# Patient Record
Sex: Male | Born: 1985 | Hispanic: No | Marital: Married | Smoking: Never smoker
Health system: Southern US, Community
[De-identification: ages and names within clinical notes are randomized; demographics above are authoritative.]

## PROBLEM LIST (undated history)

## (undated) DIAGNOSIS — I160 Hypertensive urgency: Secondary | ICD-10-CM

## (undated) DIAGNOSIS — E119 Type 2 diabetes mellitus without complications: Secondary | ICD-10-CM

## (undated) DIAGNOSIS — I10 Essential (primary) hypertension: Secondary | ICD-10-CM

## (undated) DIAGNOSIS — N471 Phimosis: Secondary | ICD-10-CM

## (undated) HISTORY — PX: TONSILLECTOMY: SUR1361

## (undated) HISTORY — PX: CIRCUMCISION: SUR203

---
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
## 2009-03-09 ENCOUNTER — Emergency Department (HOSPITAL_COMMUNITY): Admission: EM | Admit: 2009-03-09 | Discharge: 2009-03-09 | Payer: Self-pay | Admitting: Emergency Medicine

## 2011-03-07 LAB — DIFFERENTIAL
Basophils Relative: 1 % (ref 0–1)
Eosinophils Absolute: 0.1 10*3/uL (ref 0.0–0.7)
Lymphs Abs: 1.9 10*3/uL (ref 0.7–4.0)
Neutrophils Relative %: 77 % (ref 43–77)

## 2011-03-07 LAB — BASIC METABOLIC PANEL
BUN: 11 mg/dL (ref 6–23)
Chloride: 99 mEq/L (ref 96–112)
Creatinine, Ser: 1 mg/dL (ref 0.4–1.5)

## 2011-03-07 LAB — URINALYSIS, ROUTINE W REFLEX MICROSCOPIC
Glucose, UA: NEGATIVE mg/dL
Protein, ur: 30 mg/dL — AB
Urobilinogen, UA: 1 mg/dL (ref 0.0–1.0)

## 2011-03-07 LAB — URINE MICROSCOPIC-ADD ON

## 2011-03-07 LAB — CBC
MCV: 87 fL (ref 78.0–100.0)
Platelets: 174 10*3/uL (ref 150–400)
WBC: 11.4 10*3/uL — ABNORMAL HIGH (ref 4.0–10.5)

## 2016-12-31 ENCOUNTER — Ambulatory Visit: Payer: Self-pay | Admitting: Endocrinology

## 2017-04-03 ENCOUNTER — Emergency Department (HOSPITAL_COMMUNITY)
Admission: EM | Admit: 2017-04-03 | Discharge: 2017-04-03 | Disposition: A | Discharge status: Home / Self Care | Payer: Self-pay | Attending: Emergency Medicine | Admitting: Emergency Medicine

## 2017-04-03 ENCOUNTER — Encounter (HOSPITAL_COMMUNITY): Payer: Self-pay | Admitting: Emergency Medicine

## 2017-04-03 DIAGNOSIS — R739 Hyperglycemia, unspecified: Secondary | ICD-10-CM

## 2017-04-03 DIAGNOSIS — N471 Phimosis: Secondary | ICD-10-CM | POA: Insufficient documentation

## 2017-04-03 DIAGNOSIS — E1165 Type 2 diabetes mellitus with hyperglycemia: Secondary | ICD-10-CM | POA: Insufficient documentation

## 2017-04-03 DIAGNOSIS — I10 Essential (primary) hypertension: Secondary | ICD-10-CM | POA: Insufficient documentation

## 2017-04-03 HISTORY — DX: Essential (primary) hypertension: I10

## 2017-04-03 HISTORY — DX: Type 2 diabetes mellitus without complications: E11.9

## 2017-04-03 LAB — CBG MONITORING, ED
Glucose-Capillary: 344 mg/dL — ABNORMAL HIGH (ref 65–99)
Glucose-Capillary: 310 mg/dL — ABNORMAL HIGH (ref 65–99)

## 2017-04-03 MED ORDER — BETAMETHASONE DIPROPIONATE 0.05 % EX OINT: TOPICAL_OINTMENT | Freq: Two times a day (BID) | CUTANEOUS | 0 refills | Status: DC

## 2017-04-03 MED ORDER — METFORMIN HCL 500 MG PO TABS: 500.0000 mg | ORAL_TABLET | Freq: Two times a day (BID) | ORAL | 0 refills | Status: DC

## 2017-04-03 NOTE — ED Provider Notes (Signed)
[facility]-EMERGENCY DEPT Provider Note   CSN: [id number] Arrival date & time: 04/03/17  1738     History   Chief Complaint Chief Complaint  Patient presents with  . Penile Discharge    HPI Anthony Hubbard is a 31 y.o. male.  31 year old male with history of hypertension as well as type 2 diabetes, noncompliant with her diabetic diet, presents with long-standing history of pain to his foreskin. He is uncircumcised and denies any dysuria. No fever or chills. Denies any purulent drainage. Is able to pass urine without difficult to. Has had some polyuria and polydipsia. Denies any weakness.      Past Medical History:  Diagnosis Date  . Diabetes mellitus without complication (HCC)    type 2   . Hypertension     There are no active problems to display for this patient.   Past Surgical History:  Procedure Laterality Date  . TONSILLECTOMY         Home Medications    Prior to Admission medications   Not on File    Family History No family history on file.  Social History Social History  Substance Use Topics  . Smoking status: Never Smoker  . Smokeless tobacco: Never Used  . Alcohol use No     Allergies   Patient has no known allergies.   Review of Systems Review of Systems  All other systems reviewed and are negative.    Physical Exam Updated Vital Signs BP (!) 154/101 (BP Location: Right Arm)   Pulse 99   Temp 98.8 F (37.1 C) (Oral)   Resp 18   Ht 5\' 9"  (1.753 m)   Wt 101.6 kg   SpO2 97%   BMI 33.08 kg/m   Physical Exam  Constitutional: He is oriented to person, place, and time. He appears well-developed and well-nourished.  Non-toxic appearance. No distress.  HENT:  Head: Normocephalic and atraumatic.  Eyes: Conjunctivae, EOM and lids are normal. Pupils are equal, round, and reactive to light.  Neck: Normal range of motion. Neck supple. No tracheal deviation present. No thyroid mass present.  Cardiovascular: Normal rate, regular rhythm and  normal heart sounds.  Exam reveals no gallop.   No murmur heard. Pulmonary/Chest: Effort normal and breath sounds normal. No stridor. No respiratory distress. He has no decreased breath sounds. He has no wheezes. He has no rhonchi. He has no rales.  Abdominal: Soft. Normal appearance and bowel sounds are normal. He exhibits no distension. There is no tenderness. There is no rebound and no CVA tenderness.  Genitourinary: Uncircumcised. Phimosis and penile tenderness present. No penile erythema. No discharge found.  Musculoskeletal: Normal range of motion. He exhibits no edema or tenderness.  Neurological: He is alert and oriented to person, place, and time. He has normal strength. No cranial nerve deficit or sensory deficit. GCS eye subscore is 4. GCS verbal subscore is 5. GCS motor subscore is 6.  Skin: Skin is warm and dry. No abrasion and no rash noted.  Psychiatric: He has a normal mood and affect. His speech is normal and behavior is normal.  Nursing note and vitals reviewed.    ED Treatments / Results  Labs (all labs ordered are listed, but only abnormal results are displayed) Labs Reviewed  CBG MONITORING, ED - Abnormal; Notable for the following:       Result Value   Glucose-Capillary 344 (*)    All other components within normal limits  CBG MONITORING, ED - Abnormal; Notable for the following:  Glucose-Capillary 310 (*)    All other components within normal limits    EKG  EKG Interpretation None       Radiology No results found.  Procedures Procedures (including critical care time)  Medications Ordered in ED Medications - No data to display   Initial Impression / Assessment and Plan / ED Course  I have reviewed the triage vital signs and the nursing notes.  Pertinent labs & imaging results that were available during my care of the patient were reviewed by me and considered in my medical decision making (see chart for details).     Patient evidence of  phimosis. Will prescribe betamethasone cream and give urological referral. We'll also adjust patient's dose of his metformin because his hyperglycemia and patient counseled on proper diabetic diet.  Final Clinical Impressions(s) / ED Diagnoses   Final diagnoses:  None    New Prescriptions New Prescriptions   No medications on file     Lorre NickAllen, Bodin Gorka, MD 04/03/17 2157

## 2017-04-03 NOTE — ED Triage Notes (Signed)
Pt c/o white discharge progressed to red-green discharge from underneath foreskin, itch, with small bleeding cuts to distal edge of foreskin x several months, foreskin rips slightly when pt tries to retract it for cleaning. Last sexual intercourse 2 years ago. No injury to penis. No dysuria except burning where urine touches open skin.

## 2017-06-12 DIAGNOSIS — E1169 Type 2 diabetes mellitus with other specified complication: Secondary | ICD-10-CM | POA: Insufficient documentation

## 2017-11-20 DIAGNOSIS — E781 Pure hyperglyceridemia: Secondary | ICD-10-CM | POA: Insufficient documentation

## 2019-06-02 ENCOUNTER — Emergency Department (HOSPITAL_BASED_OUTPATIENT_CLINIC_OR_DEPARTMENT_OTHER): Payer: Self-pay

## 2019-06-02 ENCOUNTER — Other Ambulatory Visit: Payer: Self-pay

## 2019-06-02 ENCOUNTER — Emergency Department (HOSPITAL_COMMUNITY)
Admission: EM | Admit: 2019-06-02 | Discharge: 2019-06-02 | Disposition: A | Discharge status: Home / Self Care | Payer: Self-pay | Attending: Emergency Medicine | Admitting: Emergency Medicine

## 2019-06-02 ENCOUNTER — Encounter (HOSPITAL_COMMUNITY): Payer: Self-pay

## 2019-06-02 ENCOUNTER — Emergency Department (HOSPITAL_COMMUNITY): Payer: Self-pay

## 2019-06-02 DIAGNOSIS — I10 Essential (primary) hypertension: Secondary | ICD-10-CM | POA: Insufficient documentation

## 2019-06-02 DIAGNOSIS — Z9114 Patient's other noncompliance with medication regimen: Secondary | ICD-10-CM | POA: Insufficient documentation

## 2019-06-02 DIAGNOSIS — E119 Type 2 diabetes mellitus without complications: Secondary | ICD-10-CM | POA: Insufficient documentation

## 2019-06-02 DIAGNOSIS — R52 Pain, unspecified: Secondary | ICD-10-CM

## 2019-06-02 DIAGNOSIS — Z7984 Long term (current) use of oral hypoglycemic drugs: Secondary | ICD-10-CM | POA: Insufficient documentation

## 2019-06-02 DIAGNOSIS — L03116 Cellulitis of left lower limb: Secondary | ICD-10-CM

## 2019-06-02 DIAGNOSIS — M79605 Pain in left leg: Secondary | ICD-10-CM

## 2019-06-02 DIAGNOSIS — M7989 Other specified soft tissue disorders: Secondary | ICD-10-CM

## 2019-06-02 LAB — COMPREHENSIVE METABOLIC PANEL
ALT: 27 U/L (ref 0–44)
AST: 14 U/L — ABNORMAL LOW (ref 15–41)
Albumin: 4.3 g/dL (ref 3.5–5.0)
Alkaline Phosphatase: 89 U/L (ref 38–126)
Anion gap: 13 (ref 5–15)
BUN: 13 mg/dL (ref 6–20)
CO2: 23 mmol/L (ref 22–32)
Calcium: 9.5 mg/dL (ref 8.9–10.3)
Chloride: 99 mmol/L (ref 98–111)
Creatinine, Ser: 0.91 mg/dL (ref 0.61–1.24)
GFR calc Af Amer: >60 mL/min (ref >60)
GFR calc non Af Amer: >60 mL/min (ref >60)
Glucose, Bld: 352 mg/dL — ABNORMAL HIGH (ref 70–99)
Potassium: 4.2 mmol/L (ref 3.5–5.1)
Sodium: 135 mmol/L (ref 135–145)
Total Bilirubin: 0.7 mg/dL (ref 0.3–1.2)
Total Protein: 8.4 g/dL — ABNORMAL HIGH (ref 6.5–8.1)

## 2019-06-02 LAB — CBC WITH DIFFERENTIAL/PLATELET
Abs Immature Granulocytes: 0.05 10*3/uL (ref 0.00–0.07)
Basophils Absolute: 0 10*3/uL (ref 0.0–0.1)
Basophils Relative: 0 %
Eosinophils Absolute: 0.1 10*3/uL (ref 0.0–0.5)
Eosinophils Relative: 1 %
HCT: 53.2 % — ABNORMAL HIGH (ref 39.0–52.0)
Hemoglobin: 17.4 g/dL — ABNORMAL HIGH (ref 13.0–17.0)
Immature Granulocytes: 0 %
Lymphocytes Relative: 23 %
Lymphs Abs: 2.7 10*3/uL (ref 0.7–4.0)
MCH: 28.2 pg (ref 26.0–34.0)
MCHC: 32.7 g/dL (ref 30.0–36.0)
MCV: 86.4 fL (ref 80.0–100.0)
Monocytes Absolute: 0.8 10*3/uL (ref 0.1–1.0)
Monocytes Relative: 7 %
Neutro Abs: 7.9 10*3/uL — ABNORMAL HIGH (ref 1.7–7.7)
Neutrophils Relative %: 69 %
Platelets: 230 10*3/uL (ref 150–400)
RBC: 6.16 MIL/uL — ABNORMAL HIGH (ref 4.22–5.81)
RDW: 12.5 % (ref 11.5–15.5)
WBC: 11.6 10*3/uL — ABNORMAL HIGH (ref 4.0–10.5)
nRBC: 0 % (ref 0.0–0.2)

## 2019-06-02 MED ORDER — GADOBUTROL 1 MMOL/ML IV SOLN
10.0000 mL | Freq: Once | INTRAVENOUS | Status: AC | PRN
  Administered 2019-06-02: 10 mL via INTRAVENOUS

## 2019-06-02 MED ORDER — MORPHINE SULFATE (PF) 4 MG/ML IV SOLN
4.0000 mg | Freq: Once | INTRAVENOUS | Status: AC
  Administered 2019-06-02: 4 mg via INTRAVENOUS
  Filled 2019-06-02: qty 1

## 2019-06-02 MED ORDER — OXYCODONE-ACETAMINOPHEN 5-325 MG PO TABS: 1.0000 | ORAL_TABLET | Freq: Four times a day (QID) | ORAL | 0 refills | Status: DC | PRN

## 2019-06-02 MED ORDER — SODIUM CHLORIDE 0.9 % IV BOLUS
1000.0000 mL | Freq: Once | INTRAVENOUS | Status: AC
  Administered 2019-06-02: 1000 mL via INTRAVENOUS

## 2019-06-02 MED ORDER — ONDANSETRON HCL 4 MG/2ML IJ SOLN
4.0000 mg | Freq: Once | INTRAMUSCULAR | Status: AC
  Administered 2019-06-02: 15:00:00 4 mg via INTRAVENOUS
  Filled 2019-06-02: qty 2

## 2019-06-02 NOTE — ED Provider Notes (Addendum)
[facility]-EMERGENCY DEPT Provider Note   CSN: [id number] Arrival date & time: 06/02/19  1220   History   Chief Complaint Chief Complaint  Patient presents with  . Leg Pain    HPI Anthony Hubbard is a 33 y.o. male with past history significant for type 2 diabetes who is noncompliant with his medications and hypertension who presents for evaluation of left lower extremity swelling and warmth.  This began proximally 1 month ago.  Patient states originally he had a knot to his medial left gastroc.  Patient states this not resolved however then he developed a diffuse rash which resemble petechiae.  Patient states that then resolved however he developed diffuse bruising as well as tenderness.  Patient states the bruising has dissipated however the area has become erythematous and mildly warm.  He denies prior history of DVT or PE.  Denies fever, chills, nausea, vomiting, chest pain, shortness of breath, abdominal pain, diarrhea, dysuria, numbness or tingling in his extremities.  Patient states he has some mild pain to his medial belly of the left calf however no posterior calf tenderness.  Denies any recent injuries or trauma.  Has been taken 1 dose of Augmentin yesterday which was prescribed by the diabetes clinic.  Is been taking Tylenol and ibuprofen without relief of symptoms. Has had pain with ambulation to LLE.  History obtained from patient and past medical records.  No interpreter was used.     HPI  Past Medical History:  Diagnosis Date  . Diabetes mellitus without complication (HCC)    type 2   . Hypertension     There are no active problems to display for this patient.   Past Surgical History:  Procedure Laterality Date  . TONSILLECTOMY          Home Medications    Prior to Admission medications   Medication Sig Start Date End Date Taking? Authorizing Provider  betamethasone dipropionate (DIPROLENE) 0.05 % ointment Apply topically 2 (two) times  daily. 04/03/17   Lorre NickAllen, Anthony, MD  metFORMIN (GLUCOPHAGE) 500 MG tablet Take 1 tablet (500 mg total) by mouth 2 (two) times daily. Take 1 tablet in the morning and 2 tablets in the evening 04/03/17   Lorre NickAllen, Anthony, MD    Family History No family history on file.  Social History Social History   Tobacco Use  . Smoking status: Never Smoker  . Smokeless tobacco: Never Used  Substance Use Topics  . Alcohol use: No  . Drug use: Yes    Types: Marijuana     Allergies   Patient has no known allergies.   Review of Systems Review of Systems  Constitutional: Negative.   HENT: Negative.   Respiratory: Negative.   Cardiovascular: Negative.   Gastrointestinal: Negative.   Genitourinary: Negative.   Musculoskeletal: Negative for back pain and gait problem.       LLE pain and swelling  Skin: Positive for rash.  Neurological: Negative.   All other systems reviewed and are negative.   Physical Exam Updated Vital Signs BP (!) 186/113 (BP Location: Right Arm)   Pulse 83   Temp 99.4 F (37.4 C) (Oral)   Resp 16   SpO2 98%   Physical Exam Vitals signs and nursing note reviewed.  Constitutional:      General: He is not in acute distress.    Appearance: He is not ill-appearing, toxic-appearing or diaphoretic.  HENT:     Head: Normocephalic and atraumatic.     Jaw: There  is normal jaw occlusion.     Nose: Nose normal.     Comments: Clear rhinorrhea and congestion to bilateral nares.  No sinus tenderness.    Mouth/Throat:     Mouth: Mucous membranes are moist.     Pharynx: Oropharynx is clear.     Comments: Posterior oropharynx clear.  Mucous membranes moist.  Tonsils without erythema or exudate.  Uvula midline without deviation.  No evidence of PTA or RPA.  No drooling, dysphasia or trismus.  Phonation normal. Neck:     Trachea: Trachea and phonation normal.     Meningeal: Brudzinski's sign and Kernig's sign absent.     Comments: No Neck stiffness or neck rigidity.  No  meningismus.  No cervical lymphadenopathy. Cardiovascular:     Pulses:          Dorsalis pedis pulses are 2+ on the right side and 2+ on the left side.       Posterior tibial pulses are 2+ on the right side and 2+ on the left side.     Comments: No murmurs rubs or gallops. Pulmonary:     Comments: Clear to auscultation bilaterally without wheeze, rhonchi or rales.  No accessory muscle usage.  Able speak in full sentences. Abdominal:     Comments: Soft, nontender without rebound or guarding.  No CVA tenderness.  Musculoskeletal:     Comments: Noticed to medial gastroc to left lower extremity.  There is mild surrounding warmth.  Patient able to plantarflex and dorsiflex however with mild pain.  No pain to posterior calf however tenderness to medial belly.  Mild surrounding erythema extending from foot to wound.  There is area of induration without fluctuance.  No bony step-offs or crepitus.  He has 2+ DP, PT pulses bilaterally.  Left lower extremity with full range of motion without any skin changes.  Bilateral lower extremity compartments soft.  Skin:    Comments: Brisk capillary refill.  Erythema with mild warmth to medial belly of left gastroc.  Mild induration without fluctuance.  No evidence of obvious drainable abscess.  Neurological:     Mental Status: He is alert.     Comments: Ambulatory in department without difficulty.  Cranial nerves II through XII grossly intact.  No facial droop.  No aphasia.      ED Treatments / Results  Labs (all labs ordered are listed, but only abnormal results are displayed) Labs Reviewed  CBC WITH DIFFERENTIAL/PLATELET - Abnormal; Notable for the following components:      Result Value   WBC 11.6 (*)    RBC 6.16 (*)    Hemoglobin 17.4 (*)    HCT 53.2 (*)    Neutro Abs 7.9 (*)    All other components within normal limits  COMPREHENSIVE METABOLIC PANEL    EKG None  Radiology No results found.  Procedures   EMERGENCY DEPARTMENT US SOFT  TISSUE INTERPRETATION "Study: Limited Soft Tissue Ultrasound"  INDICATIONS: Pain Multiple views of the body part were obtained in real-time with a multi-frequency linear probe  PERFORMED BY: Myself IMAGES ARCHIVED?: Yes SIDE:Left BODY PART:Lower extremity INTERPRETATION:  Cellulitis present   Procedures (including critical care time)  Medications Ordered in ED Medications  morphine 4 MG/ML injection 4 mg (4 mg Intravenous Given 06/02/19 1519)  sodium chloride 0.9 % bolus 1,000 mL (1,000 mLs Intravenous New Bag/Given 06/02/19 1517)  ondansetron (ZOFRAN) injection 4 mg (4 mg Intravenous Given 06/02/19 1518)    Initial Impression / Assessment and Plan / ED Course  I have reviewed the triage vital signs and the nursing notes.  Pertinent labs & imaging results that were available during my care of the patient were reviewed by me and considered in my medical decision making (see chart for details).   33 year old male appears otherwise well presents for evaluation of left lower extremity pain.  He is afebrile, nonseptic, non-ill-appearing.  Patient is noncompliant with his diabetes medicines.  Patient has had evolving pain, swelling and ecchymosis over the last month to his left medial belly of his calf.  He has 2+, DP pulses bilaterally.  His compartments are soft.  He has no bony tenderness.  Ultrasound performed does not show any evidence of drainable abscess.  He does have some mild cobblestoning to the area.  Concern for possible hematoma vs infectious process vs possible ligament or tendon injury.  No systemic symptoms to indicate Sirs or sepsis.  We will plan to obtain basic labs as well as MRI with contrast and [place] to r/o DVT as cause of his pain.   Care transferred to Dr. Melina Copa who will follow up on labs, ultrasound and MRI.  If negative likely DC home to continue antibiotics prescribed by previous provider.  Patient seen and evaluated by my attending physician, Dr. Vanita Panda who agrees  with above treatment and plan. Final Clinical Impressions(s) / ED Diagnoses   Final diagnoses:  Left leg pain    ED Discharge Orders    None       Shadow Stiggers A, PA-C 06/02/19 1549    Yunique Dearcos A, PA-C 06/02/19 1613    Carmin Muskrat, MD 06/08/19 1400

## 2019-06-02 NOTE — Progress Notes (Signed)
Left lower extremity venous duplex completed. Refer to "CV Proc" under chart review to view preliminary results.  Preliminary results discussed with Britni, PA-C.  06/02/2019 4:24 PM Maudry Mayhew, MHA, RVT, RDCS, RDMS

## 2019-06-02 NOTE — ED Provider Notes (Signed)
Signout from [place].  33 year old diabetic male with 1 month of left calf pain swelling bruising.  He was just started on antibiotics Augmentin is just had 1 dose.  No other systemic symptoms. Physical Exam  BP (!) 186/113 (BP Location: Right Arm)   Pulse 83   Temp 99.4 F (37.4 C) (Oral)   Resp 16   SpO2 98%   Physical Exam  ED Course/Procedures   Clinical Course as of Jun 01 2329  Tue Jun 02, 2019  1932 Patient's MRI read and shows concerns for cellulitis and may be myositis, they said fasciitis was also possibility although they are not seeing any signs of air.  The fact that this is been going on for almost a month makes me think that fasciitis is less likely.  He is only had 1 dose of antibiotics.  He is a diabetic but otherwise is very nontoxic-appearing.  Pain seems to be his main complaint.  Will cover with some pain medicine and have him take his antibiotics.  Muscle again given the number for orthopedics for follow-up but I given clear instructions to return if any worsening symptoms.   [MB]    Clinical Course User Index [MB] Hayden Rasmussen, MD    Procedures  MDM  Plan is to follow-up on labs and MRI of the lower leg.  If no significant findings can be discharged to continue on antibiotics and follow-up with diabetic clinic.       Hayden Rasmussen, MD 06/02/19 [phone]

## 2019-06-02 NOTE — Discharge Instructions (Signed)
You were seen in the emergency department for 1 month of increased pain and swelling of your left leg.  You had blood work, ultrasound, and an MRI of your leg.  It still not clear exactly what is causing her symptoms but there is at least some evidence of an infection.  Please continue your antibiotics.  It will be important for you to follow-up with orthopedics and please return if any worsening symptoms.

## 2019-06-02 NOTE — ED Triage Notes (Signed)
Pt states since June 13, pt had charlie horse in left leg. Pt states that he has bruising.  Pt states that has since gone away. Pt states he has a large bump on his left lower leg. Pt went to diabetic clinic, who told him it was an infection. Pt was given rx for abx 2 days ago.  Pt had x rays done at that time and they were negative.

## 2019-06-02 NOTE — ED Notes (Signed)
Patient transported to MRI 

## 2019-06-07 ENCOUNTER — Inpatient Hospital Stay (HOSPITAL_COMMUNITY)
Admission: EM | Admit: 2019-06-07 | Discharge: 2019-06-15 | DRG: 854 | Disposition: A | Discharge status: Home / Self Care | Payer: Self-pay | Attending: Internal Medicine | Admitting: Internal Medicine

## 2019-06-07 ENCOUNTER — Other Ambulatory Visit: Payer: Self-pay

## 2019-06-07 ENCOUNTER — Encounter (HOSPITAL_COMMUNITY): Payer: Self-pay | Admitting: Emergency Medicine

## 2019-06-07 DIAGNOSIS — L03116 Cellulitis of left lower limb: Secondary | ICD-10-CM | POA: Diagnosis present

## 2019-06-07 DIAGNOSIS — Z833 Family history of diabetes mellitus: Secondary | ICD-10-CM

## 2019-06-07 DIAGNOSIS — E1165 Type 2 diabetes mellitus with hyperglycemia: Secondary | ICD-10-CM

## 2019-06-07 DIAGNOSIS — A419 Sepsis, unspecified organism: Principal | ICD-10-CM | POA: Diagnosis present

## 2019-06-07 DIAGNOSIS — Z20828 Contact with and (suspected) exposure to other viral communicable diseases: Secondary | ICD-10-CM

## 2019-06-07 DIAGNOSIS — Z79891 Long term (current) use of opiate analgesic: Secondary | ICD-10-CM

## 2019-06-07 DIAGNOSIS — M60004 Infective myositis, unspecified left leg: Secondary | ICD-10-CM

## 2019-06-07 DIAGNOSIS — M7989 Other specified soft tissue disorders: Secondary | ICD-10-CM | POA: Diagnosis present

## 2019-06-07 DIAGNOSIS — I160 Hypertensive urgency: Secondary | ICD-10-CM

## 2019-06-07 DIAGNOSIS — Z794 Long term (current) use of insulin: Secondary | ICD-10-CM

## 2019-06-07 DIAGNOSIS — M60009 Infective myositis, unspecified site: Secondary | ICD-10-CM | POA: Diagnosis present

## 2019-06-07 DIAGNOSIS — Z791 Long term (current) use of non-steroidal anti-inflammatories (NSAID): Secondary | ICD-10-CM

## 2019-06-07 DIAGNOSIS — Z7982 Long term (current) use of aspirin: Secondary | ICD-10-CM

## 2019-06-07 DIAGNOSIS — Z7984 Long term (current) use of oral hypoglycemic drugs: Secondary | ICD-10-CM

## 2019-06-07 DIAGNOSIS — I82819 Embolism and thrombosis of superficial veins of unspecified lower extremities: Secondary | ICD-10-CM | POA: Diagnosis present

## 2019-06-07 DIAGNOSIS — M729 Fibroblastic disorder, unspecified: Secondary | ICD-10-CM | POA: Diagnosis present

## 2019-06-07 DIAGNOSIS — L02416 Cutaneous abscess of left lower limb: Secondary | ICD-10-CM | POA: Diagnosis present

## 2019-06-07 DIAGNOSIS — I10 Essential (primary) hypertension: Secondary | ICD-10-CM

## 2019-06-07 HISTORY — DX: Phimosis: N47.1

## 2019-06-07 HISTORY — DX: Hypertensive urgency: I16.0

## 2019-06-07 LAB — SEDIMENTATION RATE: Sed Rate: 74 mm/hr — ABNORMAL HIGH (ref 0–16)

## 2019-06-07 LAB — COMPREHENSIVE METABOLIC PANEL WITH GFR
ALT: 44 U/L (ref 0–44)
AST: 31 U/L (ref 15–41)
Albumin: 4.6 g/dL (ref 3.5–5.0)
Alkaline Phosphatase: 121 U/L (ref 38–126)
Anion gap: 15 (ref 5–15)
BUN: 21 mg/dL — ABNORMAL HIGH (ref 6–20)
CO2: 25 mmol/L (ref 22–32)
Calcium: 10.2 mg/dL (ref 8.9–10.3)
Chloride: 95 mmol/L — ABNORMAL LOW (ref 98–111)
Creatinine, Ser: 1.16 mg/dL (ref 0.61–1.24)
GFR calc Af Amer: >60 mL/min
GFR calc non Af Amer: >60 mL/min
Glucose, Bld: 355 mg/dL — ABNORMAL HIGH (ref 70–99)
Potassium: 4.6 mmol/L (ref 3.5–5.1)
Sodium: 135 mmol/L (ref 135–145)
Total Bilirubin: 0.7 mg/dL (ref 0.3–1.2)
Total Protein: 9.9 g/dL — ABNORMAL HIGH (ref 6.5–8.1)

## 2019-06-07 LAB — CBC WITH DIFFERENTIAL/PLATELET
Abs Immature Granulocytes: 0.06 K/uL (ref 0.00–0.07)
Basophils Absolute: 0.1 K/uL (ref 0.0–0.1)
Basophils Relative: 0 %
Eosinophils Absolute: 0.1 K/uL (ref 0.0–0.5)
Eosinophils Relative: 1 %
HCT: 56.7 % — ABNORMAL HIGH (ref 39.0–52.0)
Hemoglobin: 18.5 g/dL — ABNORMAL HIGH (ref 13.0–17.0)
Immature Granulocytes: 0 %
Lymphocytes Relative: 21 %
Lymphs Abs: 3 K/uL (ref 0.7–4.0)
MCH: 27.9 pg (ref 26.0–34.0)
MCHC: 32.6 g/dL (ref 30.0–36.0)
MCV: 85.6 fL (ref 80.0–100.0)
Monocytes Absolute: 1 K/uL (ref 0.1–1.0)
Monocytes Relative: 7 %
Neutro Abs: 10.4 K/uL — ABNORMAL HIGH (ref 1.7–7.7)
Neutrophils Relative %: 71 %
Platelets: 369 K/uL (ref 150–400)
RBC: 6.62 MIL/uL — ABNORMAL HIGH (ref 4.22–5.81)
RDW: 12.1 % (ref 11.5–15.5)
WBC: 14.6 K/uL — ABNORMAL HIGH (ref 4.0–10.5)
nRBC: 0 % (ref 0.0–0.2)

## 2019-06-07 LAB — CK: Total CK: 74 U/L (ref 49–397)

## 2019-06-07 LAB — GLUCOSE, CAPILLARY: Glucose-Capillary: 287 mg/dL — ABNORMAL HIGH (ref 70–99)

## 2019-06-07 LAB — LACTIC ACID, PLASMA: Lactic Acid, Venous: 1.5 mmol/L (ref 0.5–1.9)

## 2019-06-07 LAB — C-REACTIVE PROTEIN: CRP: 18.9 mg/dL — ABNORMAL HIGH (ref <1.0)

## 2019-06-07 LAB — SARS CORONAVIRUS 2 BY RT PCR (HOSPITAL ORDER, PERFORMED IN ~~LOC~~ HOSPITAL LAB): SARS Coronavirus 2: NEGATIVE

## 2019-06-07 MED ORDER — PIPERACILLIN-TAZOBACTAM 3.375 G IVPB 30 MIN
3.3750 g | Freq: Once | INTRAVENOUS | Status: AC
  Administered 2019-06-07: 3.375 g via INTRAVENOUS
  Filled 2019-06-07: qty 50

## 2019-06-07 MED ORDER — KETOROLAC TROMETHAMINE 30 MG/ML IJ SOLN
30.0000 mg | Freq: Four times a day (QID) | INTRAMUSCULAR | Status: AC | PRN
  Administered 2019-06-07 – 2019-06-12 (×7): 30 mg via INTRAVENOUS
  Filled 2019-06-07 (×7): qty 1

## 2019-06-07 MED ORDER — MORPHINE SULFATE (PF) 4 MG/ML IV SOLN
4.0000 mg | Freq: Once | INTRAVENOUS | Status: AC
  Administered 2019-06-07: 4 mg via INTRAVENOUS
  Filled 2019-06-07: qty 1

## 2019-06-07 MED ORDER — INSULIN ASPART 100 UNIT/ML ~~LOC~~ SOLN
0.0000 [IU] | Freq: Every day | SUBCUTANEOUS | Status: DC
  Administered 2019-06-10: 22:00:00 3 [IU] via SUBCUTANEOUS
  Administered 2019-06-11: 21:00:00 4 [IU] via SUBCUTANEOUS
  Filled 2019-06-07: qty 0.05

## 2019-06-07 MED ORDER — SODIUM CHLORIDE 0.9 % IV SOLN
2.0000 g | Freq: Every day | INTRAVENOUS | Status: DC
  Administered 2019-06-08 – 2019-06-11 (×4): 2 g via INTRAVENOUS
  Filled 2019-06-07: qty 20
  Filled 2019-06-07 (×4): qty 2

## 2019-06-07 MED ORDER — INSULIN NPH (HUMAN) (ISOPHANE) 100 UNIT/ML ~~LOC~~ SUSP
10.0000 [IU] | Freq: Two times a day (BID) | SUBCUTANEOUS | Status: DC
  Administered 2019-06-08 (×2): 10 [IU] via SUBCUTANEOUS
  Filled 2019-06-07: qty 10

## 2019-06-07 MED ORDER — OXYCODONE HCL 5 MG PO TABS
5.0000 mg | ORAL_TABLET | ORAL | Status: DC | PRN
  Administered 2019-06-07 – 2019-06-11 (×15): 5 mg via ORAL
  Filled 2019-06-07 (×15): qty 1

## 2019-06-07 MED ORDER — ONDANSETRON HCL 4 MG/2ML IJ SOLN: 4.0000 mg | Freq: Four times a day (QID) | INTRAMUSCULAR | Status: DC | PRN

## 2019-06-07 MED ORDER — HYDROCHLOROTHIAZIDE 12.5 MG PO CAPS
12.5000 mg | ORAL_CAPSULE | Freq: Every day | ORAL | Status: DC
  Administered 2019-06-08 – 2019-06-15 (×8): 12.5 mg via ORAL
  Filled 2019-06-07 (×8): qty 1

## 2019-06-07 MED ORDER — SODIUM CHLORIDE 0.9% FLUSH
3.0000 mL | Freq: Once | INTRAVENOUS | Status: AC
  Administered 2019-06-07: 3 mL via INTRAVENOUS

## 2019-06-07 MED ORDER — ENOXAPARIN SODIUM 40 MG/0.4ML ~~LOC~~ SOLN
40.0000 mg | Freq: Every day | SUBCUTANEOUS | Status: DC
  Administered 2019-06-08 – 2019-06-09 (×2): 40 mg via SUBCUTANEOUS
  Filled 2019-06-07 (×2): qty 0.4

## 2019-06-07 MED ORDER — ACETAMINOPHEN 650 MG RE SUPP: 650.0000 mg | Freq: Four times a day (QID) | RECTAL | Status: DC | PRN

## 2019-06-07 MED ORDER — SODIUM CHLORIDE 0.9 % IV SOLN
INTRAVENOUS | Status: AC
  Administered 2019-06-08: via INTRAVENOUS

## 2019-06-07 MED ORDER — HYDROMORPHONE HCL 1 MG/ML IJ SOLN
1.0000 mg | Freq: Once | INTRAMUSCULAR | Status: AC
  Administered 2019-06-07: 1 mg via INTRAVENOUS
  Filled 2019-06-07: qty 1

## 2019-06-07 MED ORDER — SODIUM CHLORIDE 0.9 % IV BOLUS
1000.0000 mL | Freq: Once | INTRAVENOUS | Status: AC
  Administered 2019-06-07: 1000 mL via INTRAVENOUS

## 2019-06-07 MED ORDER — ONDANSETRON HCL 4 MG/2ML IJ SOLN
4.0000 mg | Freq: Once | INTRAMUSCULAR | Status: AC
  Administered 2019-06-07: 4 mg via INTRAVENOUS
  Filled 2019-06-07: qty 2

## 2019-06-07 MED ORDER — METRONIDAZOLE IN NACL 5-0.79 MG/ML-% IV SOLN
500.0000 mg | Freq: Three times a day (TID) | INTRAVENOUS | Status: DC
  Administered 2019-06-08 – 2019-06-11 (×10): 500 mg via INTRAVENOUS
  Filled 2019-06-07 (×11): qty 100

## 2019-06-07 MED ORDER — INSULIN ASPART 100 UNIT/ML ~~LOC~~ SOLN
0.0000 [IU] | Freq: Three times a day (TID) | SUBCUTANEOUS | Status: DC
  Administered 2019-06-08: 5 [IU] via SUBCUTANEOUS
  Administered 2019-06-08: 08:00:00 11 [IU] via SUBCUTANEOUS
  Administered 2019-06-08: 5 [IU] via SUBCUTANEOUS
  Administered 2019-06-09: 8 [IU] via SUBCUTANEOUS
  Administered 2019-06-09 – 2019-06-11 (×5): 3 [IU] via SUBCUTANEOUS
  Administered 2019-06-11: 13:00:00 8 [IU] via SUBCUTANEOUS
  Administered 2019-06-11: 08:00:00 11 [IU] via SUBCUTANEOUS
  Administered 2019-06-12 (×2): 3 [IU] via SUBCUTANEOUS
  Administered 2019-06-12 – 2019-06-13 (×2): 5 [IU] via SUBCUTANEOUS
  Administered 2019-06-13: 13:00:00 2 [IU] via SUBCUTANEOUS
  Administered 2019-06-13: 08:00:00 5 [IU] via SUBCUTANEOUS
  Administered 2019-06-14: 18:00:00 2 [IU] via SUBCUTANEOUS
  Administered 2019-06-14: 8 [IU] via SUBCUTANEOUS
  Administered 2019-06-15: 5 [IU] via SUBCUTANEOUS
  Filled 2019-06-07: qty 0.15

## 2019-06-07 MED ORDER — LOSARTAN POTASSIUM 50 MG PO TABS
50.0000 mg | ORAL_TABLET | Freq: Every day | ORAL | Status: DC
  Administered 2019-06-08 – 2019-06-09 (×3): 50 mg via ORAL
  Filled 2019-06-07 (×3): qty 1

## 2019-06-07 MED ORDER — ONDANSETRON HCL 4 MG PO TABS: 4.0000 mg | ORAL_TABLET | Freq: Four times a day (QID) | ORAL | Status: DC | PRN

## 2019-06-07 MED ORDER — ACETAMINOPHEN 325 MG PO TABS
650.0000 mg | ORAL_TABLET | Freq: Four times a day (QID) | ORAL | Status: DC | PRN
  Administered 2019-06-08 – 2019-06-11 (×7): 650 mg via ORAL
  Filled 2019-06-07 (×8): qty 2

## 2019-06-07 MED ORDER — VANCOMYCIN HCL IN DEXTROSE 1-5 GM/200ML-% IV SOLN
1000.0000 mg | Freq: Once | INTRAVENOUS | Status: AC
  Administered 2019-06-07: 1000 mg via INTRAVENOUS
  Filled 2019-06-07: qty 200

## 2019-06-07 NOTE — ED Notes (Signed)
Pt given urinal to provide urine specimen Pt will notify staff when specimen provided

## 2019-06-07 NOTE — ED Notes (Signed)
Pt states he does not take his prescribed blood pressure medications due to losing insurance.

## 2019-06-07 NOTE — ED Provider Notes (Signed)
[facility]-EMERGENCY DEPT Provider Note   CSN: [id number] Arrival date & time: 06/07/19  1705    History   Chief Complaint Chief Complaint  Patient presents with   Leg Pain   Leg Swelling    HPI Anthony Hubbard is a 33 y.o. male.     HPI   Anthony Hubbard is a 33 y.o. male, with a history of uncontrolled diabetes and uncontrolled hypertension, presenting to the ED with left leg pain, swelling, increased warmth, and bruising worsening over the last several days.   June 13 woke up with bruising, swelling, and pain to the left anterior lower leg. Lasted for a week, then improved.  Around June 23, began to have worsening pain, swelling, and bruising.  July 5: States he went to a "[facility] on [address]" and was prescribed 10 days of Augmentin.  He states he has been taking this, as prescribed.  July 7: Seen in the ED and underwent work up, found to have cellulitis and myositis on MRI, told to continue his previously prescribed ABX, and follow up with ortho. Has not made a follow up appt.   Pain and swelling has continued to worsen. Accompanied by intermittent subjective fever.   Denies injury/trauma, nausea/vomiting, numbness, weakness, shortness of breath, chest pain, abdominal pain, or any other complaints.   Past Medical History:  Diagnosis Date   Diabetes mellitus without complication (HCC)    type 2    Hypertension    Phimosis     Patient Active Problem List   Diagnosis Date Noted   Sepsis (HCC) 06/07/2019   Cellulitis and abscess of left leg 06/07/2019   Type 2 diabetes mellitus with hyperglycemia, with long-term current use of insulin (HCC) 06/07/2019   Essential hypertension 06/07/2019   Hypertensive urgency 06/07/2019    Past Surgical History:  Procedure Laterality Date   CIRCUMCISION     TONSILLECTOMY          Home Medications    Prior to Admission medications   Medication Sig Start Date End  Date Taking? Authorizing Provider  aspirin 325 MG EC tablet Take 325 mg by mouth daily as needed for pain.   Yes [provider]  meloxicam (MOBIC) 15 MG tablet Take 15 mg by mouth daily as needed for muscle spasms. 05/31/19  Yes [provider]  oxyCODONE-acetaminophen (PERCOCET/ROXICET) 5-325 MG tablet Take 1-2 tablets by mouth every 6 (six) hours as needed for severe pain. Patient taking differently: Take 1-2 tablets by mouth at bedtime as needed for moderate pain.  06/02/19  Yes Terrilee FilesButler, Michael C, MD  metFORMIN (GLUCOPHAGE) 500 MG tablet Take 1 tablet (500 mg total) by mouth 2 (two) times daily. Take 1 tablet in the morning and 2 tablets in the evening Patient not taking: Reported on 06/07/2019 04/03/17   Lorre NickAllen, Anthony, MD    Family History Family History  Problem Relation Age of Onset   Diabetes Father    Diabetes Paternal Uncle     Social History Social History   Tobacco Use   Smoking status: Never Smoker   Smokeless tobacco: Never Used  Substance Use Topics   Alcohol use: No   Drug use: Yes    Types: Marijuana     Allergies   Patient has no known allergies.   Review of Systems Review of Systems  Constitutional: Positive for fever.  Respiratory: Negative for cough and shortness of breath.   Cardiovascular: Negative for chest pain.  Gastrointestinal: Negative for abdominal  pain, nausea and vomiting.  Musculoskeletal: Positive for myalgias.  Skin: Positive for color change. Negative for wound.  Neurological: Negative for weakness and numbness.  All other systems reviewed and are negative.    Physical Exam Updated Vital Signs BP (!) 181/130 (BP Location: Left Arm)    Pulse (!) 123    Temp 98.7 F (37.1 C) (Oral)    Resp 19    SpO2 99%   Physical Exam Vitals signs and nursing note reviewed.  Constitutional:      General: He is not in acute distress.    Appearance: He is well-developed. He is not diaphoretic.  HENT:     Head: Normocephalic  and atraumatic.     Mouth/Throat:     Mouth: Mucous membranes are moist.     Pharynx: Oropharynx is clear.  Eyes:     Conjunctiva/sclera: Conjunctivae normal.  Neck:     Musculoskeletal: Neck supple.  Cardiovascular:     Rate and Rhythm: Regular rhythm. Tachycardia present.     Pulses: Normal pulses.          Radial pulses are 2+ on the right side and 2+ on the left side.       Posterior tibial pulses are 2+ on the right side and 2+ on the left side.     Heart sounds: Normal heart sounds.     Comments: Tactile temperature in the extremities appropriate and equal bilaterally. Pulmonary:     Effort: Pulmonary effort is normal. No respiratory distress.     Breath sounds: Normal breath sounds.  Abdominal:     Palpations: Abdomen is soft.     Tenderness: There is no abdominal tenderness. There is no guarding.  Musculoskeletal:     Right lower leg: No edema.     Left lower leg: He exhibits tenderness and swelling.     Comments: Exquisite tenderness to left anterior and medial lower leg around midcalf. Accompanied by localized swelling and bruising. Area of medial swelling is firm, but rest of calf, including the rest of this lower leg compartment, is soft.  Able to fully flex and extend at the left knee and ankle.  No pain, tenderness, or swelling to the left knee or ankle.  Lymphadenopathy:     Cervical: No cervical adenopathy.  Skin:    General: Skin is warm and dry.  Neurological:     Mental Status: He is alert.     Comments: Sensation light touch grossly intact in the left lower extremity. Strength 5/5 in the left knee and ankle.  Psychiatric:        Mood and Affect: Mood and affect normal.        Speech: Speech normal.        Behavior: Behavior normal.                  ED Treatments / Results  Labs (all labs ordered are listed, but only abnormal results are displayed) Labs Reviewed  COMPREHENSIVE METABOLIC PANEL - Abnormal; Notable for the following  components:      Result Value   Chloride 95 (*)    Glucose, Bld 355 (*)    BUN 21 (*)    Total Protein 9.9 (*)    All other components within normal limits  CBC WITH DIFFERENTIAL/PLATELET - Abnormal; Notable for the following components:   WBC 14.6 (*)    RBC 6.62 (*)    Hemoglobin 18.5 (*)    HCT 56.7 (*)    Neutro  Abs 10.4 (*)    All other components within normal limits  C-REACTIVE PROTEIN - Abnormal; Notable for the following components:   CRP 18.9 (*)    All other components within normal limits  SEDIMENTATION RATE - Abnormal; Notable for the following components:   Sed Rate 74 (*)    All other components within normal limits  SARS CORONAVIRUS 2 (HOSPITAL ORDER, PERFORMED IN [facility])  CULTURE, BLOOD (ROUTINE X 2)  CULTURE, BLOOD (ROUTINE X 2)  LACTIC ACID, PLASMA  CK    EKG None  Radiology No results found.   Mr Tibia Fibula Left W Wo Contrast  Result Date: 06/02/2019 CLINICAL DATA:  Pt states since June 13, pt had charlie horse in left leg. Pt states that he has bruising. Pt states that has since gone away. Pt states he has a large bump on his left lower leg. Pt went to diabetic clinic, who told him it was an infection EXAM: MRI OF LOWER LEFT EXTREMITY WITHOUT AND WITH CONTRAST TECHNIQUE: Multiplanar, multisequence MR imaging of the left leg was performed both before and after administration of intravenous contrast. CONTRAST:  10 mL of Gadavist, intravenously. COMPARISON:  None. FINDINGS: Bones/Joint/Cartilage No abnormal bone marrow signal. No bone lesion. Joints are normally spaced and aligned. No joint effusion. Ligaments Knee not well assessed.  Visualized ankle ligaments appear intact. Muscles and Tendons There is abnormal increased T2 signal within the lateral aspect of the flexor digitorum longus muscle and the adjacent medial margin of the soleus muscle with abnormal enhancement in the same locations. The adjacent subcutaneous soft tissues show  increased T2 signal with abnormal enhancement noted more focally adjacent to the areas of abnormal signal and enhancement of the flexor digitorum longus muscle and medial psoas muscle. Remaining left leg muscles show normal signal with no other areas of abnormal enhancement. The posterior tibial tendon shows evidence of a longitudinal splint below the level of the medial malleolus, not well assessed on this exam. Remaining tendons are normal in signal and thickness. Soft tissues There is diffuse increased T2 signal throughout the subcutaneous soft tissues and extending along the fascia between the flexor digitorum longus muscle and the soleus muscle. A sliver of fluid tracks along the medial margins of the soleus and adjacent medial gastrocnemius muscles. No MRI evidence of soft tissue air. No defined fluid collection to suggest an abscess. IMPRESSION: 1. There is abnormal signal and enhancement along the medial aspect of the flexor digitorum longus muscle and the adjacent medial soleus muscle, associated with overlying soft tissue edema and enhancement. Findings are consistent with both cellulitis and myositis. There is no defined abscess. Fluid tracking along the medial margins of the soleus and medial gastrocnemius muscles raises the possibility of fascitis, but there is no evidence soft tissue air, which argues against this diagnosis. 2. Possible longitudinal split of the distal posterior tibial tendon, presumed unrelated to the current acute findings. This would be best assessed with ankle MRI. 3. There is more diffuse subcutaneous edema surrounding the left leg, predominating anteromedially. Electronically Signed   By: Amie Portlandavid  Ormond M.D.   On: 06/02/2019 18:25   Vas [place] Lower Extremity Venous (dvt) (only [facility])  Result Date: 06/02/2019  Lower Venous Study Indications: Pain, and Swelling.  Limitations: Body habitus and poor ultrasound/tissue interface. Performing Technologist: Gertie FeyMichelle Simonetti MHA, RDMS,  RVT, RDCS  Examination Guidelines: A complete evaluation includes B-mode imaging, spectral Doppler, color Doppler, and power Doppler as needed of all accessible  portions of each vessel. Bilateral testing is considered an integral part of a complete examination. Limited examinations for reoccurring indications may be performed as noted.  +-----+---------------+---------+-----------+----------+--------------+  RIGHT Compressibility Phasicity Spontaneity Properties Summary         +-----+---------------+---------+-----------+----------+--------------+  CFV                                                    Not visualized  +-----+---------------+---------+-----------+----------+--------------+   +---------+---------------+---------+-----------+----------+--------------+  LEFT      Compressibility Phasicity Spontaneity Properties Summary         +---------+---------------+---------+-----------+----------+--------------+  CFV       Full            Yes       Yes                                    +---------+---------------+---------+-----------+----------+--------------+  SFJ       Full                                                             +---------+---------------+---------+-----------+----------+--------------+  FV Prox   Full                                                             +---------+---------------+---------+-----------+----------+--------------+  FV Mid    Full                                                             +---------+---------------+---------+-----------+----------+--------------+  FV Distal Full                                                             +---------+---------------+---------+-----------+----------+--------------+  PFV       Full                                                             +---------+---------------+---------+-----------+----------+--------------+  POP       Full            Yes       Yes                                     +---------+---------------+---------+-----------+----------+--------------+  PTV  Full                      Yes                                    +---------+---------------+---------+-----------+----------+--------------+  PERO                                                       Not visualized  +---------+---------------+---------+-----------+----------+--------------+  GSV       None                                             Acute           +---------+---------------+---------+-----------+----------+--------------+     Summary: Left: Findings consistent with acute superficial vein thrombosis involving the left great saphenous vein. There is no evidence of deep vein thrombosis in the lower extremity. No cystic structure found in the popliteal fossa.  *See table(s) above for measurements and observations. Electronically signed by Waverly Ferrari MD on 06/02/2019 at 5:00:54 PM.    Final     Procedures Procedures (including critical care time)  Medications Ordered in ED Medications  0.9 %  sodium chloride infusion (has no administration in time range)  acetaminophen (TYLENOL) tablet 650 mg (has no administration in time range)    Or  acetaminophen (TYLENOL) suppository 650 mg (has no administration in time range)  oxyCODONE (Oxy IR/ROXICODONE) immediate release tablet 5 mg (has no administration in time range)  ketorolac (TORADOL) 30 MG/ML injection 30 mg (has no administration in time range)  insulin aspart (novoLOG) injection 0-15 Units (has no administration in time range)  insulin aspart (novoLOG) injection 0-5 Units (has no administration in time range)  insulin NPH Human (NOVOLIN N) injection 10 Units (has no administration in time range)  hydrochlorothiazide (MICROZIDE) capsule 12.5 mg (has no administration in time range)  losartan (COZAAR) tablet 50 mg (has no administration in time range)  sodium chloride flush (NS) 0.9 % injection 3 mL (3 mLs Intravenous Given 06/07/19 1931)    morphine 4 MG/ML injection 4 mg (4 mg Intravenous Given 06/07/19 1930)  ondansetron (ZOFRAN) injection 4 mg (4 mg Intravenous Given 06/07/19 1930)  sodium chloride 0.9 % bolus 1,000 mL (0 mLs Intravenous Stopped 06/07/19 2021)  vancomycin (VANCOCIN) IVPB 1000 mg/200 mL premix (0 mg Intravenous Stopped 06/07/19 2229)  piperacillin-tazobactam (ZOSYN) IVPB 3.375 g (0 g Intravenous Stopped 06/07/19 2100)  HYDROmorphone (DILAUDID) injection 1 mg (1 mg Intravenous Given 06/07/19 2034)     Initial Impression / Assessment and Plan / ED Course  I have reviewed the triage vital signs and the nursing notes.  Pertinent labs & imaging results that were available during my care of the patient were reviewed by me and considered in my medical decision making (see chart for details).  Clinical Course as of Jun 06 2250  Sun Jun 07, 2019  8119 Patient has not taken his lisinopril or metformin in about a year.   BP(!): 171/121 [SJ]  2017 Spoke with Dr. Roda Shutters, orthopedic surgeon. Recommends admission via hospitalist.  Continue patient on broad antibiotics.   No  repeat imaging needed in ED.  Plan to repeat MRI in 24-48 hours if no improvement. He will see the patient in the morning.   [SJ]  2100 Spoke with Dr. Maryfrances Bunnell, hospitalist. Agrees to admit the patient.   [SJ]    Clinical Course User Index [SJ] Adrienne Trombetta C, PA-C       Patient presents with worsening left lower leg pain, swelling, and bruising. Duplex ultrasound performed July 7 showed a superficial vein thrombosis. MRI showed evidence of cellulitis and myositis without evidence of tissue gas.  Fasciitis could not be excluded, however, thought less likely due to lack of gas and duration of patient's symptoms. Patient has localized tenderness and firmness, however, the rest of affected lower leg compartment is soft.  No evidence of neurovascular compromise. Afebrile here, but tachycardic and with leukocytosis.  Suspect failure of outpatient treatment  of cellulitis in a patient with uncontrolled diabetes.  Admitted for IV antibiotics and further management.  Findings and plan of care discussed with Chaney Malling, MD. Dr. Silverio Lay personally evaluated and examined this patient.  Vitals:   06/07/19 2045 06/07/19 2100 06/07/19 2115 06/07/19 2130  BP:  (!) 167/109  (!) 155/109  Pulse: (!) 104 (!) 103 (!) 106 (!) 103  Resp:    18  Temp:      TempSrc:      SpO2: 96% 98% 100% 100%     Final Clinical Impressions(s) / ED Diagnoses   Final diagnoses:  Cellulitis of left lower extremity  Infective myositis of left lower extremity    ED Discharge Orders    None       Concepcion Living 06/07/19 2253    Charlynne Pander, MD 06/08/19 1458

## 2019-06-07 NOTE — ED Notes (Signed)
ED Provider at bedside. 

## 2019-06-07 NOTE — H&P (Signed)
History and Physical  Patient Name: Anthony Hubbard     ZOX:[id number]    DOB: [birth year]    DOA: 06/07/2019 PCP: Azzie RoupAnderson, Shane D, FNP  Patient coming from: Home  Chief Complaint: Leg pain, swelling, warmth, redness      HPI: Anthony Hubbard is a 33 y.o. M with hx IDDM, HTN not currently treated who presents with worsening left leg swelling, redness and warmth, failing Augmentin.  The patient was in his usual state of health until mid June, he developed a spontaneous red painful swelling on the front of his left shin.  This persisted, gradually getting worse until about 1 week ago, when he developed a much larger red painful swelling just medial to it.  He went to urgent care, where he was started on Augmentin.  He then came here to the emergency room 7/11, had an MR of the left lower extremity as well as ultrasound of the left lower extremity that showed a superficial venous thrombosis by (US) and mild cellulitis and myositis of the left calf without abscess.  He was benign in appearance and was discharged to continue with Augmentin and follow up with orthopedics, which he was not able to do due to having no insurance.  Now in the last three days, despite taking Augmentin BID as prescribed for last 6 days, he has worsening severe left calf pain, worse with plantarflexion, associated with warmth and swelling.  He has had no firmness of the calf as a whole, and no numbness or loss of function of toes.  Intermittent fever, no respiratory symptoms.  In the ER, he was tachycardic to 120s, BP 180s systolic, WBC 14K.  Lactate normal.  CK normal. He was given IVF, vancomycin and Zosyn.  His case was discussed with orthopedics, who will evaluate patient in AM.        ROS: Review of Systems  Constitutional: Positive for diaphoresis and fever. Negative for chills and malaise/fatigue.  HENT: Negative for sinus pain and sore throat.   Respiratory: Negative for cough and shortness of breath.    Skin: Positive for rash.  All other systems reviewed and are negative.         Past Medical History:  Diagnosis Date  . Diabetes mellitus without complication (HCC)    type 2   . Hypertension   . Phimosis     Past Surgical History:  Procedure Laterality Date  . CIRCUMCISION    . TONSILLECTOMY      Social History: Patient lives with his girlfriend.  The patient walks unassisted.  Works in [employer].  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  Nonsmoker.  No aalcohol.  No Known Allergies  Family history: family history includes Diabetes in his father and paternal uncle.  Prior to Admission medications   Medication Sig Start Date End Date Taking? Authorizing Provider  aspirin 325 MG EC tablet Take 325 mg by mouth daily as needed for pain.   Yes [provider]  meloxicam (MOBIC) 15 MG tablet Take 15 mg by mouth daily as needed for muscle spasms. 05/31/19  Yes [provider]  oxyCODONE-acetaminophen (PERCOCET/ROXICET) 5-325 MG tablet Take 1-2 tablets by mouth every 6 (six) hours as needed for severe pain. Patient taking differently: Take 1-2 tablets by mouth at bedtime as needed for moderate pain.  06/02/19  Yes Terrilee FilesButler, Michael C, MD  metFORMIN (GLUCOPHAGE) 500 MG tablet Take 1 tablet (500 mg total) by mouth 2 (two) times daily. Take 1 tablet  in the morning and 2 tablets in the evening Patient not taking: Reported on 06/07/2019 04/03/17   Lacretia Leigh, MD       Physical Exam: BP (!) 155/109   Pulse (!) 103   Temp 98.7 F (37.1 C) (Oral)   Resp 18   SpO2 100%  General appearance: Well-developed, adult male, alert and in no acute distress.   Eyes: Anicteric, conjunctiva inflamed, lids and lashes normal. PERRL.    ENT: No nasal deformity, discharge, epistaxis.  Hearing normal. OP moist without lesions.   Lips and teeth normal. Skin: Warm and dry.  No jaundice.  No suspicious rashes or lesions.  The left calf has a very painful indurated  area on the medial aspect. The compartments are soft and the distal neurovascularly intact. Cardiac: Tachy, regular, nl S1-S2, no murmurs appreciated.  Capillary refill is brisk.  JVP not visible due to habitus.  No LE edema other than near the infection.  Radial and DP pulses 2+ and symmetric. Respiratory: Normal respiratory rate and rhythm.  CTAB without rales or wheezes. Abdomen: Abdomen soft.  No TTP. No ascites, distension, hepatosplenomegaly.   MSK: No deformities or effusions of the large joints of the upper or lower extremities bilaterally except the left calf infection, pictured below.  No cyanosis or clubbing. Neuro: Cranial nerves normal.  Sensation intact to light touch. Speech is fluent.  Muscle strength normal.    Psych: Sensorium intact and responding to questions, attention normal.  Behavior appropriate.  Affect normal.  Judgment and insight appear normal.     Labs on Admission:  I have personally reviewed following labs and imaging studies: CBC: Recent Labs  Lab 06/02/19 1521 06/07/19 1825  WBC 11.6* 14.6*  NEUTROABS 7.9* 10.4*  HGB 17.4* 18.5*  HCT 53.2* 56.7*  MCV 86.4 85.6  PLT 230 161   Basic Metabolic Panel: Recent Labs  Lab 06/02/19 1521 06/07/19 1825  NA 135 135  K 4.2 4.6  CL 99 95*  CO2 23 25  GLUCOSE 352* 355*  BUN 13 21*  CREATININE 0.91 1.16  CALCIUM 9.5 10.2   GFR: CrCl cannot be calculated (Unknown ideal weight.).  Liver Function Tests: Recent Labs  Lab 06/02/19 1521 06/07/19 1825  AST 14* 31  ALT 27 44  ALKPHOS 89 121  BILITOT 0.7 0.7  PROT 8.4* 9.9*  ALBUMIN 4.3 4.6   Cardiac Enzymes: Recent Labs  Lab 06/07/19 2019  CKTOTAL 74   Sepsis Labs: Lactic acid 1.5         Radiological Exams on Admission: Personally reviewed MRI report from 7/7 and [place] report from 7/7, summarized above        Assessment/Plan   Sepsis from left leg cellulitis No clinical suspicion for compartment syndrome due to benign appearing  calf, neurovascularly intact, slow clinical course.  On my exam, there does seem to be now a fluid collection.  -Continue IV fluids -Stop Zosyn -Continue vancomycin -Start ceftriaxone and Flagyl -Follow blood cultures -Consult to Orthopedics, appreciate cares -Defer further imaging to Orthopedics   Diabetes with hyperglycemia Hyperglycemic on admission, AG normal.  Not taking metformin, Ozempic or Tresiba since being laid off because he can't afford them.   Will start NPH in anticipation of d/c with affordable insulin -Consult diabetes ed -Start NPH 10 BID -Start SSI correction insulin -IV fluids -Check A1c -Hold home metformin -Hold home Ozempic and Degludec    Hypertension Hypertensive urgency BP 181/130 at admission.  Improving in the ER just with pain  control. No evidence of end organ damage. -Restart home HCTZ and losartan -SW consult for meds access at d/c given no insurance  Superficial venous thrombosis -Analgesics as needed -May have Kpad or other hot compress PRN  Elevated hemoglobin Likely from dehydration from infection -Trend Hgb       DVT prophylaxis: Lovenox  Code Status: FULL  Family Communication: None  Disposition Plan: Anticipate IV antibiotics, IV fluids, correction of hyperglycemia. Consults called: Orthopedics, Dr. Roda ShuttersXu to evaluate on Monday Admission status: INPATIENT   At the time of admission, it appears that the appropriate admission status for this patient is INPATIENT. This is judged to be reasonable and necessary in order to provide the required intensity of service to ensure the patient's safety given: -presenting symptoms of pain, fever, swelling, redness, warmth -physical exam findings of leg swelling, pain, and tachycardia -initial radiographic and laboratory data leukocytosis, hyperglycemia -in the context of their chronic comorbidities insulin depednent diabetes, hypertension    Together, these circumstances are felt to  place him at high risk for further clinical deterioration threatening life, limb, or organ requiring a high intensity of service due to this acute illness that poses a threat to life, limb or bodily function and which has already failed appropriate outpatient therapy (worsening despite 5 days Augmentin).  I certify that at the point of admission it is my clinical judgment that the patient will require inpatient hospital care spanning beyond 2 midnights from the point of admission and that early discharge would result in unnecessary risk of decompensation and readmission or threat to life, limb or bodily function.   Medical decision making: Patient seen at 9:37 PM on 06/07/2019.  The patient was discussed with Harolyn RutherfordShawn Joy, PA-C.  What exists of the patient's chart was reviewed in depth and summarized above.  Clinical condition: hemodynamically stable, mentation normal.        Earl Liteshristopher P Acie Custis [facility] Pager: please page via [email or web address]

## 2019-06-07 NOTE — Consult Note (Signed)
ORTHOPAEDIC CONSULTATION  REQUESTING PHYSICIAN: Alberteen Samanford, Christopher P, *  Chief Complaint: LLE cellulitis, pain, myositis  HPI: Anthony Hubbard is a 33 y.o. male who [facility] for worsening LLE pain, swelling, redness.  He came to the ED a few days ago for this and has been on augmentin since.  Since then, his pain and swelling have worsened.  Has had subjective fevers although afebrile in the ED tonight.  MRI obtained during last ED visit showed cellulitis and myositis.  He is an uncontrolled diabetic and has not taken medication because he thought they would be too expensive.  Past Medical History:  Diagnosis Date  . Diabetes mellitus without complication (HCC)    type 2   . Hypertension   . Phimosis    Past Surgical History:  Procedure Laterality Date  . CIRCUMCISION    . TONSILLECTOMY     Social History   Socioeconomic History  . Marital status: Married    Spouse name: Not on file  . Number of children: Not on file  . Years of education: Not on file  . Highest education level: Not on file  Occupational History  . Not on file  Social Needs  . Financial resource strain: Not on file  . Food insecurity    Worry: Not on file    Inability: Not on file  . Transportation needs    Medical: Not on file    Non-medical: Not on file  Tobacco Use  . Smoking status: Never Smoker  . Smokeless tobacco: Never Used  Substance and Sexual Activity  . Alcohol use: No  . Drug use: Yes    Types: Marijuana  . Sexual activity: Not on file  Lifestyle  . Physical activity    Days per week: Not on file    Minutes per session: Not on file  . Stress: Not on file  Relationships  . Social Musicianconnections    Talks on phone: Not on file    Gets together: Not on file    Attends religious service: Not on file    Active member of club or organization: Not on file    Attends meetings of clubs or organizations: Not on file    Relationship status: Not on file  Other  Topics Concern  . Not on file  Social History Narrative  . Not on file   Family History  Problem Relation Age of Onset  . Diabetes Father   . Diabetes Paternal Uncle    - negative except otherwise stated in the family history section No Known Allergies Prior to Admission medications   Medication Sig Start Date End Date Taking? Authorizing Provider  aspirin 325 MG EC tablet Take 325 mg by mouth daily as needed for pain.   Yes [provider]  meloxicam (MOBIC) 15 MG tablet Take 15 mg by mouth daily as needed for muscle spasms. 05/31/19  Yes [provider]  oxyCODONE-acetaminophen (PERCOCET/ROXICET) 5-325 MG tablet Take 1-2 tablets by mouth every 6 (six) hours as needed for severe pain. Patient taking differently: Take 1-2 tablets by mouth at bedtime as needed for moderate pain.  06/02/19  Yes Terrilee FilesButler, Michael C, MD  metFORMIN (GLUCOPHAGE) 500 MG tablet Take 1 tablet (500 mg total) by mouth 2 (two) times daily. Take 1 tablet in the morning and 2 tablets in the evening Patient not taking: Reported on 06/07/2019 04/03/17   Lorre NickAllen, Anthony, MD   No results found. - pertinent xrays, CT,  MRI studies were reviewed and independently interpreted  Positive ROS: All other systems have been reviewed and were otherwise negative with the exception of those mentioned in the HPI and as above.  Physical Exam: General: Alert, no acute distress Cardiovascular: No pedal edema Respiratory: No cyanosis, no use of accessory musculature GI: No organomegaly, abdomen is soft and non-tender Skin: No lesions in the area of chief complaint Neurologic: Sensation intact distally Psychiatric: Patient is competent for consent with normal mood and affect Lymphatic: No axillary or cervical lymphadenopathy  MUSCULOSKELETAL:  - focal swelling and redness on the medial aspect of the lower leg, very tender to palpation - compartments soft - NVI distally - no obvious fluctance  Assessment: LLE myositis,  fasciitis, cellulitis  Plan: - recommend broad spectrum IV abx and monitor clinical exam closely  - repeat MRI in a couple of days if no improvement on IV abx - will follow along  Thank you for the consult and the opportunity to see Anthony Hubbard. Eduard Roux, MD 9:47 PM

## 2019-06-07 NOTE — ED Triage Notes (Signed)
Patient here from home with complaints of left leg pain and swelling increased in the past 3 days. Diagnosis of cellulitis this month, redness and swelling worse. Diabetic

## 2019-06-07 NOTE — Progress Notes (Signed)
Pharmacy Antibiotic Note  Anthony Hubbard is a 33 y.o. male admitted on 06/07/2019 with cellulitis.  Pharmacy has been consulted for vancomycin dosing.  Plan: Vancomycin 1 Gm IV q12h for est AUC = 477 Use scr = 1.16, Vd= 0.5  Goal AUC = 400-550 F/u scr/cultures/levels Rocephin 2 Gm IV q24h (MD) Flagyl 500 mg IV q8h (MD)     Temp (24hrs), Avg:98.7 F (37.1 C), Min:98.7 F (37.1 C), Max:98.7 F (37.1 C)  Recent Labs  Lab 06/02/19 1521 06/07/19 1825  WBC 11.6* 14.6*  CREATININE 0.91 1.16  LATICACIDVEN  --  1.5    CrCl cannot be calculated (Unknown ideal weight.).    No Known Allergies  Antimicrobials this admission: 7/12 zosyn >> x1 ED 7/13 roc/flagyl >> 7/12 vancomycin >>   Dose adjustments this admission:   Microbiology results:  BCx:   UCx:    Sputum:    MRSA PCR:   Thank you for allowing pharmacy to be a part of this patient's care.  Dorrene German 06/07/2019 9:41 PM

## 2019-06-08 LAB — GLUCOSE, CAPILLARY
Glucose-Capillary: 324 mg/dL — ABNORMAL HIGH (ref 70–99)
Glucose-Capillary: 231 mg/dL — ABNORMAL HIGH (ref 70–99)
Glucose-Capillary: 218 mg/dL — ABNORMAL HIGH (ref 70–99)
Glucose-Capillary: 200 mg/dL — ABNORMAL HIGH (ref 70–99)

## 2019-06-08 LAB — HEMOGLOBIN A1C
Hgb A1c MFr Bld: 11.9 % — ABNORMAL HIGH (ref 4.8–5.6)
Mean Plasma Glucose: 294.83 mg/dL

## 2019-06-08 LAB — CBC
HCT: 48.8 % (ref 39.0–52.0)
Hemoglobin: 15.6 g/dL (ref 13.0–17.0)
MCH: 28 pg (ref 26.0–34.0)
MCHC: 32 g/dL (ref 30.0–36.0)
MCV: 87.6 fL (ref 80.0–100.0)
Platelets: 239 10*3/uL (ref 150–400)
RBC: 5.57 MIL/uL (ref 4.22–5.81)
RDW: 12 % (ref 11.5–15.5)
WBC: 13.8 10*3/uL — ABNORMAL HIGH (ref 4.0–10.5)
nRBC: 0 % (ref 0.0–0.2)

## 2019-06-08 LAB — BASIC METABOLIC PANEL
Anion gap: 10 (ref 5–15)
BUN: 20 mg/dL (ref 6–20)
CO2: 23 mmol/L (ref 22–32)
Calcium: 9.1 mg/dL (ref 8.9–10.3)
Chloride: 101 mmol/L (ref 98–111)
Creatinine, Ser: 1.04 mg/dL (ref 0.61–1.24)
GFR calc Af Amer: >60 mL/min (ref >60)
GFR calc non Af Amer: >60 mL/min (ref >60)
Glucose, Bld: 315 mg/dL — ABNORMAL HIGH (ref 70–99)
Potassium: 5.4 mmol/L — ABNORMAL HIGH (ref 3.5–5.1)
Sodium: 134 mmol/L — ABNORMAL LOW (ref 135–145)

## 2019-06-08 LAB — HIV ANTIBODY (ROUTINE TESTING W REFLEX): HIV Screen 4th Generation wRfx: NONREACTIVE

## 2019-06-08 MED ORDER — INSULIN NPH (HUMAN) (ISOPHANE) 100 UNIT/ML ~~LOC~~ SUSP
15.0000 [IU] | Freq: Two times a day (BID) | SUBCUTANEOUS | Status: DC
  Administered 2019-06-08 – 2019-06-09 (×2): 15 [IU] via SUBCUTANEOUS
  Filled 2019-06-08: qty 10

## 2019-06-08 MED ORDER — VANCOMYCIN HCL IN DEXTROSE 1-5 GM/200ML-% IV SOLN
1000.0000 mg | Freq: Two times a day (BID) | INTRAVENOUS | Status: DC
  Administered 2019-06-08 – 2019-06-09 (×3): 1000 mg via INTRAVENOUS
  Filled 2019-06-08 (×4): qty 200

## 2019-06-08 NOTE — Progress Notes (Addendum)
Marland Kitchen.  PROGRESS NOTE    Anthony Hubbard  ZOX:[id number] DOB: 24-Dec-1985 DOA: 06/07/2019 PCP: Azzie RoupAnderson, Shane D, FNP   Brief Narrative:   Anthony Hubbard is a 33 y.o. M with hx IDDM, HTN not currently treated who presents with worsening left leg swelling, redness and warmth, failing Augmentin.  The patient was in his usual state of health until mid June, he developed a spontaneous red painful swelling on the front of his left shin.  This persisted, gradually getting worse until about 1 week ago, when he developed a much larger red painful swelling just medial to it.  He went to urgent care, where he was started on Augmentin.  He then came here to the emergency room 7/11, had an MR of the left lower extremity as well as ultrasound of the left lower extremity that showed a superficial venous thrombosis by (US) and mild cellulitis and myositis of the left calf without abscess.  He was benign in appearance and was discharged to continue with Augmentin and follow up with orthopedics, which he was not able to do due to having no insurance.  Now in the last three days, despite taking Augmentin BID as prescribed for last 6 days, he has worsening severe left calf pain, worse with plantarflexion, associated with warmth and swelling.  He has had no firmness of the calf as a whole, and no numbness or loss of function of toes.  Intermittent fever, no respiratory symptoms.  In the ER, he was tachycardic to 120s, BP 180s systolic, WBC 14K.  Lactate normal.  CK normal. He was given IVF, vancomycin and Zosyn.  His case was discussed with orthopedics, who will evaluate patient in AM.   Assessment & Plan:   Principal Problem:   Sepsis (HCC) Active Problems:   Cellulitis and abscess of left leg   Type 2 diabetes mellitus with hyperglycemia, with long-term current use of insulin (HCC)   Essential hypertension   Hypertensive urgency   Sepsis from left leg cellulitis     - No clinical suspicion for  compartment syndrome due to benign appearing calf, neurovascularly intact, slow clinical course.     - Continue IV fluids     - Continue vancomycin, ceftriaxone and Flagyl     - follow Bld Cx     - orthopedics onboard, appreciate assistance  Diabetes with hyperglycemia     - Hyperglycemic on admission, AG normal.       - Not taking metformin, Ozempic or Tresiba since being laid off because he can't afford them.     - now on NPH 10 units BID in anticipation of d/c with affordable insulin (likely [facility] insulin at [facility] best option; DM coordinator to see); increase NPH to 15 units BID     - Start SSI correction insulin     - A1c 11.9  Hypertension/Hypertensive urgency     - BP 181/130 at admission.     - Improving in the ER just with pain control.     - No evidence of end organ damage.     - home HCTZ and losartan     - SW consult for meds access at d/c given no insurance (if unable to get these meds, [facility] has free lisinopril/norvasc)  Superficial venous thrombosis     - Analgesics as needed     - May have Kpad or other hot compress PRN   DVT prophylaxis: Lovenox Code Status: FULL   Disposition Plan: TBD   Consultants:  Orthopedics   Antimicrobials:  Clement Husbands. Vanc, rocephin, flagyl    Subjective: "It's been tough without insurance."  Objective: Vitals:   06/07/19 2130 06/07/19 2332 06/07/19 2333 06/08/19 0528  BP: (!) 155/109  (!) 158/112 138/86  Pulse: (!) 103  (!) 109 78  Resp: 18  18 16   Temp:   98.9 F (37.2 C) 97.8 F (36.6 C)  TempSrc:      SpO2: 100%  98% 99%  Weight:  105.9 kg    Height:  5\' 8"  (1.727 m)      Intake/Output Summary (Last 24 hours) at 06/08/2019 0916 Last data filed at 06/08/2019 [id number] Gross per 24 hour  Intake 1162.24 ml  Output 1000 ml  Net 162.24 ml   Filed Weights   06/07/19 2332  Weight: 105.9 kg    Examination:  General exam: 33 y.o. male Appears calm and comfortable  Respiratory system: Clear to auscultation.  Respiratory effort normal. Cardiovascular system: S1 & S2 heard, RRR. No JVD, murmurs, rubs, gallops or clicks. No pedal edema. Gastrointestinal system: Abdomen is nondistended, soft and nontender. No organomegaly or masses felt. Normal bowel sounds heard. Central nervous system: Alert and oriented. No focal neurological deficits. Extremities: Symmetric 5 x 5 power. LLE w/ swelling/erythema mid-shin anterioly and medially, TTP Skin: No rashes, lesions or ulcers Psychiatry: Judgement and insight appear normal. Mood & affect appropriate.     Data Reviewed: I have personally reviewed following labs and imaging studies.  CBC: Recent Labs  Lab 06/02/19 1521 06/07/19 1825 06/08/19 0319  WBC 11.6* 14.6* 13.8*  NEUTROABS 7.9* 10.4*  --   HGB 17.4* 18.5* 15.6  HCT 53.2* 56.7* 48.8  MCV 86.4 85.6 87.6  PLT 230 369 239   Basic Metabolic Panel: Recent Labs  Lab 06/02/19 1521 06/07/19 1825 06/08/19 0319  NA 135 135 134*  K 4.2 4.6 5.4*  CL 99 95* 101  CO2 23 25 23   GLUCOSE 352* 355* 315*  BUN 13 21* 20  CREATININE 0.91 1.16 1.04  CALCIUM 9.5 10.2 9.1   GFR: Estimated Creatinine Clearance: 122.5 mL/min (by C-G formula based on SCr of 1.04 mg/dL). Liver Function Tests: Recent Labs  Lab 06/02/19 1521 06/07/19 1825  AST 14* 31  ALT 27 44  ALKPHOS 89 121  BILITOT 0.7 0.7  PROT 8.4* 9.9*  ALBUMIN 4.3 4.6   No results for input(s): LIPASE, AMYLASE in the last 168 hours. No results for input(s): AMMONIA in the last 168 hours. Coagulation Profile: No results for input(s): INR, PROTIME in the last 168 hours. Cardiac Enzymes: Recent Labs  Lab 06/07/19 2019  CKTOTAL 74   BNP (last 3 results) No results for input(s): PROBNP in the last 8760 hours. HbA1C: Recent Labs    06/08/19 0319  HGBA1C 11.9*   CBG: Recent Labs  Lab 06/07/19 2349 06/08/19 0731  GLUCAP 287* 324*   Lipid Profile: No results for input(s): CHOL, HDL, LDLCALC, TRIG, CHOLHDL, LDLDIRECT in the last  72 hours. Thyroid Function Tests: No results for input(s): TSH, T4TOTAL, FREET4, T3FREE, THYROIDAB in the last 72 hours. Anemia Panel: No results for input(s): VITAMINB12, FOLATE, FERRITIN, TIBC, IRON, RETICCTPCT in the last 72 hours. Sepsis Labs: Recent Labs  Lab 06/07/19 1825  LATICACIDVEN 1.5    Recent Results (from the past 240 hour(s))  Culture, blood (routine x 2)     Status: None (Preliminary result)   Collection Time: 06/07/19  7:26 PM   Specimen: BLOOD  Result Value Ref Range Status  Specimen Description   Final    BLOOD LEFT ANTECUBITAL Performed at [facility], [place] [address] [place], [place] [address]    Special Requests   Final    BOTTLES DRAWN AEROBIC AND ANAEROBIC Blood Culture adequate volume Performed at [place] [address] [place], [place] [address]    Culture PENDING  Incomplete   Report Status PENDING  Incomplete  SARS Coronavirus 2 (CEPHEID - Performed in [facility] hospital lab), Hosp Order     Status: None   Collection Time: 06/07/19  9:00 PM   Specimen: Nasopharyngeal Swab  Result Value Ref Range Status   SARS Coronavirus 2 NEGATIVE NEGATIVE Final    Comment: (NOTE) If result is NEGATIVE SARS-CoV-2 target nucleic acids are NOT DETECTED. The SARS-CoV-2 RNA is generally detectable in upper and lower  respiratory specimens during the acute phase of infection. The lowest  concentration of SARS-CoV-2 viral copies this assay can detect is 250  copies / mL. A negative result does not preclude SARS-CoV-2 infection  and should not be used as the sole basis for treatment or other  patient management decisions.  A negative result may occur with  improper specimen collection / handling, submission of specimen other  than nasopharyngeal swab, presence of viral mutation(s) within the  areas targeted by this assay, and inadequate number of viral copies  (<250 copies / mL). A negative result must be combined with clinical   observations, patient history, and epidemiological information. If result is POSITIVE SARS-CoV-2 target nucleic acids are DETECTED. The SARS-CoV-2 RNA is generally detectable in upper and lower  respiratory specimens dur ing the acute phase of infection.  Positive  results are indicative of active infection with SARS-CoV-2.  Clinical  correlation with patient history and other diagnostic information is  necessary to determine patient infection status.  Positive results do  not rule out bacterial infection or co-infection with other viruses. If result is PRESUMPTIVE POSTIVE SARS-CoV-2 nucleic acids MAY BE PRESENT.   A presumptive positive result was obtained on the submitted specimen  and confirmed on repeat testing.  While 2019 novel coronavirus  (SARS-CoV-2) nucleic acids may be present in the submitted sample  additional confirmatory testing may be necessary for epidemiological  and / or clinical management purposes  to differentiate between  SARS-CoV-2 and other Sarbecovirus currently known to infect humans.  If clinically indicated additional testing with an alternate test  methodology [phone]) is advised. The SARS-CoV-2 RNA is generally  detectable in upper and lower respiratory sp ecimens during the acute  phase of infection. The expected result is Negative. Fact Sheet for Patients:  StrictlyIdeas.no Fact Sheet for Healthcare Providers: BankingDealers.co.za This test is not yet approved or cleared by the [place] FDA and has been authorized for detection and/or diagnosis of SARS-CoV-2 by FDA under an Emergency Use Authorization (EUA).  This EUA will remain in effect (meaning this test can be used) for the duration of the COVID-19 declaration under Section 564(b)(1) of the Act, 21 U.S.C. section 360bbb-3(b)(1), unless the authorization is terminated or revoked sooner. Performed at [facility], [place]  [address] [place], [place] [address]          Radiology Studies: No results found.   Scheduled Meds: . enoxaparin (LOVENOX) injection  40 mg Subcutaneous Daily  . hydrochlorothiazide  12.5 mg Oral Daily  . insulin aspart  0-15 Units Subcutaneous TID WC  . insulin aspart  0-5 Units Subcutaneous QHS  . insulin  NPH Human  10 Units Subcutaneous BID AC & HS  . losartan  50 mg Oral Daily   Continuous Infusions: . cefTRIAXone (ROCEPHIN)  IV Stopped (06/08/19 0207)  . metronidazole Stopped (06/08/19 0427)  . vancomycin Stopped (06/08/19 0530)     LOS: 1 day    Time spent: 25 minutes spent in the coordination of care today.    Teddy Spikeyrone A Pacey Altizer, DO [facility] Pager [phone]  If 7PM-7AM, please contact night-coverage [email or web address] Password [email or web address] 06/08/2019, 9:16 AM

## 2019-06-09 ENCOUNTER — Inpatient Hospital Stay (HOSPITAL_COMMUNITY): Payer: Self-pay

## 2019-06-09 LAB — RENAL FUNCTION PANEL
Albumin: 3.3 g/dL — ABNORMAL LOW (ref 3.5–5.0)
Anion gap: 14 (ref 5–15)
BUN: 15 mg/dL (ref 6–20)
CO2: 22 mmol/L (ref 22–32)
Calcium: 9.3 mg/dL (ref 8.9–10.3)
Chloride: 99 mmol/L (ref 98–111)
Creatinine, Ser: 0.84 mg/dL (ref 0.61–1.24)
GFR calc Af Amer: >60 mL/min (ref >60)
GFR calc non Af Amer: >60 mL/min (ref >60)
Glucose, Bld: 273 mg/dL — ABNORMAL HIGH (ref 70–99)
Phosphorus: 4.1 mg/dL (ref 2.5–4.6)
Potassium: 3.7 mmol/L (ref 3.5–5.1)
Sodium: 135 mmol/L (ref 135–145)

## 2019-06-09 LAB — GLUCOSE, CAPILLARY
Glucose-Capillary: 262 mg/dL — ABNORMAL HIGH (ref 70–99)
Glucose-Capillary: 182 mg/dL — ABNORMAL HIGH (ref 70–99)
Glucose-Capillary: 291 mg/dL — ABNORMAL HIGH (ref 70–99)
Glucose-Capillary: 189 mg/dL — ABNORMAL HIGH (ref 70–99)

## 2019-06-09 LAB — CBC WITH DIFFERENTIAL/PLATELET
Abs Immature Granulocytes: 0.06 10*3/uL (ref 0.00–0.07)
Basophils Absolute: 0.1 10*3/uL (ref 0.0–0.1)
Basophils Relative: 1 %
Eosinophils Absolute: 0.1 10*3/uL (ref 0.0–0.5)
Eosinophils Relative: 1 %
HCT: 46.9 % (ref 39.0–52.0)
Hemoglobin: 15.5 g/dL (ref 13.0–17.0)
Immature Granulocytes: 1 %
Lymphocytes Relative: 29 %
Lymphs Abs: 3.3 10*3/uL (ref 0.7–4.0)
MCH: 28.4 pg (ref 26.0–34.0)
MCHC: 33 g/dL (ref 30.0–36.0)
MCV: 86.1 fL (ref 80.0–100.0)
Monocytes Absolute: 0.8 10*3/uL (ref 0.1–1.0)
Monocytes Relative: 7 %
Neutro Abs: 6.8 10*3/uL (ref 1.7–7.7)
Neutrophils Relative %: 61 %
Platelets: 288 10*3/uL (ref 150–400)
RBC: 5.45 MIL/uL (ref 4.22–5.81)
RDW: 12.1 % (ref 11.5–15.5)
WBC: 11.2 10*3/uL — ABNORMAL HIGH (ref 4.0–10.5)
nRBC: 0 % (ref 0.0–0.2)

## 2019-06-09 LAB — MAGNESIUM: Magnesium: 1.6 mg/dL — ABNORMAL LOW (ref 1.7–2.4)

## 2019-06-09 MED ORDER — VANCOMYCIN HCL IN DEXTROSE 1-5 GM/200ML-% IV SOLN
1000.0000 mg | Freq: Three times a day (TID) | INTRAVENOUS | Status: DC
  Administered 2019-06-09 – 2019-06-11 (×6): 1000 mg via INTRAVENOUS
  Filled 2019-06-09 (×9): qty 200

## 2019-06-09 MED ORDER — LOSARTAN POTASSIUM 50 MG PO TABS
100.0000 mg | ORAL_TABLET | Freq: Every day | ORAL | Status: DC
  Administered 2019-06-10 – 2019-06-15 (×6): 100 mg via ORAL
  Filled 2019-06-09 (×6): qty 2

## 2019-06-09 MED ORDER — GADOBUTROL 1 MMOL/ML IV SOLN
10.0000 mL | Freq: Once | INTRAVENOUS | Status: AC | PRN
  Administered 2019-06-09: 10 mL via INTRAVENOUS

## 2019-06-09 MED ORDER — MORPHINE SULFATE (PF) 2 MG/ML IV SOLN
1.0000 mg | Freq: Once | INTRAVENOUS | Status: AC
  Administered 2019-06-09: 14:00:00 1 mg via INTRAVENOUS
  Filled 2019-06-09: qty 1

## 2019-06-09 MED ORDER — INSULIN NPH (HUMAN) (ISOPHANE) 100 UNIT/ML ~~LOC~~ SUSP
20.0000 [IU] | Freq: Two times a day (BID) | SUBCUTANEOUS | Status: DC
  Administered 2019-06-09 – 2019-06-11 (×3): 20 [IU] via SUBCUTANEOUS
  Filled 2019-06-09 (×2): qty 10

## 2019-06-09 MED ORDER — LIVING WELL WITH DIABETES BOOK
Freq: Once | Status: AC
  Administered 2019-06-09: 14:00:00
  Filled 2019-06-09: qty 1

## 2019-06-09 MED ORDER — SODIUM CHLORIDE 0.9 % IV SOLN
INTRAVENOUS | Status: DC | PRN
  Administered 2019-06-09: 13:00:00 1000 mL via INTRAVENOUS

## 2019-06-09 MED ORDER — MAGNESIUM OXIDE 400 (241.3 MG) MG PO TABS
400.0000 mg | ORAL_TABLET | Freq: Two times a day (BID) | ORAL | Status: DC
  Administered 2019-06-09 – 2019-06-15 (×13): 400 mg via ORAL
  Filled 2019-06-09 (×13): qty 1

## 2019-06-09 NOTE — Progress Notes (Addendum)
Swelling appears no better, possibly slightly worse.  Still very tender over the swollen area.  At this time, will repeat MRI to evaluate for interval abscess formation.  Will make NPO after midnight in case he needs to go for I&D.  Lovenox held also.  [name], MD [facility] [phone] 5:26 PM

## 2019-06-09 NOTE — Progress Notes (Signed)
Inpatient Diabetes Program Recommendations  AACE/ADA: New Consensus Statement on Inpatient Glycemic Control (2015)  Target Ranges:  Prepandial:   less than 140 mg/dL      Peak postprandial:   less than 180 mg/dL (1-2 hours)      Critically ill patients:  140 - 180 mg/dL   Lab Results  Component Value Date   GLUCAP 182 (H) 06/09/2019   HGBA1C 11.9 (H) 06/08/2019    Review of Glycemic Control  Spoke with pt at length regarding his diabetes and HgbA1C of 11.9%. Pt states he stopped taking his insulin and OHAs when he lost his job. Has meter but does not work. Was trying to eat healthy and exercise, but blood sugars continued to be elevated. Interested in OP Diabetes Education. States he really wants to control his blood sugars to heal his leg cellulitis. Pt states he prefers insulin pen over vial/syringe. Does not want to return to his PCP. Very interested in [facility] for PCP to manage his diabetes. Have ordered care manager consult for same. Also discussed ReliOn insulin at [facility]. NPH and R are $44 for pens. Also 70/30 is $44 for pens. Will follow-up on 7/15 after care management consult.  Discussed above with RN.   Thank you. Lorenda Peck, RD, LDN, CDE Inpatient Diabetes Coordinator [phone]

## 2019-06-09 NOTE — Plan of Care (Signed)
Discussed plan of care for [facility] tomorrow

## 2019-06-09 NOTE — Progress Notes (Signed)
NP called by Dr. Erlinda Hong (ortho). Dr. Erlinda Hong stated pt needed surgery tomorrow to "wash out" the abscess and wanted pt transferred to [facility]. Orders placed. Dr. Erlinda Hong to place preop orders. Medical necessity form completed.  KJKG, NP [facility]

## 2019-06-09 NOTE — Progress Notes (Addendum)
Cherylann Ratel, DO was paged regarding the pt's continued pain after receiving PRN Tylenol and 5 mg of Oxy post bathroom trip. Cherylann Ratel, DO returned my page and gave verbal orders for 1 mg of IV morphine one time dose.

## 2019-06-09 NOTE — Progress Notes (Signed)
Pharmacy Antibiotic Note  Anthony Hubbard is a 33 y.o. male admitted on 06/07/2019 with LLE cellulitis, myositis. Pharmacy has been consulted for Vancomycin dosing. Patient also placed on Ceftriaxone and Metronidazole per MD.   Plan: Adjust Vancomycin to 1g IV q8h Plan for Vancomycin levels at steady state, as indicated Ceftriaxone 2g IV q24h per MD Metronidazole 500mg  IV q8h per MD Monitor renal function, cultures, clinical course  Height: 5\' 8"  (172.7 cm) Weight: 233 lb 7.5 oz (105.9 kg) IBW/kg (Calculated) : 68.4  Temp (24hrs), Avg:98.8 F (37.1 C), Min:98.7 F (37.1 C), Max:98.9 F (37.2 C)  Recent Labs  Lab 06/02/19 1521 06/07/19 1825 06/08/19 0319 06/09/19 0341  WBC 11.6* 14.6* 13.8* 11.2*  CREATININE 0.91 1.16 1.04 0.84  LATICACIDVEN  --  1.5  --   --     Estimated Creatinine Clearance: 151.7 mL/min (by C-G formula based on SCr of 0.84 mg/dL).    No Known Allergies  Antimicrobials this admission: 7/12 zosyn x 1  7/12 vancomycin >>  7/13 ceftriaxone >> 7/13 metronidazole >>  Microbiology results: 7/12 BCx: pending  7/12 COVID: negative    Thank you for allowing pharmacy to be a part of this patient's care.   Lindell Spar, PharmD, BCPS Pager: [phone] 06/09/2019 1:08 PM

## 2019-06-09 NOTE — Progress Notes (Signed)
MRI consistent with abscess formation.  Will need formal I&D in OR.  Appreciate [facility] tonight for surgery tomorrow.  Consent obtained from patient over phone tonight.  Patient updated on plan.

## 2019-06-09 NOTE — Progress Notes (Signed)
Anthony Hubbard Kitchen.  PROGRESS NOTE    Anthony Hubbard  ZOX:[id number] DOB: 01/17/1986 DOA: 06/07/2019 PCP: Maudie FlakesAnderson, Anthony D, FNP   Brief Narrative:   Anthony PheasantCarlos Gomez-Bellois a 33 y.o.Mwith hx IDDM, HTN not currently treatedwho presents with worsening left leg swelling, redness and warmth, failing Augmentin.  The patient was in his usual state of health until mid June, he developed a spontaneous red painful swelling on the front of his left shin. This persisted, gradually getting worse until about 1 week ago, when he developed a much larger red painful swelling just medial to it. He went to urgent care, where he was started on Augmentin. He then came here to the emergency room 7/11, had an MR of the left lower extremity as well as ultrasound of the left lower extremity that showed a superficial venous thrombosis by (US)andmild cellulitis and myositis of the left calf without abscess.  He was benign in appearance and was discharged to continue with Augmentin and follow up with orthopedics, which he was not able to do due to having no insurance.  Now in the last three days, despite taking Augmentin BID as prescribed for last 6 days, he has worsening severe left calf pain, worse with plantarflexion, associated with warmth and swelling. He has had no firmness of the calf as a whole, and no numbness or loss of function of toes. Intermittent fever, no respiratory symptoms.  In the ER, he was tachycardic to 120s, BP 180s systolic, WBC 14K. Lactate normal. CK normal. He was given IVF, vancomycin and Zosyn. His case was discussed with orthopedics, who will evaluate patient in AM.   Assessment & Plan:   Principal Problem:   Sepsis (HCC) Active Problems:   Cellulitis and abscess of left leg   Type 2 diabetes mellitus with hyperglycemia, with long-term current use of insulin (HCC)   Essential hypertension   Hypertensive urgency   Sepsis from left leg cellulitis     - No clinical suspicion for  compartment syndrome due to benign appearing calf, neurovascularly intact, slow clinical course.     - Continue IV fluids     - Continue vancomycin, ceftriaxone and Flagyl     - follow Bld Cx     - orthopedics onboard, appreciate assistance     - cellulitis is improving; consider AM MRI rpt  Diabetes with hyperglycemia     - Hyperglycemic on admission, AG normal.       - Not taking metformin, Ozempic or Tresiba since being laid off because he can't afford them.     - now on NPH 15 units BID in anticipation of d/c with affordable insulin ([facility] best [facility]; DM coordinator to see); increase NPH to 20 units BID     - SSI correction insulin     - A1c 11.9  Hypertension/Hypertensive urgency     - BP 181/130 at admission.     - Improving in the ER just with pain control.     - No evidence of end organ damage.     - home HCTZ and losartan     - SW consult for meds access at d/c given no insurance (if unable to get these meds, [facility] has free lisinopril/norvasc)     - increase losartan to 100mg  daily, watch potassium  Superficial venous thrombosis     - Analgesics as needed     - May have Kpad or other hot compress PRN   DVT prophylaxis: Lovenox Code Status: FULL  Disposition Plan: TBD   Consultants:   Orthopedics   Antimicrobials:  Deniece Ree, rocephin, flagyl    Subjective: "It's a little better."  Objective: Vitals:   06/09/19 0418 06/09/19 0803 06/09/19 0925 06/09/19 0928  BP: (!) 148/95 (!) 139/101 (!) 156/104 (!) 147/91  Pulse: 91 72 94 88  Resp: 18     Temp: 98.7 F (37.1 C)     TempSrc: Oral     SpO2: 96%     Weight:      Height:        Intake/Output Summary (Last 24 hours) at 06/09/2019 1107 Last data filed at 06/09/2019 0800 Gross per 24 hour  Intake 1780.03 ml  Output 1250 ml  Net 530.03 ml   Filed Weights   06/07/19 2332  Weight: 105.9 kg    Examination:  General exam: 33 y.o. male Appears calm and comfortable   Respiratory system: Clear to auscultation. Respiratory effort normal. Cardiovascular system: S1 & S2 heard, RRR. No JVD, murmurs, rubs, gallops or clicks. No pedal edema. Gastrointestinal system: Abdomen is nondistended, soft and nontender. No organomegaly or masses felt. Normal bowel sounds heard. Central nervous system: Alert and oriented. No focal neurological deficits. Extremities: Symmetric 5 x 5 power. LLE w/ swelling/erythema mid-shin anterioly and medially, TTP Skin: No rashes, lesions or ulcers Psychiatry: Judgement and insight appear normal. Mood & affect appropriate.     Data Reviewed: I have personally reviewed following labs and imaging studies.  CBC: Recent Labs  Lab 06/02/19 1521 06/07/19 1825 06/08/19 0319 06/09/19 0341  WBC 11.6* 14.6* 13.8* 11.2*  NEUTROABS 7.9* 10.4*  --  6.8  HGB 17.4* 18.5* 15.6 15.5  HCT 53.2* 56.7* 48.8 46.9  MCV 86.4 85.6 87.6 86.1  PLT 230 369 239 361   Basic Metabolic Panel: Recent Labs  Lab 06/02/19 1521 06/07/19 1825 06/08/19 0319 06/09/19 0341  NA 135 135 134* 135  K 4.2 4.6 5.4* 3.7  CL 99 95* 101 99  CO2 23 25 23 22   GLUCOSE 352* 355* 315* 273*  BUN 13 21* 20 15  CREATININE 0.91 1.16 1.04 0.84  CALCIUM 9.5 10.2 9.1 9.3  MG  --   --   --  1.6*  PHOS  --   --   --  4.1   GFR: Estimated Creatinine Clearance: 151.7 mL/min (by C-G formula based on SCr of 0.84 mg/dL). Liver Function Tests: Recent Labs  Lab 06/02/19 1521 06/07/19 1825 06/09/19 0341  AST 14* 31  --   ALT 27 44  --   ALKPHOS 89 121  --   BILITOT 0.7 0.7  --   PROT 8.4* 9.9*  --   ALBUMIN 4.3 4.6 3.3*   No results for input(s): LIPASE, AMYLASE in the last 168 hours. No results for input(s): AMMONIA in the last 168 hours. Coagulation Profile: No results for input(s): INR, PROTIME in the last 168 hours. Cardiac Enzymes: Recent Labs  Lab 06/07/19 2019  CKTOTAL 74   BNP (last 3 results) No results for input(s): PROBNP in the last 8760 hours.  HbA1C: Recent Labs    06/08/19 0319  HGBA1C 11.9*   CBG: Recent Labs  Lab 06/08/19 0731 06/08/19 1153 06/08/19 1618 06/08/19 2139 06/09/19 0733  GLUCAP 324* 231* 218* 200* 262*   Lipid Profile: No results for input(s): CHOL, HDL, LDLCALC, TRIG, CHOLHDL, LDLDIRECT in the last 72 hours. Thyroid Function Tests: No results for input(s): TSH, T4TOTAL, FREET4, T3FREE, THYROIDAB in the last 72 hours. Anemia Panel: No  results for input(s): VITAMINB12, FOLATE, FERRITIN, TIBC, IRON, RETICCTPCT in the last 72 hours. Sepsis Labs: Recent Labs  Lab 06/07/19 1825  LATICACIDVEN 1.5    Recent Results (from the past 240 hour(s))  Culture, blood (routine x 2)     Status: None (Preliminary result)   Collection Time: 06/07/19  7:26 PM   Specimen: BLOOD  Result Value Ref Range Status   Specimen Description   Final    BLOOD LEFT ANTECUBITAL Performed at [facility], [address] [place], [place] [address]    Special Requests   Final    BOTTLES DRAWN AEROBIC AND ANAEROBIC Blood Culture adequate volume Performed at [facility], [address] [place], [place] [address]    Culture PENDING  Incomplete   Report Status PENDING  Incomplete  SARS Coronavirus 2 (CEPHEID - Performed in [facility] hospital lab), Hosp Order     Status: None   Collection Time: 06/07/19  9:00 PM   Specimen: Nasopharyngeal Swab  Result Value Ref Range Status   SARS Coronavirus 2 NEGATIVE NEGATIVE Final    Comment: (NOTE) If result is NEGATIVE SARS-CoV-2 target nucleic acids are NOT DETECTED. The SARS-CoV-2 RNA is generally detectable in upper and lower  respiratory specimens during the acute phase of infection. The lowest  concentration of SARS-CoV-2 viral copies this assay can detect is 250  copies / mL. A negative result does not preclude SARS-CoV-2 infection  and should not be used as the sole basis for treatment or other  patient management decisions.  A negative result may  occur with  improper specimen collection / handling, submission of specimen other  than nasopharyngeal swab, presence of viral mutation(s) within the  areas targeted by this assay, and inadequate number of viral copies  (<250 copies / mL). A negative result must be combined with clinical  observations, patient history, and epidemiological information. If result is POSITIVE SARS-CoV-2 target nucleic acids are DETECTED. The SARS-CoV-2 RNA is generally detectable in upper and lower  respiratory specimens dur ing the acute phase of infection.  Positive  results are indicative of active infection with SARS-CoV-2.  Clinical  correlation with patient history and other diagnostic information is  necessary to determine patient infection status.  Positive results do  not rule out bacterial infection or co-infection with other viruses. If result is PRESUMPTIVE POSTIVE SARS-CoV-2 nucleic acids MAY BE PRESENT.   A presumptive positive result was obtained on the submitted specimen  and confirmed on repeat testing.  While 2019 novel coronavirus  (SARS-CoV-2) nucleic acids may be present in the submitted sample  additional confirmatory testing may be necessary for epidemiological  and / or clinical management purposes  to differentiate between  SARS-CoV-2 and other Sarbecovirus currently known to infect humans.  If clinically indicated additional testing with an alternate test  methodology [phone] is advised. The SARS-CoV-2 RNA is generally  detectable in upper and lower respiratory sp ecimens during the acute  phase of infection. The expected result is Negative. Fact Sheet for Patients:  BoilerBrush.com.cyhttps://www.fda.gov/media/136312/download Fact Sheet for Healthcare Providers: https://pope.com/https://www.fda.gov/media/136313/download This test is not yet approved or cleared by the [place] FDA and has been authorized for detection and/or diagnosis of SARS-CoV-2 by FDA under an Emergency Use Authorization (EUA).  This  EUA will remain in effect (meaning this test can be used) for the duration of the COVID-19 declaration under Section 564(b)(1) of the Act, 21 U.S.C. section 360bbb-3(b)(1), unless the authorization is terminated or revoked sooner. Performed  at [facility], [address] [place], [place] [address]      Radiology Studies: No results found.   Scheduled Meds: . enoxaparin (LOVENOX) injection  40 mg Subcutaneous Daily  . hydrochlorothiazide  12.5 mg Oral Daily  . insulin aspart  0-15 Units Subcutaneous TID WC  . insulin aspart  0-5 Units Subcutaneous QHS  . insulin NPH Human  20 Units Subcutaneous BID AC & HS  . losartan  50 mg Oral Daily   Continuous Infusions: . cefTRIAXone (ROCEPHIN)  IV 2 g (06/08/19 2105)  . metronidazole 500 mg (06/09/19 0538)  . vancomycin 1,000 mg (06/09/19 0417)     LOS: 2 days    Time spent: 25 minutes spent in the coordination of care today.    Teddy Spikeyrone A Naod Sweetland, DO [facility] Pager [phone]  If 7PM-7AM, please contact night-coverage [email or web address] Password [email or web address] 06/09/2019, 11:07 AM

## 2019-06-09 NOTE — Progress Notes (Signed)
Inpatient Diabetes Program Recommendations  AACE/ADA: New Consensus Statement on Inpatient Glycemic Control (2015)  Target Ranges:  Prepandial:   less than 140 mg/dL      Peak postprandial:   less than 180 mg/dL (1-2 hours)      Critically ill patients:  140 - 180 mg/dL   Lab Results  Component Value Date   GLUCAP 262 (H) 06/09/2019   HGBA1C 11.9 (H) 06/08/2019    Review of Glycemic Control  Diabetes history: DM2 Outpatient Diabetes medications: metformin 500 mg in am and 1000 mg in pm Current orders for Inpatient glycemic control: NPH 20 units bid, Novolog 0-15 units tidwc and 0-5 units QHS  HgbA1C 11.89% - uncontrolled. Not taking metformin Ozempic or Tyler Aas since being laid off. ReliOn insulin NPH and R will be most cost-effective insulin. Care manager consult - could pt go to [facility] for PCP and get insulin there at clinic.  Inpatient Diabetes Program Recommendations:     Care manager consult regarding assistance with meds, PCP May benefit from addition of Novolog 4 units tidwc for meal coverage insulin. NPH increased to 20 units bid.  Will talk with pt and care manager this am. Order care manager consult  Continue to follow.   Thank you. Lorenda Peck, RD, LDN, CDE  Inpatient Diabetes Coordinator [phone]

## 2019-06-10 ENCOUNTER — Inpatient Hospital Stay (HOSPITAL_COMMUNITY): Payer: Self-pay | Admitting: Certified Registered"

## 2019-06-10 ENCOUNTER — Encounter (HOSPITAL_COMMUNITY): Payer: Self-pay | Admitting: Orthopedic Surgery

## 2019-06-10 ENCOUNTER — Encounter (HOSPITAL_COMMUNITY)
Admission: EM | Disposition: A | Discharge status: Home / Self Care | Payer: Self-pay | Source: Home / Self Care | Attending: Internal Medicine

## 2019-06-10 DIAGNOSIS — L02416 Cutaneous abscess of left lower limb: Secondary | ICD-10-CM

## 2019-06-10 HISTORY — PX: I & D EXTREMITY: SHX5045

## 2019-06-10 LAB — GLUCOSE, CAPILLARY
Glucose-Capillary: 194 mg/dL — ABNORMAL HIGH (ref 70–99)
Glucose-Capillary: 183 mg/dL — ABNORMAL HIGH (ref 70–99)
Glucose-Capillary: 183 mg/dL — ABNORMAL HIGH (ref 70–99)
Glucose-Capillary: 147 mg/dL — ABNORMAL HIGH (ref 70–99)
Glucose-Capillary: 265 mg/dL — ABNORMAL HIGH (ref 70–99)

## 2019-06-10 LAB — SURGICAL PCR SCREEN
MRSA, PCR: NEGATIVE
Staphylococcus aureus: NEGATIVE

## 2019-06-10 SURGERY — IRRIGATION AND DEBRIDEMENT EXTREMITY
Anesthesia: General | Site: Leg Lower | Laterality: Left

## 2019-06-10 MED ORDER — PROPOFOL 10 MG/ML IV BOLUS
INTRAVENOUS | Status: DC | PRN
  Administered 2019-06-10: 200 mg via INTRAVENOUS

## 2019-06-10 MED ORDER — SORBITOL 70 % SOLN: 30.0000 mL | Freq: Every day | Status: DC | PRN

## 2019-06-10 MED ORDER — OXYCODONE HCL 5 MG/5ML PO SOLN: 5.0000 mg | Freq: Once | ORAL | Status: AC | PRN

## 2019-06-10 MED ORDER — ASPIRIN 81 MG PO CHEW
81.0000 mg | CHEWABLE_TABLET | Freq: Two times a day (BID) | ORAL | Status: DC
  Administered 2019-06-10 – 2019-06-15 (×10): 81 mg via ORAL
  Filled 2019-06-10 (×10): qty 1

## 2019-06-10 MED ORDER — FENTANYL CITRATE (PF) 250 MCG/5ML IJ SOLN
INTRAMUSCULAR | Status: DC | PRN
  Administered 2019-06-10: 25 ug via INTRAVENOUS
  Administered 2019-06-10: 50 ug via INTRAVENOUS
  Administered 2019-06-10 (×2): 25 ug via INTRAVENOUS
  Administered 2019-06-10: 50 ug via INTRAVENOUS

## 2019-06-10 MED ORDER — METHOCARBAMOL 500 MG PO TABS
500.0000 mg | ORAL_TABLET | Freq: Four times a day (QID) | ORAL | Status: DC | PRN
  Administered 2019-06-10 – 2019-06-15 (×12): 500 mg via ORAL
  Filled 2019-06-10 (×12): qty 1

## 2019-06-10 MED ORDER — MAGNESIUM CITRATE PO SOLN: 1.0000 | Freq: Once | ORAL | Status: DC | PRN

## 2019-06-10 MED ORDER — OXYCODONE HCL 5 MG PO TABS
ORAL_TABLET | ORAL | Status: AC
  Filled 2019-06-10: qty 1

## 2019-06-10 MED ORDER — SODIUM CHLORIDE 0.9 % IR SOLN
Status: DC | PRN
  Administered 2019-06-10: 1000 mL
  Administered 2019-06-10: 3000 mL

## 2019-06-10 MED ORDER — SODIUM CHLORIDE 0.9 % IV SOLN
INTRAVENOUS | Status: DC
  Administered 2019-06-10 – 2019-06-14 (×3): via INTRAVENOUS

## 2019-06-10 MED ORDER — LABETALOL HCL 5 MG/ML IV SOLN
INTRAVENOUS | Status: AC
  Filled 2019-06-10: qty 4

## 2019-06-10 MED ORDER — ONDANSETRON HCL 4 MG PO TABS: 4.0000 mg | ORAL_TABLET | Freq: Four times a day (QID) | ORAL | Status: DC | PRN

## 2019-06-10 MED ORDER — MIDAZOLAM HCL 2 MG/2ML IJ SOLN: 0.5000 mg | Freq: Once | INTRAMUSCULAR | Status: DC | PRN

## 2019-06-10 MED ORDER — HYDROMORPHONE HCL 1 MG/ML IJ SOLN
0.2500 mg | INTRAMUSCULAR | Status: DC | PRN
  Administered 2019-06-10 (×3): 0.5 mg via INTRAVENOUS

## 2019-06-10 MED ORDER — HYDROMORPHONE HCL 1 MG/ML IJ SOLN
INTRAMUSCULAR | Status: AC
  Filled 2019-06-10: qty 1

## 2019-06-10 MED ORDER — ONDANSETRON HCL 4 MG/2ML IJ SOLN
4.0000 mg | Freq: Four times a day (QID) | INTRAMUSCULAR | Status: DC | PRN
  Administered 2019-06-13: 05:00:00 4 mg via INTRAVENOUS
  Filled 2019-06-10: qty 2

## 2019-06-10 MED ORDER — LACTATED RINGERS IV SOLN
INTRAVENOUS | Status: DC
  Administered 2019-06-10: 14:00:00 via INTRAVENOUS

## 2019-06-10 MED ORDER — METHOCARBAMOL 500 MG PO TABS
ORAL_TABLET | ORAL | Status: AC
  Filled 2019-06-10: qty 1

## 2019-06-10 MED ORDER — FENTANYL CITRATE (PF) 250 MCG/5ML IJ SOLN
INTRAMUSCULAR | Status: AC
  Filled 2019-06-10: qty 5

## 2019-06-10 MED ORDER — PROMETHAZINE HCL 25 MG/ML IJ SOLN: 6.2500 mg | INTRAMUSCULAR | Status: DC | PRN

## 2019-06-10 MED ORDER — OXYCODONE HCL 5 MG PO TABS
5.0000 mg | ORAL_TABLET | Freq: Once | ORAL | Status: AC | PRN
  Administered 2019-06-10: 5 mg via ORAL

## 2019-06-10 MED ORDER — DOCUSATE SODIUM 100 MG PO CAPS
100.0000 mg | ORAL_CAPSULE | Freq: Two times a day (BID) | ORAL | Status: DC
  Administered 2019-06-10 – 2019-06-15 (×10): 100 mg via ORAL
  Filled 2019-06-10 (×10): qty 1

## 2019-06-10 MED ORDER — DEXAMETHASONE SODIUM PHOSPHATE 10 MG/ML IJ SOLN
INTRAMUSCULAR | Status: DC | PRN
  Administered 2019-06-10: 5 mg via INTRAVENOUS

## 2019-06-10 MED ORDER — HYDROMORPHONE HCL 1 MG/ML IJ SOLN
0.2500 mg | INTRAMUSCULAR | Status: DC | PRN
  Administered 2019-06-10 (×4): 0.5 mg via INTRAVENOUS

## 2019-06-10 MED ORDER — MIDAZOLAM HCL 2 MG/2ML IJ SOLN
INTRAMUSCULAR | Status: DC | PRN
  Administered 2019-06-10: 2 mg via INTRAVENOUS

## 2019-06-10 MED ORDER — MEPERIDINE HCL 25 MG/ML IJ SOLN: 6.2500 mg | INTRAMUSCULAR | Status: DC | PRN

## 2019-06-10 MED ORDER — ONDANSETRON HCL 4 MG/2ML IJ SOLN
INTRAMUSCULAR | Status: DC | PRN
  Administered 2019-06-10: 4 mg via INTRAVENOUS

## 2019-06-10 MED ORDER — LABETALOL HCL 5 MG/ML IV SOLN
10.0000 mg | INTRAVENOUS | Status: AC | PRN
  Administered 2019-06-10 (×2): 10 mg via INTRAVENOUS

## 2019-06-10 MED ORDER — VANCOMYCIN HCL 1000 MG IV SOLR
INTRAVENOUS | Status: AC
  Filled 2019-06-10: qty 1000

## 2019-06-10 MED ORDER — METOCLOPRAMIDE HCL 5 MG/ML IJ SOLN: 5.0000 mg | Freq: Three times a day (TID) | INTRAMUSCULAR | Status: DC | PRN

## 2019-06-10 MED ORDER — KETOROLAC TROMETHAMINE 30 MG/ML IJ SOLN
INTRAMUSCULAR | Status: AC
  Filled 2019-06-10: qty 1

## 2019-06-10 MED ORDER — METOCLOPRAMIDE HCL 5 MG PO TABS: 5.0000 mg | ORAL_TABLET | Freq: Three times a day (TID) | ORAL | Status: DC | PRN

## 2019-06-10 MED ORDER — POLYETHYLENE GLYCOL 3350 17 G PO PACK
17.0000 g | PACK | Freq: Every day | ORAL | Status: DC | PRN
  Filled 2019-06-10: qty 1

## 2019-06-10 MED ORDER — VANCOMYCIN HCL 1000 MG IV SOLR
INTRAVENOUS | Status: DC | PRN
  Administered 2019-06-10: 1000 mg

## 2019-06-10 MED ORDER — CEFAZOLIN SODIUM-DEXTROSE 2-4 GM/100ML-% IV SOLN
2.0000 g | Freq: Four times a day (QID) | INTRAVENOUS | Status: AC
  Administered 2019-06-10 – 2019-06-11 (×3): 2 g via INTRAVENOUS
  Filled 2019-06-10 (×4): qty 100

## 2019-06-10 MED ORDER — LIDOCAINE 2% (20 MG/ML) 5 ML SYRINGE
INTRAMUSCULAR | Status: DC | PRN
  Administered 2019-06-10: 60 mg via INTRAVENOUS

## 2019-06-10 MED ORDER — DIPHENHYDRAMINE HCL 12.5 MG/5ML PO ELIX: 25.0000 mg | ORAL_SOLUTION | ORAL | Status: DC | PRN

## 2019-06-10 MED ORDER — MIDAZOLAM HCL 2 MG/2ML IJ SOLN
INTRAMUSCULAR | Status: AC
  Filled 2019-06-10: qty 2

## 2019-06-10 MED ORDER — METHOCARBAMOL 1000 MG/10ML IJ SOLN
500.0000 mg | Freq: Four times a day (QID) | INTRAVENOUS | Status: DC | PRN
  Filled 2019-06-10: qty 5

## 2019-06-10 SURGICAL SUPPLY — 54 items
BANDAGE ACE 3X5.8 VEL STRL LF (GAUZE/BANDAGES/DRESSINGS) IMPLANT
BANDAGE ELASTIC 6 VELCRO ST LF (GAUZE/BANDAGES/DRESSINGS) ×2 IMPLANT
BANDAGE ESMARK 6X9 LF (GAUZE/BANDAGES/DRESSINGS) IMPLANT
BNDG CMPR 9X6 STRL LF SNTH (GAUZE/BANDAGES/DRESSINGS) ×1
BNDG COHESIVE 4X5 TAN STRL (GAUZE/BANDAGES/DRESSINGS) ×3 IMPLANT
BNDG COHESIVE 6X5 TAN STRL LF (GAUZE/BANDAGES/DRESSINGS) ×6 IMPLANT
BNDG ESMARK 6X9 LF (GAUZE/BANDAGES/DRESSINGS) ×3
BNDG GAUZE ELAST 4 BULKY (GAUZE/BANDAGES/DRESSINGS) ×2 IMPLANT
BNDG GAUZE STRTCH 6 (GAUZE/BANDAGES/DRESSINGS) ×3 IMPLANT
COVER SURGICAL LIGHT HANDLE (MISCELLANEOUS) ×3 IMPLANT
COVER WAND RF STERILE (DRAPES) ×3 IMPLANT
CUFF TOURN SGL QUICK 24 (TOURNIQUET CUFF)
CUFF TOURN SGL QUICK 34 (TOURNIQUET CUFF) ×3
CUFF TOURN SGL QUICK 42 (TOURNIQUET CUFF) IMPLANT
CUFF TRNQT CYL 24X4X16.5-23 (TOURNIQUET CUFF) IMPLANT
CUFF TRNQT CYL 34X4.125X (TOURNIQUET CUFF) IMPLANT
DRAPE U-SHAPE 47X51 STRL (DRAPES) ×3 IMPLANT
DURAPREP 26ML APPLICATOR (WOUND CARE) ×3 IMPLANT
ELECT REM PT RETURN 9FT ADLT (ELECTROSURGICAL)
ELECTRODE REM PT RTRN 9FT ADLT (ELECTROSURGICAL) IMPLANT
GAUZE SPONGE 4X4 12PLY STRL (GAUZE/BANDAGES/DRESSINGS) ×4 IMPLANT
GAUZE XEROFORM 5X9 LF (GAUZE/BANDAGES/DRESSINGS) ×3 IMPLANT
GLOVE BIOGEL PI IND STRL 7.0 (GLOVE) ×1 IMPLANT
GLOVE BIOGEL PI INDICATOR 7.0 (GLOVE) ×2
GLOVE ECLIPSE 7.0 STRL STRAW (GLOVE) ×3 IMPLANT
GLOVE SKINSENSE NS SZ7.5 (GLOVE) ×4
GLOVE SKINSENSE STRL SZ7.5 (GLOVE) ×2 IMPLANT
GOWN STRL REIN XL XLG (GOWN DISPOSABLE) ×6 IMPLANT
HANDPIECE INTERPULSE COAX TIP (DISPOSABLE)
KIT BASIN OR (CUSTOM PROCEDURE TRAY) ×3 IMPLANT
KIT TURNOVER KIT B (KITS) ×3 IMPLANT
MANIFOLD NEPTUNE II (INSTRUMENTS) ×3 IMPLANT
PACK ORTHO EXTREMITY (CUSTOM PROCEDURE TRAY) ×3 IMPLANT
PAD ABD 8X10 STRL (GAUZE/BANDAGES/DRESSINGS) ×3 IMPLANT
PAD ARMBOARD 7.5X6 YLW CONV (MISCELLANEOUS) ×6 IMPLANT
PADDING CAST ABS 4INX4YD NS (CAST SUPPLIES) ×2
PADDING CAST ABS COTTON 4X4 ST (CAST SUPPLIES) ×1 IMPLANT
PADDING CAST COTTON 6X4 STRL (CAST SUPPLIES) ×3 IMPLANT
SET HNDPC FAN SPRY TIP SCT (DISPOSABLE) IMPLANT
SPONGE LAP 18X18 RF (DISPOSABLE) ×3 IMPLANT
STOCKINETTE IMPERVIOUS 9X36 MD (GAUZE/BANDAGES/DRESSINGS) ×3 IMPLANT
SUT ETHILON 2 0 FS 18 (SUTURE) ×3 IMPLANT
SUT ETHILON 2 0 PSLX (SUTURE) IMPLANT
SUT VIC AB 2-0 FS1 27 (SUTURE) ×6 IMPLANT
SWAB COLLECTION DEVICE MRSA (MISCELLANEOUS) ×2 IMPLANT
SWAB CULTURE ESWAB REG 1ML (MISCELLANEOUS) ×2 IMPLANT
TOWEL GREEN STERILE (TOWEL DISPOSABLE) ×3 IMPLANT
TOWEL GREEN STERILE FF (TOWEL DISPOSABLE) ×3 IMPLANT
TUBE CONNECTING 12'X1/4 (SUCTIONS) ×1
TUBE CONNECTING 12X1/4 (SUCTIONS) ×2 IMPLANT
TUBING TUR DISP (UROLOGICAL SUPPLIES) ×3 IMPLANT
UNDERPAD 30X30 (UNDERPADS AND DIAPERS) ×6 IMPLANT
WATER STERILE IRR 1000ML POUR (IV SOLUTION) ×3 IMPLANT
YANKAUER SUCT BULB TIP NO VENT (SUCTIONS) ×3 IMPLANT

## 2019-06-10 NOTE — Transfer of Care (Signed)
Immediate Anesthesia Transfer of Care Note  Patient: Anthony Hubbard  Procedure(s) Performed: IRRIGATION AND DEBRIDEMENT LEFT LOWER EXTREMITY (Left Leg Lower)  Patient Location: PACU  Anesthesia Type:General  Level of Consciousness: awake, alert  and oriented  Airway & Oxygen Therapy: Patient Spontanous Breathing  Post-op Assessment: Report given to RN and Post -op Vital signs reviewed and stable  Post vital signs: Reviewed and stable  Last Vitals:  Vitals Value Taken Time  BP 153/109 06/10/19 1812  Temp    Pulse 116 06/10/19 1815  Resp 30 06/10/19 1815  SpO2 94 % 06/10/19 1815  Vitals shown include unvalidated device data.  Last Pain:  Vitals:   06/10/19 0845  TempSrc:   PainSc: 0-No pain      Patients Stated Pain Goal: 4 (73/22/02 5427)  Complications: No apparent anesthesia complications

## 2019-06-10 NOTE — Progress Notes (Signed)
Anthony Hubbard.  PROGRESS NOTE    Anthony Hubbard  WUJ:[id number] DOB: 28-Nov-1985 DOA: 06/07/2019 PCP: Maudie FlakesAnderson, Shane D, FNP   Brief Narrative:   Anthony PheasantCarlos Hubbard a 33 y.o.Mwith hx IDDM, HTN not currently treatedwho presents with worsening left leg swelling, redness and warmth, failing Augmentin.  The patient was in his usual state of health until mid June, he developed a spontaneous red painful swelling on the front of his left shin. This persisted, gradually getting worse until about 1 week ago, when he developed a much larger red painful swelling just medial to it. He went to urgent care, where he was started on Augmentin. He then came here to the emergency room 7/11, had an MR of the left lower extremity as well as ultrasound of the left lower extremity that showed a superficial venous thrombosis by (US)andmild cellulitis and myositis of the left calf without abscess.  He was benign in appearance and was discharged to continue with Augmentin and follow up with orthopedics, which he was not able to do due to having no insurance.  Now in the last three days, despite taking Augmentin BID as prescribed for last 6 days, he has worsening severe left calf pain, worse with plantarflexion, associated with warmth and swelling. He has had no firmness of the calf as a whole, and no numbness or loss of function of toes. Intermittent fever, no respiratory symptoms.  In the ER, he was tachycardic to 120s, BP 180s systolic, WBC 14K. Lactate normal. CK normal. He was given IVF, vancomycin and Zosyn. His case was discussed with orthopedics, who will evaluate patient in AM.   Assessment & Plan:   Principal Problem:   Cellulitis and abscess of left leg Active Problems:   Sepsis (HCC)   Type 2 diabetes mellitus with hyperglycemia, with long-term current use of insulin (HCC)   Essential hypertension   Hypertensive urgency   Sepsis from left leg cellulitis     - No clinical suspicion for  compartment syndrome due to benign appearing calf, neurovascularly intact, slow clinical course.     - Continue IV fluids     - Continue vancomycin, ceftriaxone and Flagyl     -Repeat MRI shows lower extremity abscess.     -Seen by orthopedics and underwent irrigation debridement of left lower leg abscess.  Follow-up intraoperative cultures.  Diabetes with hyperglycemia     - Hyperglycemic on admission, AG normal.       - Not taking metformin, Ozempic or Tresiba since being laid off because he can't afford them.     - now on NPH 15 units BID in anticipation of d/c with affordable insulin ([facility]; DM coordinator to see); increase NPH to 20 units BID     - SSI correction insulin     - A1c 11.9 -Currently blood sugars are stable  Hypertension/Hypertensive urgency     - BP 181/130 at admission.     - Improving in the ER just with pain control.     - No evidence of end organ damage.     - home HCTZ and losartan     - SW consult for meds access at d/c given no insurance (if unable to get these meds, [facility] has free lisinopril/norvasc)     - increase losartan to 100mg  daily, watch potassium  Superficial venous thrombosis     - Analgesics as needed     - May have Kpad or other hot compress PRN   DVT  prophylaxis: Lovenox Code Status: FULL   Disposition Plan: TBD   Consultants:   Orthopedics   Antimicrobials:  Clement Husbands. Vanc, rocephin, flagyl    Subjective: Patient is seen in room for procedure.  Reports that pain is controlled.  Wants something to eat or drink  Objective: Vitals:   06/10/19 1945 06/10/19 2000 06/10/19 2015 06/10/19 2034  BP: (!) 161/110 (!) 153/104 (!) 144/99 (!) 152/97  Pulse: (!) 102 95 91 91  Resp: 20 (!) 24 (!) 22 12  Temp:  97.8 F (36.6 C)  98.1 F (36.7 C)  TempSrc:    Oral  SpO2: 97% 98% 98% 98%  Weight:      Height:        Intake/Output Summary (Last 24 hours) at 06/10/2019 2110 Last data filed at 06/10/2019  1807 Gross per 24 hour  Intake 1315.17 ml  Output 1205 ml  Net 110.17 ml   Filed Weights   06/07/19 2332 06/10/19 1425  Weight: 105.9 kg 105.9 kg    Examination:  General exam: Alert, awake, oriented x 3 Respiratory system: Clear to auscultation. Respiratory effort normal. Cardiovascular system:RRR. No murmurs, rubs, gallops. Gastrointestinal system: Abdomen is nondistended, soft and nontender. No organomegaly or masses felt. Normal bowel sounds heard. Central nervous system: Alert and oriented. No focal neurological deficits. Extremities: Left lower extremities wrapped in dressings. Skin: No rashes, lesions or ulcers Psychiatry: Judgement and insight appear normal. Mood & affect appropriate.      Data Reviewed: I have personally reviewed following labs and imaging studies.  CBC: Recent Labs  Lab 06/07/19 1825 06/08/19 0319 06/09/19 0341  WBC 14.6* 13.8* 11.2*  NEUTROABS 10.4*  --  6.8  HGB 18.5* 15.6 15.5  HCT 56.7* 48.8 46.9  MCV 85.6 87.6 86.1  PLT 369 239 288   Basic Metabolic Panel: Recent Labs  Lab 06/07/19 1825 06/08/19 0319 06/09/19 0341  NA 135 134* 135  K 4.6 5.4* 3.7  CL 95* 101 99  CO2 25 23 22   GLUCOSE 355* 315* 273*  BUN 21* 20 15  CREATININE 1.16 1.04 0.84  CALCIUM 10.2 9.1 9.3  MG  --   --  1.6*  PHOS  --   --  4.1   GFR: Estimated Creatinine Clearance: 151.7 mL/min (by C-G formula based on SCr of 0.84 mg/dL). Liver Function Tests: Recent Labs  Lab 06/07/19 1825 06/09/19 0341  AST 31  --   ALT 44  --   ALKPHOS 121  --   BILITOT 0.7  --   PROT 9.9*  --   ALBUMIN 4.6 3.3*   No results for input(s): LIPASE, AMYLASE in the last 168 hours. No results for input(s): AMMONIA in the last 168 hours. Coagulation Profile: No results for input(s): INR, PROTIME in the last 168 hours. Cardiac Enzymes: Recent Labs  Lab 06/07/19 2019  CKTOTAL 74   BNP (last 3 results) No results for input(s): PROBNP in the last 8760 hours. HbA1C:  Recent Labs    06/08/19 0319  HGBA1C 11.9*   CBG: Recent Labs  Lab 06/09/19 2146 06/10/19 0815 06/10/19 1225 06/10/19 1430 06/10/19 1814  GLUCAP 189* 194* 183* 183* 147*   Lipid Profile: No results for input(s): CHOL, HDL, LDLCALC, TRIG, CHOLHDL, LDLDIRECT in the last 72 hours. Thyroid Function Tests: No results for input(s): TSH, T4TOTAL, FREET4, T3FREE, THYROIDAB in the last 72 hours. Anemia Panel: No results for input(s): VITAMINB12, FOLATE, FERRITIN, TIBC, IRON, RETICCTPCT in the last 72 hours. Sepsis Labs: Recent Labs  Lab 06/07/19 1825  LATICACIDVEN 1.5    Recent Results (from the past 240 hour(s))  Culture, blood (routine x 2)     Status: None (Preliminary result)   Collection Time: 06/07/19  7:26 PM   Specimen: BLOOD RIGHT FOREARM  Result Value Ref Range Status   Specimen Description   Final    BLOOD RIGHT FOREARM Performed at [facility], [address] [place], [place] [address]    Special Requests   Final    BOTTLES DRAWN AEROBIC AND ANAEROBIC Blood Culture adequate volume Performed at [facility], [address] [place], [place] [address]    Culture   Final    NO GROWTH 2 DAYS Performed at [facility], [address], [place], [place] [address]    Report Status PENDING  Incomplete  Culture, blood (routine x 2)     Status: None (Preliminary result)   Collection Time: 06/07/19  7:26 PM   Specimen: BLOOD  Result Value Ref Range Status   Specimen Description   Final    BLOOD LEFT ANTECUBITAL Performed at [facility], [address] [place], [place] [address]    Special Requests   Final    BOTTLES DRAWN AEROBIC AND ANAEROBIC Blood Culture adequate volume Performed at [facility], [address] [place], [place] [address]    Culture   Final    NO GROWTH 2 DAYS Performed at [facility], [address] [place], [place] [address]    Report Status PENDING  Incomplete   SARS Coronavirus 2 (CEPHEID - Performed in [facility] hospital lab), Hosp Order     Status: None   Collection Time: 06/07/19  9:00 PM   Specimen: Nasopharyngeal Swab  Result Value Ref Range Status   SARS Coronavirus 2 NEGATIVE NEGATIVE Final    Comment: (NOTE) If result is NEGATIVE SARS-CoV-2 target nucleic acids are NOT DETECTED. The SARS-CoV-2 RNA is generally detectable in upper and lower  respiratory specimens during the acute phase of infection. The lowest  concentration of SARS-CoV-2 viral copies this assay can detect is 250  copies / mL. A negative result does not preclude SARS-CoV-2 infection  and should not be used as the sole basis for treatment or other  patient management decisions.  A negative result may occur with  improper specimen collection / handling, submission of specimen other  than nasopharyngeal swab, presence of viral mutation(s) within the  areas targeted by this assay, and inadequate number of viral copies  (<250 copies / mL). A negative result must be combined with clinical  observations, patient history, and epidemiological information. If result is POSITIVE SARS-CoV-2 target nucleic acids are DETECTED. The SARS-CoV-2 RNA is generally detectable in upper and lower  respiratory specimens dur ing the acute phase of infection.  Positive  results are indicative of active infection with SARS-CoV-2.  Clinical  correlation with patient history and other diagnostic information is  necessary to determine patient infection status.  Positive results do  not rule out bacterial infection or co-infection with other viruses. If result is PRESUMPTIVE POSTIVE SARS-CoV-2 nucleic acids MAY BE PRESENT.   A presumptive positive result was obtained on the submitted specimen  and confirmed on repeat testing.  While 2019 novel coronavirus  (SARS-CoV-2) nucleic acids may be present in the submitted sample  additional confirmatory testing may be necessary for epidemiological   and / or clinical management purposes  to differentiate between  SARS-CoV-2 and  other Sarbecovirus currently known to infect humans.  If clinically indicated additional testing with an alternate test  methodology [phone]([id number]) is advised. The SARS-CoV-2 RNA is generally  detectable in upper and lower respiratory sp ecimens during the acute  phase of infection. The expected result is Negative. Fact Sheet for Patients:  BoilerBrush.com.cyhttps://www.fda.gov/media/136312/download Fact Sheet for Healthcare Providers: https://pope.com/https://www.fda.gov/media/136313/download This test is not yet approved or cleared by the [place] FDA and has been authorized for detection and/or diagnosis of SARS-CoV-2 by FDA under an Emergency Use Authorization (EUA).  This EUA will remain in effect (meaning this test can be used) for the duration of the COVID-19 declaration under Section 564(b)(1) of the Act, 21 U.S.C. section 360bbb-3(b)(1), unless the authorization is terminated or revoked sooner. Performed at [facility], [address] [place], [place] [address]   Surgical pcr screen     Status: None   Collection Time: 06/10/19  1:04 AM   Specimen: Nasal Mucosa; Nasal Swab  Result Value Ref Range Status   MRSA, PCR NEGATIVE NEGATIVE Final   Staphylococcus aureus NEGATIVE NEGATIVE Final    Comment: (NOTE) The Xpert SA Assay (FDA approved for NASAL specimens in patients 31 years of age and older), is one component of a comprehensive surveillance program. It is not intended to diagnose infection nor to guide or monitor treatment. Performed at [facility], [address] [place], [place] [address]      Radiology Studies: Mr Tibia Fibula Left W Wo Contrast  Result Date: 06/10/2019 CLINICAL DATA:  Continued leg pain and swelling. EXAM: MRI OF LOWER LEFT EXTREMITY WITHOUT AND WITH CONTRAST TECHNIQUE: Multiplanar, multisequence MR imaging of the left lower leg was performed both before and after  administration of intravenous contrast. CONTRAST:  10 mL Gadavist COMPARISON:  06/02/2019 FINDINGS: Bones/Joint/Cartilage No focal marrow signal abnormality. No periosteal reaction or bone destruction. No fracture or dislocation. Normal alignment. No joint effusion. Ligaments Collateral ligaments are intact. Muscles and Tendons Muscle edema with enhancement and perifascial edema in the peripheral medial most aspect of soleus muscle and flexor digitorum muscle located adjacent to a complex fluid collection. Remainder of the muscles demonstrate no focal abnormality. No areas of muscle necrosis. Soft tissue 1.2 x 6 x 7.2 cm complex fluid collection with peripheral enhancement and a few loculations in the subcutaneous fat of the medial aspect of the mid left lower leg most concerning for an abscess. Surrounding soft tissue edema primarily along the entire length of the medial aspect of the left lower leg concerning for cellulitis. No soft tissue mass. IMPRESSION: 1. No osteomyelitis of the left lower leg. 1.2 x 6 x 7.2 cm abscess in the subcutaneous fat of the medial aspect of the mid left lower leg with surrounding cellulitis. 2. Muscle edema and mild perifascial edema involving the peripheral medial most aspect of the soleus muscle and flexor digitorum muscle adjacent to the abscess. Differential considerations include reactive inflammatory edema secondary to adjacent inflammation versus mild early infectious myositis. Electronically Signed   By: Elige KoHetal  Patel   On: 06/10/2019 08:04     Scheduled Meds: . aspirin  81 mg Oral BID  . docusate sodium  100 mg Oral BID  . hydrochlorothiazide  12.5 mg Oral Daily  . HYDROmorphone      . HYDROmorphone      . HYDROmorphone      . HYDROmorphone      . insulin aspart  0-15 Units Subcutaneous TID WC  . insulin aspart  0-5 Units Subcutaneous QHS  . insulin NPH Human  20 Units Subcutaneous BID AC & HS  . ketorolac      . labetalol      . losartan  100 mg Oral Daily   . magnesium oxide  400 mg Oral BID  . methocarbamol      . oxyCODONE      . oxyCODONE       Continuous Infusions: . sodium chloride Stopped (06/09/19 1742)  . sodium chloride    .  ceFAZolin (ANCEF) IV    . cefTRIAXone (ROCEPHIN)  IV Stopped (06/09/19 2040)  . methocarbamol (ROBAXIN) IV    . metronidazole 500 mg (06/10/19 0456)  . vancomycin 1,000 mg (06/10/19 1330)     LOS: 3 days    Time spent: 25 minutes spent in the coordination of care today.    Kathie Dike, MD [facility]  If 7PM-7AM, please contact night-coverage [email or web address]  06/10/2019, 9:10 PM

## 2019-06-10 NOTE — Plan of Care (Signed)
  Problem: Clinical Measurements: Goal: Ability to maintain clinical measurements within normal limits will improve Outcome: Progressing   Problem: Nutrition: Goal: Adequate nutrition will be maintained Outcome: Progressing   Problem: Elimination: Goal: Will not experience complications related to urinary retention Outcome: Progressing   Problem: Pain Managment: Goal: General experience of comfort will improve Outcome: Progressing   

## 2019-06-10 NOTE — Progress Notes (Signed)
Report attempted x 1

## 2019-06-10 NOTE — Anesthesia Preprocedure Evaluation (Addendum)
Anesthesia Evaluation  Patient identified by MRN, date of birth, ID band Patient awake    Reviewed: Allergy & Precautions, NPO status , Patient's Chart, lab work & pertinent test results  History of Anesthesia Complications Negative for: history of anesthetic complications  Airway Mallampati: II  TM Distance: >3 FB Neck ROM: Full    Dental  (+) Chipped, Dental Advisory Given, Loose   Pulmonary neg pulmonary ROS,  06/07/2019 SARS coronavirus NEG   breath sounds clear to auscultation       Cardiovascular hypertension, Pt. on medications (-) angina Rhythm:Regular Rate:Normal     Neuro/Psych negative neurological ROS     GI/Hepatic negative GI ROS, (+)     substance abuse  marijuana use,   Endo/Other  diabetes (glu 183), Oral Hypoglycemic AgentsMorbid obesity  Renal/GU negative Renal ROS     Musculoskeletal   Abdominal (+) + obese,   Peds  Hematology negative hematology ROS (+)   Anesthesia Other Findings   Reproductive/Obstetrics                            Anesthesia Physical Anesthesia Plan  ASA: III  Anesthesia Plan: General   Post-op Pain Management:    Induction: Intravenous  PONV Risk Score and Plan: 2 and Ondansetron and Dexamethasone  Airway Management Planned: LMA  Additional Equipment:   Intra-op Plan:   Post-operative Plan:   Informed Consent: I have reviewed the patients History and Physical, chart, labs and discussed the procedure including the risks, benefits and alternatives for the proposed anesthesia with the patient or authorized representative who has indicated his/her understanding and acceptance.     Dental advisory given  Plan Discussed with: CRNA and Surgeon  Anesthesia Plan Comments:         Anesthesia Quick Evaluation

## 2019-06-10 NOTE — H&P (Signed)

## 2019-06-10 NOTE — Progress Notes (Signed)
Notified MD Ermalene Postin pt still complaining of a 6-7/10 pain after 2mg  Dilaudid, 5 Oxy IR, 500 Robaxin, 30 Toradol.  BP Diastolics are 718 with MAPs in the 100's.  MD gave new pain medication orders and labetalol if needed.  Will administer and continue to monitor.

## 2019-06-10 NOTE — Anesthesia Procedure Notes (Signed)
Procedure Name: LMA Insertion Date/Time: 06/10/2019 5:29 PM Performed by: Clearnce Sorrel, CRNA Pre-anesthesia Checklist: Patient identified, Emergency Drugs available, Suction available, Patient being monitored and Timeout performed Patient Re-evaluated:Patient Re-evaluated prior to induction Oxygen Delivery Method: Circle system utilized Preoxygenation: Pre-oxygenation with 100% oxygen Induction Type: IV induction LMA: LMA inserted LMA Size: 4.0 Number of attempts: 1 Placement Confirmation: positive ETCO2 and breath sounds checked- equal and bilateral Tube secured with: Tape Dental Injury: Teeth and Oropharynx as per pre-operative assessment

## 2019-06-10 NOTE — Social Work (Signed)
CSW acknowledging consult for access to medications at discharge.  For medication access CSW will work with [facility] RNCM to determine if pt is eligible for [place], MSW, [place] Work [phone]

## 2019-06-10 NOTE — Progress Notes (Signed)
Received pt in unit (6N) from [employer], AOx4.  VSS: BP: 154/90 (106), PR: 107, RR: 18, Temp: 37.5, in moderate pain. Given toradol 30 mg, tolerated well. Oriented to room and plan of care. Call bell at reach, left comfortably in bed. Will continue to monitor

## 2019-06-10 NOTE — Op Note (Signed)
   Date of Surgery: 06/10/2019  INDICATIONS: [name] is a 33 y.o.-year-old male with a left lower leg abscess;  The patient did consent to the procedure after discussion of the risks and benefits.  PREOPERATIVE DIAGNOSIS: Left lower leg abscess  POSTOPERATIVE DIAGNOSIS: Same.  PROCEDURE: Irrigation and debridement of left lower leg abscess, 85 cm  SURGEON: N. Eduard Roux, M.D.  ASSIST: Ciro Backer [place], [place]; necessary for the timely completion of procedure and due to complexity of procedure.  ANESTHESIA:  general  IV FLUIDS AND URINE: See anesthesia.  ESTIMATED BLOOD LOSS: Minimal mL.  IMPLANTS: None  DRAINS: None  COMPLICATIONS: see description of procedure.  DESCRIPTION OF PROCEDURE: The patient was brought to the operating room and placed supine on the operating table.  The patient had been signed prior to the procedure and this was documented. The patient had the anesthesia placed by the anesthesiologist.  A time-out was performed to confirm that this was the correct patient, site, side and location. The patient did receive antibiotics prior to the incision and was re-dosed during the procedure as needed at indicated intervals.  A tourniquet was placed.  The patient had the operative extremity prepped and draped in the standard surgical fashion.    An incision was made directly over the fluctuant area on the skin.  There was return of a large amount of frank pus which was cultured.  I then performed sharp excisional debridement of the skin, subcutaneous tissue, periosteum of the tibia with a rongeur and curette.  I then thoroughly irrigated the abscess pocket with 3 L of normal saline using pulse lavage.  Tourniquet was deflated and hemostasis was obtained.  1 g of vancomycin powder was placed throughout the wound.  The wound was then left open and packed with wet-to-dry dressings.  Sterile dressings were applied.  Patient tolerated procedure well had no new  complications.  POSTOPERATIVE PLAN: Patient will need to undergo wet-to-dry dressing changes twice a day with packing of the wound.  We will follow up on the cultures.  Recommend consultation with the infectious disease team for culture directed antibiotic therapy.  [name], MD 6:01 PM

## 2019-06-10 NOTE — Anesthesia Postprocedure Evaluation (Signed)
Anesthesia Post Note  Patient: Anthony Hubbard  Procedure(s) Performed: IRRIGATION AND DEBRIDEMENT LEFT LOWER EXTREMITY (Left Leg Lower)     Patient location during evaluation: PACU Anesthesia Type: General Level of consciousness: patient cooperative Pain management: pain level controlled Vital Signs Assessment: post-procedure vital signs reviewed and stable Respiratory status: spontaneous breathing, nonlabored ventilation, respiratory function stable and patient connected to nasal cannula oxygen Cardiovascular status: blood pressure returned to baseline and stable Postop Assessment: no apparent nausea or vomiting Anesthetic complications: no    Last Vitals:  Vitals:   06/10/19 1826 06/10/19 1841  BP: 130/87 (!) 140/94  Pulse: 95 90  Resp: 20 18  Temp:    SpO2: 96% 98%    Last Pain:  Vitals:   06/10/19 1830  TempSrc:   PainSc: Asleep    LLE Motor Response: Purposeful movement;Responds to commands(wiggles toes) (06/10/19 1841) LLE Sensation: Full sensation (06/10/19 1841)          Marla Pouliot

## 2019-06-10 NOTE — TOC Initial Note (Signed)
Transition of Care [facility]) - Initial/Assessment Note    Patient Details  Name: Anthony Hubbard MRN: [id number] Date of Birth: [birth year]  Transition [facility] Care [facility]) CM/SW Contact:    Marilu Favre, RN Phone Number: 06/10/2019, 11:05 AM  Clinical Narrative:                 Spoke to patient at bedside, confirmed face sheet information.   Discussed [facility] , patient does want to follow up there. Appointment scheduled for June 24, 2019 at 0830 am.   [facility] letter. Patient states he has $3 co pay per prescription.   MD please send scripts to transitions of care pharmacy.   Patient does not have a machine at home to check his blood sugar. NCM has been told [facility] has same for $10 . Patient stated he is able to go to [place] and purchase the same.   If patient is discharged with wet to dry dressing changes he and his girl friend will be taught wound care.   Patient voiced understanding to all of above.   NCM did place VAC application on shadow chart, just in case for MD signature .   Expected Discharge Plan: Home/Self Care Barriers to Discharge: Continued Medical Work up   Patient Goals and CMS Choice Patient states their goals for this hospitalization and ongoing recovery are:: to go home CMS Medicare.gov Compare Post Acute Care list provided to:: Patient Choice offered to / list presented to : NA  Expected Discharge Plan and Services Expected Discharge Plan: Home/Self Care In-house Referral: Financial Counselor Discharge Planning Services: Medication Assistance, [facility], [facility] Program, Follow-up appt scheduled                     DME Arranged: N/A         HH Arranged: NA          Prior Living Arrangements/Services   Lives with:: Significant Other Patient language and need for interpreter reviewed:: Yes Do you feel safe going back to the place where you live?: Yes      Need for Family Participation in Patient  Care: No (Comment) Care giver support system in place?: Yes (comment)   Criminal Activity/Legal Involvement Pertinent to Current Situation/Hospitalization: No - Comment as needed  Activities of Daily Living Home Assistive Devices/Equipment: None ADL Screening (condition at time of admission) Patient's cognitive ability adequate to safely complete daily activities?: Yes Is the patient deaf or have difficulty hearing?: No Does the patient have difficulty seeing, even when wearing glasses/contacts?: No Does the patient have difficulty concentrating, remembering, or making decisions?: No Patient able to express need for assistance with ADLs?: Yes Does the patient have difficulty dressing or bathing?: No Independently performs ADLs?: Yes (appropriate for developmental age) Does the patient have difficulty walking or climbing stairs?: No Weakness of Legs: None Weakness of Arms/Hands: None  Permission Sought/Granted   Permission granted to share information with : Yes, Verbal Permission Granted     Permission granted to share info w AGENCY: [facility]        Emotional Assessment Appearance:: Appears stated age Attitude/Demeanor/Rapport: Engaged Affect (typically observed): Accepting Orientation: : Oriented to Self, Oriented to Place, Oriented to  Time, Oriented to Situation Alcohol / Substance Use: Not Applicable Psych Involvement: No (comment)  Admission diagnosis:  Cellulitis of left lower extremity [L03.116] 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 Sepsis (HCC) 06/07/2019  . Cellulitis and abscess of left leg 06/07/2019  . Type 2 diabetes mellitus with hyperglycemia, with long-term current use of insulin (HCC) 06/07/2019  . Essential hypertension 06/07/2019  . Hypertensive urgency 06/07/2019   PCP:  Maudie FlakesAnderson, Shane D, FNP Pharmacy:   [facility] [phone] - [place], [place]  - [address] [facility] [id number] [place] [address] Phone: [phone] Fax: [phone]  [facility] [place] [address] [place] [address] Phone: [phone] Fax: [phone]     Social Determinants of Health (SDOH) Interventions    Readmission Risk Interventions No flowsheet data found.

## 2019-06-11 ENCOUNTER — Encounter (HOSPITAL_COMMUNITY): Payer: Self-pay | Admitting: Orthopaedic Surgery

## 2019-06-11 LAB — COMPREHENSIVE METABOLIC PANEL
ALT: 42 U/L (ref 0–44)
AST: 32 U/L (ref 15–41)
Albumin: 3.3 g/dL — ABNORMAL LOW (ref 3.5–5.0)
Alkaline Phosphatase: 87 U/L (ref 38–126)
Anion gap: 15 (ref 5–15)
BUN: 19 mg/dL (ref 6–20)
CO2: 21 mmol/L — ABNORMAL LOW (ref 22–32)
Calcium: 9.3 mg/dL (ref 8.9–10.3)
Chloride: 95 mmol/L — ABNORMAL LOW (ref 98–111)
Creatinine, Ser: 1.22 mg/dL (ref 0.61–1.24)
GFR calc Af Amer: >60 mL/min (ref >60)
GFR calc non Af Amer: >60 mL/min (ref >60)
Glucose, Bld: 419 mg/dL — ABNORMAL HIGH (ref 70–99)
Potassium: 4.2 mmol/L (ref 3.5–5.1)
Sodium: 131 mmol/L — ABNORMAL LOW (ref 135–145)
Total Bilirubin: 0.9 mg/dL (ref 0.3–1.2)
Total Protein: 7.3 g/dL (ref 6.5–8.1)

## 2019-06-11 LAB — VANCOMYCIN, TROUGH: Vancomycin Tr: 15 ug/mL (ref 15–20)

## 2019-06-11 LAB — VANCOMYCIN, PEAK
Vancomycin Pk: 54 ug/mL (ref 30–40)
Vancomycin Pk: 39 ug/mL (ref 30–40)

## 2019-06-11 LAB — MAGNESIUM: Magnesium: 1.5 mg/dL — ABNORMAL LOW (ref 1.7–2.4)

## 2019-06-11 LAB — GLUCOSE, CAPILLARY
Glucose-Capillary: 342 mg/dL — ABNORMAL HIGH (ref 70–99)
Glucose-Capillary: 262 mg/dL — ABNORMAL HIGH (ref 70–99)
Glucose-Capillary: 191 mg/dL — ABNORMAL HIGH (ref 70–99)
Glucose-Capillary: 350 mg/dL — ABNORMAL HIGH (ref 70–99)

## 2019-06-11 MED ORDER — INSULIN ASPART 100 UNIT/ML ~~LOC~~ SOLN
4.0000 [IU] | Freq: Three times a day (TID) | SUBCUTANEOUS | Status: DC
  Administered 2019-06-12 – 2019-06-15 (×11): 4 [IU] via SUBCUTANEOUS

## 2019-06-11 MED ORDER — HYDROMORPHONE HCL 1 MG/ML IJ SOLN
1.0000 mg | Freq: Two times a day (BID) | INTRAMUSCULAR | Status: DC | PRN
  Administered 2019-06-11 – 2019-06-15 (×8): 1 mg via INTRAVENOUS
  Filled 2019-06-11 (×8): qty 1

## 2019-06-11 MED ORDER — INSULIN NPH (HUMAN) (ISOPHANE) 100 UNIT/ML ~~LOC~~ SUSP
22.0000 [IU] | Freq: Two times a day (BID) | SUBCUTANEOUS | Status: DC
  Administered 2019-06-11 – 2019-06-15 (×8): 22 [IU] via SUBCUTANEOUS

## 2019-06-11 MED ORDER — VANCOMYCIN HCL IN DEXTROSE 750-5 MG/150ML-% IV SOLN
750.0000 mg | Freq: Three times a day (TID) | INTRAVENOUS | Status: DC
  Administered 2019-06-11 – 2019-06-13 (×7): 750 mg via INTRAVENOUS
  Filled 2019-06-11 (×9): qty 150

## 2019-06-11 MED ORDER — OXYCODONE HCL 5 MG PO TABS
10.0000 mg | ORAL_TABLET | ORAL | Status: DC | PRN
  Administered 2019-06-11: 5 mg via ORAL
  Administered 2019-06-11 – 2019-06-15 (×11): 10 mg via ORAL
  Filled 2019-06-11 (×12): qty 2

## 2019-06-11 NOTE — Evaluation (Signed)
Physical Therapy Evaluation Patient Details Name: Anthony Hubbard MRN: [id number] DOB: 1986-01-03 Today's Date: 06/11/2019   History of Present Illness  Patient is a 33 year old male with L LE abcess and cellulitis. he is S/P I & D. WBAT.  Clinical Impression  Patient received in bed, agrees to PT evaluation. Reports he has not been out of the bed at all. Required min assist to achieve supine to sit. Once sitting patient reports leg is feeling much better after surgery than before. Noted to have swelling at left ankle. Patient requires mod assist and bed elevated to perform sit to stand. Initially hesitant to put any weight on L LE, educated that patient is allowed to be WBAT. Patient continues to put very little weight on L LE. He is able to ambulate with RW and min assist 20 feet. Patient will benefit from continued skilled PT to improve ambulation, strength and functional independence to return home.       Follow Up Recommendations Home health PT    Equipment Recommendations  Rolling walker with 5" wheels;3in1 (PT)    Recommendations for Other Services       Precautions / Restrictions Precautions Precautions: Fall Precaution Comments: mod fall Restrictions Weight Bearing Restrictions: No      Mobility  Bed Mobility Overal bed mobility: Needs Assistance Bed Mobility: Supine to Sit     Supine to sit: Min assist     General bed mobility comments: requires min assist with bringing L LE off bed, min assist to raise trunk from bed to seated position.  Transfers Overall transfer level: Needs assistance Equipment used: Rolling walker (2 wheeled) Transfers: Sit to/from Stand Sit to Stand: Mod assist;From elevated surface         General transfer comment: needed two attempts to get to standing  Ambulation/Gait Ambulation/Gait assistance: Min guard Gait Distance (Feet): 20 Feet Assistive device: Rolling walker (2 wheeled) Gait Pattern/deviations: Step-to  pattern;Decreased stance time - left;Decreased step length - right;Decreased step length - left Gait velocity: decreased   General Gait Details: despite being WBAT, patient is putting very little weight on left LE, at times no weight at all.  Stairs            Wheelchair Mobility    Modified Rankin (Stroke Patients Only)       Balance Overall balance assessment: Needs assistance Sitting-balance support: Feet supported Sitting balance-Leahy Scale: Normal     Standing balance support: Bilateral upper extremity supported Standing balance-Leahy Scale: Fair                               Pertinent Vitals/Pain Pain Assessment: 0-10 Pain Score: 3  Pain Descriptors / Indicators: Aching;Burning;Shooting;Discomfort Pain Intervention(s): Limited activity within patient's tolerance;Monitored during session;Repositioned;Ice applied    Home Living Family/patient expects to be discharged to:: Private residence Living Arrangements: Non-relatives/Friends Available Help at Discharge: Friend(s) Type of Home: House Home Access: Level entry     Home Layout: One level Home Equipment: None      Prior Function Level of Independence: Independent         Comments: patient has been having this issue for the last month and had not really been able to walk.     Hand Dominance        Extremity/Trunk Assessment   Upper Extremity Assessment Upper Extremity Assessment: Overall WFL for tasks assessed    Lower Extremity Assessment Lower Extremity Assessment: LLE deficits/detail LLE  Deficits / Details: painful LLE: Unable to fully assess due to pain LLE Sensation: decreased light touch LLE Coordination: decreased fine motor;decreased gross motor    Cervical / Trunk Assessment Cervical / Trunk Assessment: Normal  Communication   Communication: No difficulties  Cognition Arousal/Alertness: Awake/alert Behavior During Therapy: WFL for tasks  assessed/performed Overall Cognitive Status: Within Functional Limits for tasks assessed                                        General Comments      Exercises Total Joint Exercises Ankle Circles/Pumps: AROM;Both;10 reps   Assessment/Plan    PT Assessment Patient needs continued PT services  PT Problem List Decreased strength;Decreased mobility;Decreased activity tolerance;Decreased balance;Pain;Decreased knowledge of precautions       PT Treatment Interventions DME instruction;Therapeutic activities;Gait training;Therapeutic exercise;Balance training;Functional mobility training;Patient/family education    PT Goals (Current goals can be found in the Care Plan section)  Acute Rehab PT Goals Patient Stated Goal: to improve walking PT Goal Formulation: With patient Time For Goal Achievement: 06/18/19 Potential to Achieve Goals: Good    Frequency Min 3X/week   Barriers to discharge        Co-evaluation               AM-PAC PT "6 Clicks" Mobility  Outcome Measure Help needed turning from your back to your side while in a flat bed without using bedrails?: A Little Help needed moving from lying on your back to sitting on the side of a flat bed without using bedrails?: A Little Help needed moving to and from a bed to a chair (including a wheelchair)?: A Little Help needed standing up from a chair using your arms (e.g., wheelchair or bedside chair)?: A Lot Help needed to walk in hospital room?: A Little Help needed climbing 3-5 steps with a railing? : A Lot 6 Click Score: 16    End of Session Equipment Utilized During Treatment: Gait belt Activity Tolerance: Patient tolerated treatment well Patient left: in chair;with call bell/phone within reach Nurse Communication: Mobility status PT Visit Diagnosis: Unsteadiness on feet (R26.81);Muscle weakness (generalized) (M62.81);Difficulty in walking, not elsewhere classified (R26.2);Pain Pain - Right/Left:  Left Pain - part of body: Leg    Time: 1300-1330 PT Time Calculation (min) (ACUTE ONLY): 30 min   Charges:   PT Evaluation $PT Eval Low Complexity: 1 Low PT Treatments $Gait Training: 8-22 mins        Cherisse Carrell, PT, GCS 06/11/19,1:50 PM

## 2019-06-11 NOTE — TOC Progression Note (Signed)
Transition of Care [facility]) - Progression Note    Patient Details  Name: Anthony Hubbard MRN: [id number] Date of Birth: 1986/03/29  [facility]) CM/SW Contact  Jacalyn Lefevre Edson Snowball, RN Phone Number: 06/11/2019, 3:16 PM  Clinical Narrative:     Confirmed face sheet information , patient from home with girl friend. Referral for charity home health given to Joen Laura with [facility].   Referral for walker and 3 in 1 given to Zack with [facility]. Both will ask patient information to see if he qualifies for charity home health and DME.   Patient aware his girlfriend and him will be taught wound care.   Follow up appointment scheduled at [facility] for July 29. Please send discharge prescriptions to [place] [facility] will [facility] patient ( medication assistance).   Expected Discharge Plan: [place] Barriers to Discharge: Continued Medical Work up  Expected Discharge Plan and Services Expected Discharge Plan: [place] In-house Referral: Financial Counselor Discharge Planning Services: CM Consult, [facility], [place] [facility], Medication Assistance Post Acute Care Choice: Durable Medical Equipment, Home Health Living arrangements for the past 2 months: Single Family Home                 DME Arranged: 3-N-1, Walker rolling DME Agency: [employer] Date DME Agency Contacted: 06/11/19 Time DME Agency Contacted: [phone] Representative spoke with at DME Agency: [place]: PT, RN [place] Agency: [facility] Date [place]: 06/11/19 Time [place]: 1515 Representative spoke with at [place]: [place] ([place]) Interventions    Readmission Risk Interventions No flowsheet data found.

## 2019-06-11 NOTE — Social Work (Signed)
CSW acknowledging consult for SNF placement. Plan is for pt to d/c home with his significant other, they will be taught dressing changes by RN staff. RNCM following for [facility] program assistance with medications.   Westley Hummer, MSW, [place] Work [phone]

## 2019-06-11 NOTE — Progress Notes (Signed)
Anthony Hubbard Kitchen.  PROGRESS NOTE    Anthony MusaCarlos Hubbard  WJX:[id number] DOB: 25-Feb-1986 DOA: 06/07/2019 PCP: Maudie FlakesAnderson, Anthony D, FNP   Brief Narrative:   Anthony PheasantCarlos Gomez-Bellois a 33 y.o.Mwith hx IDDM, HTN not currently treatedwho presents with worsening left leg swelling, redness and warmth, failing Augmentin.  The patient was in his usual state of health until mid June, he developed a spontaneous red painful swelling on the front of his left shin. This persisted, gradually getting worse until about 1 week ago, when he developed a much larger red painful swelling just medial to it. He went to urgent care, where he was started on Augmentin. He then came here to the emergency room 7/11, had an MR of the left lower extremity as well as ultrasound of the left lower extremity that showed a superficial venous thrombosis by (US)andmild cellulitis and myositis of the left calf without abscess.  He was benign in appearance and was discharged to continue with Augmentin and follow up with orthopedics, which he was not able to do due to having no insurance.  Now in the last three days, despite taking Augmentin BID as prescribed for last 6 days, he has worsening severe left calf pain, worse with plantarflexion, associated with warmth and swelling. He has had no firmness of the calf as a whole, and no numbness or loss of function of toes. Intermittent fever, no respiratory symptoms.  In the ER, he was tachycardic to 120s, BP 180s systolic, WBC 14K. Lactate normal. CK normal. He was given IVF, vancomycin and Zosyn. His case was discussed with orthopedics, who will evaluate patient in AM.   Assessment & Plan:   Principal Problem:   Cellulitis and abscess of left leg Active Problems:   Sepsis (HCC)   Type 2 diabetes mellitus with hyperglycemia, with long-term current use of insulin (HCC)   Essential hypertension   Hypertensive urgency   Sepsis from left leg cellulitis     - No clinical suspicion for  compartment syndrome due to benign appearing calf, neurovascularly intact, slow clinical course.     - Continue IV fluids     -Currently on vancomycin, ceftriaxone and Flagyl     -Repeat MRI shows lower extremity abscess.     -Seen by orthopedics and underwent irrigation debridement of left lower leg abscess.  Initial intraoperative cultures show gram-positive cocci. -Case reviewed with Dr. Luciana Axeomer, infectious disease who recommended to continue vancomycin for now, but discontinue ceftriaxone and Flagyl.  Follow-up culture results.  Will likely transition home on doxycycline/Keflex  Diabetes with hyperglycemia     - Hyperglycemic on admission, AG normal.       - Not taking metformin, Ozempic or Tresiba since being laid off because he can't afford them.     - now on NPH 22 units BID in anticipation of d/c with affordable insulin (likely [facility] best [facility]; DM coordinator to see)     - SSI correction insulin     - A1c 11.9 -Currently blood sugars are stable  Hypertension/Hypertensive urgency     - BP 181/130 at admission.     - Improving in the ER just with pain control.     - No evidence of end organ damage.     - home HCTZ and losartan     - SW consult for meds access at d/c given no insurance (if unable to get these meds, [facility] has free lisinopril/norvasc)     - increase losartan to 100mg  daily, watch potassium  Superficial venous thrombosis     - Analgesics as needed     - May have Kpad or other hot compress PRN   DVT prophylaxis: Lovenox Code Status: FULL   Disposition Plan: TBD   Consultants:   Orthopedics   Antimicrobials:  Clement Husbands. Vanc, rocephin, flagyl    Subjective: Continues to have significant pain in left lower leg.  Pain is substantially worse during dressing changes.  Objective: Vitals:   06/10/19 2034 06/11/19 0114 06/11/19 0412 06/11/19 1418  BP: (!) 152/97 138/86 (!) 117/98 114/63  Pulse: 91 100 79 62  Resp: 16 18 16 18   Temp: 98.1 F  (36.7 C) 97.9 F (36.6 C) 98.6 F (37 C) 98.4 F (36.9 C)  TempSrc: Oral Oral Oral Oral  SpO2: 98% 96% 97% 99%  Weight:      Height:        Intake/Output Summary (Last 24 hours) at 06/11/2019 2000 Last data filed at 06/11/2019 1700 Gross per 24 hour  Intake 1472.83 ml  Output 1250 ml  Net 222.83 ml   Filed Weights   06/07/19 2332 06/10/19 1425  Weight: 105.9 kg 105.9 kg    Examination:  General exam: Alert, awake, oriented x 3 Respiratory system: Clear to auscultation. Respiratory effort normal. Cardiovascular system:RRR. No murmurs, rubs, gallops. Gastrointestinal system: Abdomen is nondistended, soft and nontender. No organomegaly or masses felt. Normal bowel sounds heard. Central nervous system: Alert and oriented. No focal neurological deficits. Extremities: Left lower extremity is in dressings. Skin: No rashes, lesions or ulcers Psychiatry: Judgement and insight appear normal. Mood & affect appropriate.    Data Reviewed: I have personally reviewed following labs and imaging studies.  CBC: Recent Labs  Lab 06/07/19 1825 06/08/19 0319 06/09/19 0341  WBC 14.6* 13.8* 11.2*  NEUTROABS 10.4*  --  6.8  HGB 18.5* 15.6 15.5  HCT 56.7* 48.8 46.9  MCV 85.6 87.6 86.1  PLT 369 239 288   Basic Metabolic Panel: Recent Labs  Lab 06/07/19 1825 06/08/19 0319 06/09/19 0341 06/10/19 2357  NA 135 134* 135 131*  K 4.6 5.4* 3.7 4.2  CL 95* 101 99 95*  CO2 25 23 22  21*  GLUCOSE 355* 315* 273* 419*  BUN 21* 20 15 19   CREATININE 1.16 1.04 0.84 1.22  CALCIUM 10.2 9.1 9.3 9.3  MG  --   --  1.6* 1.5*  PHOS  --   --  4.1  --    GFR: Estimated Creatinine Clearance: 104.4 mL/min (by C-G formula based on SCr of 1.22 mg/dL). Liver Function Tests: Recent Labs  Lab 06/07/19 1825 06/09/19 0341 06/10/19 2357  AST 31  --  32  ALT 44  --  42  ALKPHOS 121  --  87  BILITOT 0.7  --  0.9  PROT 9.9*  --  7.3  ALBUMIN 4.6 3.3* 3.3*   No results for input(s): LIPASE, AMYLASE in  the last 168 hours. No results for input(s): AMMONIA in the last 168 hours. Coagulation Profile: No results for input(s): INR, PROTIME in the last 168 hours. Cardiac Enzymes: Recent Labs  Lab 06/07/19 2019  CKTOTAL 74   BNP (last 3 results) No results for input(s): PROBNP in the last 8760 hours. HbA1C: No results for input(s): HGBA1C in the last 72 hours. CBG: Recent Labs  Lab 06/10/19 1814 06/10/19 2149 06/11/19 0806 06/11/19 1224 06/11/19 1655  GLUCAP 147* 265* 342* 262* 191*   Lipid Profile: No results for input(s): CHOL, HDL, LDLCALC, TRIG, CHOLHDL, LDLDIRECT in  the last 72 hours. Thyroid Function Tests: No results for input(s): TSH, T4TOTAL, FREET4, T3FREE, THYROIDAB in the last 72 hours. Anemia Panel: No results for input(s): VITAMINB12, FOLATE, FERRITIN, TIBC, IRON, RETICCTPCT in the last 72 hours. Sepsis Labs: Recent Labs  Lab 06/07/19 1825  LATICACIDVEN 1.5    Recent Results (from the past 240 hour(s))  Culture, blood (routine x 2)     Status: None (Preliminary result)   Collection Time: 06/07/19  7:26 PM   Specimen: BLOOD RIGHT FOREARM  Result Value Ref Range Status   Specimen Description   Final    BLOOD RIGHT FOREARM Performed at [facility], [address] [place], [place] [address]    Special Requests   Final    BOTTLES DRAWN AEROBIC AND ANAEROBIC Blood Culture adequate volume Performed at [facility], [address] [place], [place] [address]    Culture   Final    NO GROWTH 3 DAYS Performed at [facility], [address] [place], [place] [address]    Report Status PENDING  Incomplete  Culture, blood (routine x 2)     Status: None (Preliminary result)   Collection Time: 06/07/19  7:26 PM   Specimen: BLOOD  Result Value Ref Range Status   Specimen Description   Final    BLOOD LEFT ANTECUBITAL Performed at [facility], [address] [place], [place] [address]    Special Requests    Final    BOTTLES DRAWN AEROBIC AND ANAEROBIC Blood Culture adequate volume Performed at [facility], [address] [place], [place] [address]    Culture   Final    NO GROWTH 3 DAYS Performed at [facility], [address] [place], [place] [address]    Report Status PENDING  Incomplete  SARS Coronavirus 2 (CEPHEID - Performed in [facility] hospital lab), Hosp Order     Status: None   Collection Time: 06/07/19  9:00 PM   Specimen: Nasopharyngeal Swab  Result Value Ref Range Status   SARS Coronavirus 2 NEGATIVE NEGATIVE Final    Comment: (NOTE) If result is NEGATIVE SARS-CoV-2 target nucleic acids are NOT DETECTED. The SARS-CoV-2 RNA is generally detectable in upper and lower  respiratory specimens during the acute phase of infection. The lowest  concentration of SARS-CoV-2 viral copies this assay can detect is 250  copies / mL. A negative result does not preclude SARS-CoV-2 infection  and should not be used as the sole basis for treatment or other  patient management decisions.  A negative result may occur with  improper specimen collection / handling, submission of specimen other  than nasopharyngeal swab, presence of viral mutation(s) within the  areas targeted by this assay, and inadequate number of viral copies  (<250 copies / mL). A negative result must be combined with clinical  observations, patient history, and epidemiological information. If result is POSITIVE SARS-CoV-2 target nucleic acids are DETECTED. The SARS-CoV-2 RNA is generally detectable in upper and lower  respiratory specimens dur ing the acute phase of infection.  Positive  results are indicative of active infection with SARS-CoV-2.  Clinical  correlation with patient history and other diagnostic information is  necessary to determine patient infection status.  Positive results do  not rule out bacterial infection or co-infection with other viruses. If result is PRESUMPTIVE  POSTIVE SARS-CoV-2 nucleic acids MAY BE PRESENT.   A presumptive positive result was obtained on the submitted specimen  and  confirmed on repeat testing.  While 2019 novel coronavirus  (SARS-CoV-2) nucleic acids may be present in the submitted sample  additional confirmatory testing may be necessary for epidemiological  and / or clinical management purposes  to differentiate between  SARS-CoV-2 and other Sarbecovirus currently known to infect humans.  If clinically indicated additional testing with an alternate test  methodology [phone]) is advised. The SARS-CoV-2 RNA is generally  detectable in upper and lower respiratory sp ecimens during the acute  phase of infection. The expected result is Negative. Fact Sheet for Patients:  StrictlyIdeas.no Fact Sheet for Healthcare Providers: BankingDealers.co.za This test is not yet approved or cleared by the [place] FDA and has been authorized for detection and/or diagnosis of SARS-CoV-2 by FDA under an Emergency Use Authorization (EUA).  This EUA will remain in effect (meaning this test can be used) for the duration of the COVID-19 declaration under Section 564(b)(1) of the Act, 21 U.S.C. section 360bbb-3(b)(1), unless the authorization is terminated or revoked sooner. Performed at [facility], [place] [address] [place], [place] [address]   Surgical pcr screen     Status: None   Collection Time: 06/10/19  1:04 AM   Specimen: Nasal Mucosa; Nasal Swab  Result Value Ref Range Status   MRSA, PCR NEGATIVE NEGATIVE Final   Staphylococcus aureus NEGATIVE NEGATIVE Final    Comment: (NOTE) The Xpert SA Assay (FDA approved for NASAL specimens in patients 8 years of age and older), is one component of a comprehensive surveillance program. It is not intended to diagnose infection nor to guide or monitor treatment. Performed at [facility], [place] [address],  [place], [place] [address]   Aerobic/Anaerobic Culture (surgical/deep wound)     Status: None (Preliminary result)   Collection Time: 06/10/19  5:50 PM   Specimen: Soft Tissue, Other  Result Value Ref Range Status   Specimen Description ABSCESS LEFT LEG  Final   Special Requests SWAB  Final   Gram Stain   Final    ABUNDANT WBC PRESENT, PREDOMINANTLY PMN MODERATE GRAM POSITIVE COCCI    Culture   Final    CULTURE REINCUBATED FOR BETTER GROWTH Performed at [facility], [place] [address], [place], [place] [address]    Report Status PENDING  Incomplete     Radiology Studies: No results found.   Scheduled Meds: . aspirin  81 mg Oral BID  . docusate sodium  100 mg Oral BID  . hydrochlorothiazide  12.5 mg Oral Daily  . insulin aspart  0-15 Units Subcutaneous TID WC  . insulin aspart  0-5 Units Subcutaneous QHS  . [START ON 06/12/2019] insulin aspart  4 Units Subcutaneous TID WC  . insulin NPH Human  22 Units Subcutaneous BID AC & HS  . losartan  100 mg Oral Daily  . magnesium oxide  400 mg Oral BID   Continuous Infusions: . sodium chloride Stopped (06/09/19 1742)  . sodium chloride 125 mL/hr at 06/10/19 2126  . methocarbamol (ROBAXIN) IV    . vancomycin 750 mg (06/11/19 1510)     LOS: 4 days    Time spent: 25 minutes spent in the coordination of care today.    Kathie Dike, MD [facility]  If 7PM-7AM, please contact night-coverage [email or web address]  06/11/2019, 8:00 PM

## 2019-06-11 NOTE — Progress Notes (Signed)
Pharmacy Antibiotic Note  Anthony Hubbard is a 33 y.o. male admitted on 06/07/2019 with LLE cellulitis, myositis, left leg abscess. Pharmacy has been consulted for Vancomycin dosing. Patient also placed on Ceftriaxone and Metronidazole per MD.   Vancomycin levels drawn at Steady state. VP 39, VT 15. Calculated AUC 586 (goal 400-550). Will adjust.  Plan: Adjust Vancomycin to  750 mg IV q8h, expected AUC 439 Plan for Vancomycin levels  as indicated Ceftriaxone 2g IV q24h per MD Metronidazole 500mg  IV q8h per MD Monitor renal function, cultures, clinical course  Height: 5' 7.99" (172.7 cm) Weight: 233 lb 7.5 oz (105.9 kg) IBW/kg (Calculated) : 68.38  Temp (24hrs), Avg:97.9 F (36.6 C), Min:97.2 F (36.2 C), Max:98.6 F (37 C)  Recent Labs  Lab 06/07/19 1825 06/08/19 0319 06/09/19 0341 06/10/19 2357 06/11/19 0737 06/11/19 1259  WBC 14.6* 13.8* 11.2*  --   --   --   CREATININE 1.16 1.04 0.84 1.22  --   --   LATICACIDVEN 1.5  --   --   --   --   --   VANCOTROUGH  --   --   --   --   --  15  VANCOPEAK  --   --   --  54* 39  --     Estimated Creatinine Clearance: 104.4 mL/min (by C-G formula based on SCr of 1.22 mg/dL).    No Known Allergies  Antimicrobials this admission: 7/12 zosyn x 1  7/12 vancomycin >>  7/13 ceftriaxone >> 7/13 metronidazole >>  Microbiology results: 7/15 Abscess cxs: GPC 7/12 BCx: pending  7/12 COVID: negative    Wynee Matarazzo A. Levada Dy, PharmD, [place] Please utilize Amion for appropriate phone number to reach the unit pharmacist ([place])

## 2019-06-11 NOTE — Progress Notes (Signed)
Subjective: 1 Day Post-Op Procedure(s) (LRB): IRRIGATION AND DEBRIDEMENT LEFT LOWER EXTREMITY (Left) Patient reports pain as mild.  Pain to LLE much better  Objective: Vital signs in last 24 hours: Temp:  [97.2 F (36.2 C)-98.6 F (37 C)] 98.6 F (37 C) (07/16 0412) Pulse Rate:  [79-116] 79 (07/16 0412) Resp:  [16-32] 16 (07/16 0412) BP: (117-161)/(86-116) 117/98 (07/16 0412) SpO2:  [94 %-99 %] 97 % (07/16 0412) Weight:  [105.9 kg] 105.9 kg (07/15 1425)  Intake/Output from previous day: 07/15 0701 - 07/16 0700 In: 822.8 [P.O.:480; I.V.:342.8] Out: 1255 [Urine:1250; Blood:5] Intake/Output this shift: No intake/output data recorded.  Recent Labs    06/09/19 0341  HGB 15.5   Recent Labs    06/09/19 0341  WBC 11.2*  RBC 5.45  HCT 46.9  PLT 288   Recent Labs    06/09/19 0341 06/10/19 2357  NA 135 131*  K 3.7 4.2  CL 99 95*  CO2 22 21*  BUN 15 19  CREATININE 0.84 1.22  GLUCOSE 273* 419*  CALCIUM 9.3 9.3   No results for input(s): LABPT, INR in the last 72 hours.  Neurologically intact Neurovascular intact Sensation intact distally Intact pulses distally Dorsiflexion/Plantar flexion intact Incision: dressing C/D/I No cellulitis present Compartment soft   Assessment/Plan: 1 Day Post-Op Procedure(s) (LRB): IRRIGATION AND DEBRIDEMENT LEFT LOWER EXTREMITY (Left) Up with therapy  WBAT LLE Wet-to-dry dressing changes with packing bid by wound care team Intra-op cx growing gram positive cocci Currently on vancomycin+rocephin+flagyl Dr. Erlinda Hong recommending ID consult F/u with Dr. Erlinda Hong 2 weeks post-op for wound check      Anthony Hubbard 06/11/2019, 7:50 AM

## 2019-06-11 NOTE — Progress Notes (Signed)
Pharmacist aware of Pt's Vanc peak level of 54.

## 2019-06-11 NOTE — Consult Note (Signed)
[place] Nurse wound consult note Patient receiving care in [facility] 6N12. Reason for Consult: wet to dry packing of left leg abscess BID Wound type: Surgical I spoke with Dr. Erlinda Hong via telephone and conferred with him and explained that wet-to-dry saline dressing changes are within the scope of practice of every licensed nurse.  He is in agreement that this wound care order is to be transferred to the bedside RN. Monitor the wound area(s) for worsening of condition such as: Signs/symptoms of infection,  Increase in size,  Development of or worsening of odor, Development of pain, or increased pain at the affected locations.  Notify the medical team if any of these develop.  Thank you for the consult.  Discussed plan of care with the attending surgeon, Dr. Erlinda Hong.  [facility] nurse will not follow at this time.  Please re-consult the [place] team if needed.  Val Riles, RN, MSN, CWOCN, CNS-BC, pager [phone]

## 2019-06-11 NOTE — Progress Notes (Signed)
Dressing change dne per order but patient is not tolerating dressing change. PRN meds given, MD paged for more additional pain med during change but patient is not tolerating it. Dressing change took me on hour but still packing is not done right because patient said he will pass out and he just cant do it, wet small kerlix applied to pack but unable to go deeper inside the wound because of pain. Will endorse appropriately.

## 2019-06-11 NOTE — Plan of Care (Signed)
°  Problem: Nutrition: °Goal: Adequate nutrition will be maintained °Outcome: Progressing °  °Problem: Coping: °Goal: Level of anxiety will decrease °Outcome: Progressing °  °Problem: Safety: °Goal: Ability to remain free from injury will improve °Outcome: Progressing °  °

## 2019-06-11 NOTE — Progress Notes (Signed)
Inpatient Diabetes Program Recommendations  AACE/ADA: New Consensus Statement on Inpatient Glycemic Control (2015)  Target Ranges:  Prepandial:   less than 140 mg/dL      Peak postprandial:   less than 180 mg/dL (1-2 hours)      Critically ill patients:  140 - 180 mg/dL   Lab Results  Component Value Date   GLUCAP 191 (H) 06/11/2019   HGBA1C 11.9 (H) 06/08/2019    Review of Glycemic Control  CBGs today - 342, 262, 191 mg/dL  PO intake < 50%.   Inpatient Diabetes Program Recommendations:     Increase NPH to 22 units bid Add Novolog 4 units tidwc if pt eats > 50 % meal.  Will continue to follow.  Thank you. Lorenda Peck, RD, LDN, CDE Inpatient Diabetes Coordinator [phone]

## 2019-06-12 LAB — COMPREHENSIVE METABOLIC PANEL
ALT: 34 U/L (ref 0–44)
AST: 27 U/L (ref 15–41)
Albumin: 3 g/dL — ABNORMAL LOW (ref 3.5–5.0)
Alkaline Phosphatase: 65 U/L (ref 38–126)
Anion gap: 11 (ref 5–15)
BUN: 25 mg/dL — ABNORMAL HIGH (ref 6–20)
CO2: 24 mmol/L (ref 22–32)
Calcium: 9.2 mg/dL (ref 8.9–10.3)
Chloride: 100 mmol/L (ref 98–111)
Creatinine, Ser: 0.97 mg/dL (ref 0.61–1.24)
GFR calc Af Amer: >60 mL/min (ref >60)
GFR calc non Af Amer: >60 mL/min (ref >60)
Glucose, Bld: 224 mg/dL — ABNORMAL HIGH (ref 70–99)
Potassium: 3.7 mmol/L (ref 3.5–5.1)
Sodium: 135 mmol/L (ref 135–145)
Total Bilirubin: 0.6 mg/dL (ref 0.3–1.2)
Total Protein: 6.2 g/dL — ABNORMAL LOW (ref 6.5–8.1)

## 2019-06-12 LAB — CBC
HCT: 43.9 % (ref 39.0–52.0)
Hemoglobin: 14.6 g/dL (ref 13.0–17.0)
MCH: 28.3 pg (ref 26.0–34.0)
MCHC: 33.3 g/dL (ref 30.0–36.0)
MCV: 85.1 fL (ref 80.0–100.0)
Platelets: 342 10*3/uL (ref 150–400)
RBC: 5.16 MIL/uL (ref 4.22–5.81)
RDW: 12 % (ref 11.5–15.5)
WBC: 10.7 10*3/uL — ABNORMAL HIGH (ref 4.0–10.5)
nRBC: 0 % (ref 0.0–0.2)

## 2019-06-12 LAB — GLUCOSE, CAPILLARY
Glucose-Capillary: 189 mg/dL — ABNORMAL HIGH (ref 70–99)
Glucose-Capillary: 215 mg/dL — ABNORMAL HIGH (ref 70–99)
Glucose-Capillary: 179 mg/dL — ABNORMAL HIGH (ref 70–99)
Glucose-Capillary: 154 mg/dL — ABNORMAL HIGH (ref 70–99)

## 2019-06-12 LAB — MAGNESIUM: Magnesium: 1.7 mg/dL (ref 1.7–2.4)

## 2019-06-12 MED ORDER — SODIUM CHLORIDE 0.9% FLUSH
10.0000 mL | INTRAVENOUS | Status: DC | PRN
  Administered 2019-06-12: 20:00:00 10 mL
  Filled 2019-06-12: qty 40

## 2019-06-12 MED ORDER — SODIUM CHLORIDE 0.9% FLUSH
10.0000 mL | Freq: Two times a day (BID) | INTRAVENOUS | Status: DC
  Administered 2019-06-12 – 2019-06-15 (×3): 10 mL

## 2019-06-12 NOTE — Plan of Care (Signed)
  Problem: Clinical Measurements: Goal: Ability to maintain clinical measurements within normal limits will improve Outcome: Progressing   Problem: Activity: Goal: Risk for activity intolerance will decrease Outcome: Progressing   Problem: Pain Managment: Goal: General experience of comfort will improve Outcome: Progressing   

## 2019-06-12 NOTE — Progress Notes (Signed)
Anthony Hubbard Kitchen  PROGRESS NOTE    Anthony Hubbard  UEA:[id number] DOB: Jun 04, 1986 DOA: 06/07/2019 PCP: Gregor Hams, FNP   Brief Narrative:   Anthony Hubbard a 33 y.o.Mwith hx IDDM, HTN not currently treatedwho presents with worsening left leg swelling, redness and warmth, failing Augmentin.  The patient was in his usual state of health until mid June, he developed a spontaneous red painful swelling on the front of his left shin. This persisted, gradually getting worse until about 1 week ago, when he developed a much larger red painful swelling just medial to it. He went to urgent care, where he was started on Augmentin. He then came here to the emergency room 7/11, had an MR of the left lower extremity as well as ultrasound of the left lower extremity that showed a superficial venous thrombosis by (US)andmild cellulitis and myositis of the left calf without abscess.  He was benign in appearance and was discharged to continue with Augmentin and follow up with orthopedics, which he was not able to do due to having no insurance.  Now in the last three days, despite taking Augmentin BID as prescribed for last 6 days, he has worsening severe left calf pain, worse with plantarflexion, associated with warmth and swelling. He has had no firmness of the calf as a whole, and no numbness or loss of function of toes. Intermittent fever, no respiratory symptoms.  In the ER, he was tachycardic to 478G, BP 956O systolic, WBC 13Y. Lactate normal. CK normal. He was given IVF, vancomycin and Zosyn. His case was discussed with orthopedics, who will evaluate patient in AM.   Assessment & Plan:   Principal Problem:   Cellulitis and abscess of left leg Active Problems:   Sepsis ([place])   Type 2 diabetes mellitus with hyperglycemia, with long-term current use of insulin (HCC)   Essential hypertension   Hypertensive urgency   Sepsis from left leg cellulitis     - No clinical suspicion for  compartment syndrome due to benign appearing calf, neurovascularly intact, slow clinical course.     - Continue IV fluids     -Currently on vancomycin, ceftriaxone and Flagyl     -Repeat MRI shows lower extremity abscess.     -Seen by orthopedics and underwent irrigation debridement of left lower leg abscess.  Initial intraoperative cultures show gram-positive cocci. -Case reviewed with Dr. Linus Salmons, infectious disease who recommended to continue vancomycin for now, but discontinue ceftriaxone and Flagyl.  Follow-up culture results.  Will likely transition home on doxycycline/Keflex -Patient has concerns about discharging home since he feels he will not able to do twice daily dressing changes.  He feels pain is uncontrolled and causes him the past.  He feels that he cannot perform the dressing changes himself and his significant other is not frequently around.  Will reach out to [facility] team to see if he is a candidate for skilled nursing facility placement for wound care.  Diabetes with hyperglycemia     - Hyperglycemic on admission, AG normal.       - Not taking metformin, Ozempic or Tresiba since being laid off because he can't afford them.     - now on NPH 22 units BID in anticipation of d/c with affordable insulin (likely ReliOn insulin at [facility] best option; DM coordinator to see)     - SSI correction insulin     - A1c 11.9 -Currently blood sugars are stable  Hypertension/Hypertensive urgency     - BP 181/130 at admission.     -  Improving in the ER just with pain control.     - No evidence of end organ damage.     - home HCTZ and losartan     - SW consult for meds access at d/c given no insurance (if unable to get these meds, [facility] has free lisinopril/norvasc)     - increase losartan to 100mg  daily, watch potassium  Superficial venous thrombosis     - Analgesics as needed     - May have Kpad or other hot compress PRN   DVT prophylaxis: Lovenox Code Status: FULL   Disposition Plan:  TBD   Consultants:   Orthopedics   Antimicrobials:  Anthony Hubbard Kitchen. Vanc   Subjective: Still has significant pain in his wound.  Has a lot of difficulty with dressing changes and feels that he will not be able to do this on his own at home.  He reports that his significant other is not always at home.  He is concerned about going home with home health care.  Objective: Vitals:   06/11/19 1418 06/11/19 2047 06/12/19 0531 06/12/19 1430  BP: 114/63 (!) 130/94 126/85 128/87  Pulse: 62 80 80 82  Resp: 18 18 18 20   Temp: 98.4 F (36.9 C) 97.8 F (36.6 C) 98.5 F (36.9 C) 98.7 F (37.1 C)  TempSrc: Oral Oral Oral Oral  SpO2: 99% 98% 100% 99%  Weight:      Height:        Intake/Output Summary (Last 24 hours) at 06/12/2019 1904 Last data filed at 06/12/2019 1000 Gross per 24 hour  Intake 1190 ml  Output 1400 ml  Net -210 ml   Filed Weights   06/07/19 2332 06/10/19 1425  Weight: 105.9 kg 105.9 kg    Examination:  General exam: Alert, awake, oriented x 3 Respiratory system: Clear to auscultation. Respiratory effort normal. Cardiovascular system:RRR. No murmurs, rubs, gallops. Gastrointestinal system: Abdomen is nondistended, soft and nontender. No organomegaly or masses felt. Normal bowel sounds heard. Central nervous system: Alert and oriented. No focal neurological deficits. Extremities: Left lower extremity is wrapped in dressings. Skin: No rashes, lesions or ulcers Psychiatry: Judgement and insight appear normal. Mood & affect appropriate.   Data Reviewed: I have personally reviewed following labs and imaging studies.  CBC: Recent Labs  Lab 06/07/19 1825 06/08/19 0319 06/09/19 0341 06/12/19 0132  WBC 14.6* 13.8* 11.2* 10.7*  NEUTROABS 10.4*  --  6.8  --   HGB 18.5* 15.6 15.5 14.6  HCT 56.7* 48.8 46.9 43.9  MCV 85.6 87.6 86.1 85.1  PLT 369 239 288 342   Basic Metabolic Panel: Recent Labs  Lab 06/07/19 1825 06/08/19 0319 06/09/19 0341 06/10/19 2357 06/12/19 0132   NA 135 134* 135 131* 135  K 4.6 5.4* 3.7 4.2 3.7  CL 95* 101 99 95* 100  CO2 25 23 22  21* 24  GLUCOSE 355* 315* 273* 419* 224*  BUN 21* 20 15 19  25*  CREATININE 1.16 1.04 0.84 1.22 0.97  CALCIUM 10.2 9.1 9.3 9.3 9.2  MG  --   --  1.6* 1.5* 1.7  PHOS  --   --  4.1  --   --    GFR: Estimated Creatinine Clearance: 131.4 mL/min (by C-G formula based on SCr of 0.97 mg/dL). Liver Function Tests: Recent Labs  Lab 06/07/19 1825 06/09/19 0341 06/10/19 2357 06/12/19 0132  AST 31  --  32 27  ALT 44  --  42 34  ALKPHOS 121  --  87 65  BILITOT 0.7  --  0.9 0.6  PROT 9.9*  --  7.3 6.2*  ALBUMIN 4.6 3.3* 3.3* 3.0*   No results for input(s): LIPASE, AMYLASE in the last 168 hours. No results for input(s): AMMONIA in the last 168 hours. Coagulation Profile: No results for input(s): INR, PROTIME in the last 168 hours. Cardiac Enzymes: Recent Labs  Lab 06/07/19 2019  CKTOTAL 74   BNP (last 3 results) No results for input(s): PROBNP in the last 8760 hours. HbA1C: No results for input(s): HGBA1C in the last 72 hours. CBG: Recent Labs  Lab 06/11/19 1655 06/11/19 2105 06/12/19 0742 06/12/19 1120 06/12/19 1700  GLUCAP 191* 350* 189* 215* 179*   Lipid Profile: No results for input(s): CHOL, HDL, LDLCALC, TRIG, CHOLHDL, LDLDIRECT in the last 72 hours. Thyroid Function Tests: No results for input(s): TSH, T4TOTAL, FREET4, T3FREE, THYROIDAB in the last 72 hours. Anemia Panel: No results for input(s): VITAMINB12, FOLATE, FERRITIN, TIBC, IRON, RETICCTPCT in the last 72 hours. Sepsis Labs: Recent Labs  Lab 06/07/19 1825  LATICACIDVEN 1.5    Recent Results (from the past 240 hour(s))  Culture, blood (routine x 2)     Status: None (Preliminary result)   Collection Time: 06/07/19  7:26 PM   Specimen: BLOOD RIGHT FOREARM  Result Value Ref Range Status   Specimen Description   Final    BLOOD RIGHT FOREARM Performed at [facility], [address],  [place], [place] [address]    Special Requests   Final    BOTTLES DRAWN AEROBIC AND ANAEROBIC Blood Culture adequate volume Performed at [facility], [address], [place], [place] [address]    Culture   Final    NO GROWTH 4 DAYS Performed at [facility], [address] [place], [place] [address]    Report Status PENDING  Incomplete  Culture, blood (routine x 2)     Status: None (Preliminary result)   Collection Time: 06/07/19  7:26 PM   Specimen: BLOOD  Result Value Ref Range Status   Specimen Description   Final    BLOOD LEFT ANTECUBITAL Performed at [facility], [address], [place], [place] [address]    Special Requests   Final    BOTTLES DRAWN AEROBIC AND ANAEROBIC Blood Culture adequate volume Performed at [facility], [address] [place], [place] [address]    Culture   Final    NO GROWTH 4 DAYS Performed at [facility], [address] [place], [place] [address]    Report Status PENDING  Incomplete  SARS Coronavirus 2 (CEPHEID - Performed in [facility] hospital lab), Hosp Order     Status: None   Collection Time: 06/07/19  9:00 PM   Specimen: Nasopharyngeal Swab  Result Value Ref Range Status   SARS Coronavirus 2 NEGATIVE NEGATIVE Final    Comment: (NOTE) If result is NEGATIVE SARS-CoV-2 target nucleic acids are NOT DETECTED. The SARS-CoV-2 RNA is generally detectable in upper and lower  respiratory specimens during the acute phase of infection. The lowest  concentration of SARS-CoV-2 viral copies this assay can detect is 250  copies / mL. A negative result does not preclude SARS-CoV-2 infection  and should not be used as the sole basis for treatment or other  patient management decisions.  A negative result may occur with  improper specimen collection / handling, submission of specimen other  than nasopharyngeal swab, presence of viral mutation(s) within the  areas targeted by this assay, and  inadequate number of viral copies  (<250 copies / mL). A negative result must be combined with clinical  observations, patient history, and epidemiological information. If result is POSITIVE SARS-CoV-2 target nucleic acids are DETECTED. The SARS-CoV-2 RNA is generally detectable in upper and lower  respiratory specimens dur ing the acute phase of infection.  Positive  results are indicative of active infection with SARS-CoV-2.  Clinical  correlation with patient history and other diagnostic information is  necessary to determine patient infection status.  Positive results do  not rule out bacterial infection or co-infection with other viruses. If result is PRESUMPTIVE POSTIVE SARS-CoV-2 nucleic acids MAY BE PRESENT.   A presumptive positive result was obtained on the submitted specimen  and confirmed on repeat testing.  While 2019 novel coronavirus  (SARS-CoV-2) nucleic acids may be present in the submitted sample  additional confirmatory testing may be necessary for epidemiological  and / or clinical management purposes  to differentiate between  SARS-CoV-2 and other Sarbecovirus currently known to infect humans.  If clinically indicated additional testing with an alternate test  methodology [phone]([id number]) is advised. The SARS-CoV-2 RNA is generally  detectable in upper and lower respiratory sp ecimens during the acute  phase of infection. The expected result is Negative. Fact Sheet for Patients:  BoilerBrush.com.cyhttps://www.fda.gov/media/136312/download Fact Sheet for Healthcare Providers: https://pope.com/https://www.fda.gov/media/136313/download This test is not yet approved or cleared by the [place] FDA and has been authorized for detection and/or diagnosis of SARS-CoV-2 by FDA under an Emergency Use Authorization (EUA).  This EUA will remain in effect (meaning this test can be used) for the duration of the COVID-19 declaration under Section 564(b)(1) of the Act, 21 U.S.C. section 360bbb-3(b)(1), unless the  authorization is terminated or revoked sooner. Performed at [facility], [address] [place], [place] [address]   Surgical pcr screen     Status: None   Collection Time: 06/10/19  1:04 AM   Specimen: Nasal Mucosa; Nasal Swab  Result Value Ref Range Status   MRSA, PCR NEGATIVE NEGATIVE Final   Staphylococcus aureus NEGATIVE NEGATIVE Final    Comment: (NOTE) The Xpert SA Assay (FDA approved for NASAL specimens in patients 31 years of age and older), is one component of a comprehensive surveillance program. It is not intended to diagnose infection nor to guide or monitor treatment. Performed at [facility], [address] [place], [place] [address]   Aerobic/Anaerobic Culture (surgical/deep wound)     Status: None (Preliminary result)   Collection Time: 06/10/19  5:50 PM   Specimen: Soft Tissue, Other  Result Value Ref Range Status   Specimen Description ABSCESS LEFT LEG  Final   Special Requests SWAB  Final   Gram Stain   Final    ABUNDANT WBC PRESENT, PREDOMINANTLY PMN MODERATE GRAM POSITIVE COCCI Performed at [facility], [address] [place], [place] [address]    Culture   Final    MODERATE STAPHYLOCOCCUS AUREUS NO ANAEROBES ISOLATED; CULTURE IN PROGRESS FOR 5 DAYS    Report Status PENDING  Incomplete     Radiology Studies: No results found.   Scheduled Meds: . aspirin  81 mg Oral BID  . docusate sodium  100 mg Oral BID  . hydrochlorothiazide  12.5 mg Oral Daily  . insulin aspart  0-15 Units Subcutaneous TID WC  . insulin aspart  0-5 Units Subcutaneous QHS  . insulin aspart  4 Units Subcutaneous TID WC  .  insulin NPH Human  22 Units Subcutaneous BID AC & HS  . losartan  100 mg Oral Daily  . magnesium oxide  400 mg Oral BID  . sodium chloride flush  10-40 mL Intracatheter Q12H   Continuous Infusions: . sodium chloride Stopped (06/09/19 1742)  . sodium chloride 125 mL/hr at 06/10/19 2126  . methocarbamol (ROBAXIN) IV     . vancomycin 750 mg (06/12/19 1415)     LOS: 5 days    Time spent: 25 minutes spent in the coordination of care today.    Erick BlinksJehanzeb Memon, MD [facility]  If 7PM-7AM, please contact night-coverage [email or web address]  06/12/2019, 7:04 PM

## 2019-06-12 NOTE — Progress Notes (Signed)
Physical Therapy Treatment Patient Details Name: Anthony Hubbard MRN: [id number] DOB: 07/20/1986 Today's Date: 06/12/2019    History of Present Illness Patient is a 33 year old male with L LE abcess and cellulitis. he is S/P I & D. WBAT.    PT Comments    Pt progressing well, remains limited due to pain in LLE with movement.  He did progress gt.  He continues to lack dorsiflexion on L ankle and would benefit from prafo boot at rest.  Educated patient on stretching and postioning of LLE to improve ROM.  Continue to recommend HHPT.      Follow Up Recommendations  Home health PT     Equipment Recommendations  Rolling walker with 5" wheels;3in1 (PT)    Recommendations for Other Services       Precautions / Restrictions Precautions Precautions: Fall Precaution Comments: mod fall Restrictions Weight Bearing Restrictions: No    Mobility  Bed Mobility Overal bed mobility: Needs Assistance Bed Mobility: Supine to Sit;Sit to Supine     Supine to sit: Supervision Sit to supine: Supervision   General bed mobility comments: Self assisting LLE into and oob.  Transfers Overall transfer level: Needs assistance Equipment used: Rolling walker (2 wheeled) Transfers: Sit to/from Stand Sit to Stand: Min assist         General transfer comment: Min assistance to brace RW as he pulled into standing cues to push from seated surface next time.  Ambulation/Gait Ambulation/Gait assistance: Min guard Gait Distance (Feet): 60 Feet Assistive device: Rolling walker (2 wheeled) Gait Pattern/deviations: Step-to pattern;Decreased stance time - left;Decreased step length - right;Decreased step length - left;Decreased dorsiflexion - left Gait velocity: decreased   General Gait Details: Cues for weight shifting to L side.  Performed x10 weight shifts in standing before progressing steps forward.  Pt lacks dorsiflexion and required cues for heel strike.   Stairs              Wheelchair Mobility    Modified Rankin (Stroke Patients Only)       Balance Overall balance assessment: Needs assistance Sitting-balance support: Feet supported Sitting balance-Leahy Scale: Normal     Standing balance support: Bilateral upper extremity supported Standing balance-Leahy Scale: Fair                              Cognition Arousal/Alertness: Awake/alert Behavior During Therapy: WFL for tasks assessed/performed Overall Cognitive Status: Within Functional Limits for tasks assessed                                        Exercises General Exercises - Lower Extremity Quad Sets: AROM;Left;10 reps;Supine Other Exercises Other Exercises: Calf/heel cord strecth 3x15 sec.  HS/HC stretch 1x15 sec.  All stretching on LLE.    General Comments        Pertinent Vitals/Pain Pain Assessment: 0-10 Pain Score: 6  Pain Location: L lower leg and ankle Pain Descriptors / Indicators: Aching;Burning;Shooting;Discomfort Pain Intervention(s): Monitored during session;Repositioned    Home Living                      Prior Function            PT Goals (current goals can now be found in the care plan section) Acute Rehab PT Goals Patient Stated Goal: to improve walking Potential to Achieve  Goals: Good Progress towards PT goals: Progressing toward goals    Frequency    Min 3X/week      PT Plan Current plan remains appropriate    Co-evaluation              AM-PAC PT "6 Clicks" Mobility   Outcome Measure  Help needed turning from your back to your side while in a flat bed without using bedrails?: A Little Help needed moving from lying on your back to sitting on the side of a flat bed without using bedrails?: A Little Help needed moving to and from a bed to a chair (including a wheelchair)?: A Little Help needed standing up from a chair using your arms (e.g., wheelchair or bedside chair)?: A Little Help needed to walk  in hospital room?: A Little Help needed climbing 3-5 steps with a railing? : A Lot 6 Click Score: 17    End of Session Equipment Utilized During Treatment: Gait belt Activity Tolerance: Patient tolerated treatment well Patient left: with call bell/phone within reach;in bed Nurse Communication: Mobility status PT Visit Diagnosis: Unsteadiness on feet (R26.81);Muscle weakness (generalized) (M62.81);Difficulty in walking, not elsewhere classified (R26.2);Pain Pain - Right/Left: Left Pain - part of body: Leg     Time: 1716-1739 PT Time Calculation (min) (ACUTE ONLY): 23 min  Charges:  $Gait Training: 8-22 mins $Therapeutic Exercise: 8-22 mins                     Joycelyn RuaAimee Foye Damron, PTA Acute Rehabilitation Services Pager [phone] Office [phone]     Jerline Linzy Artis DelayJ Maciel Kegg 06/12/2019, 5:57 PM

## 2019-06-12 NOTE — Progress Notes (Signed)
Subjective: 2 Days Post-Op Procedure(s) (LRB): IRRIGATION AND DEBRIDEMENT LEFT LOWER EXTREMITY (Left) Patient reports pain as moderate.  Unable to tolerate packing/dressing changes and worried that nobody at home will be able to do this once d/c.  Otherwise, feeling ok. WBC trending down.   Objective: Vital signs in last 24 hours: Temp:  [97.8 F (36.6 C)-98.5 F (36.9 C)] 98.5 F (36.9 C) (07/17 0531) Pulse Rate:  [62-80] 80 (07/17 0531) Resp:  [18] 18 (07/17 0531) BP: (114-130)/(63-94) 126/85 (07/17 0531) SpO2:  [98 %-100 %] 100 % (07/17 0531)  Intake/Output from previous day: 07/16 0701 - 07/17 0700 In: 1900 [P.O.:1400; IV Piggyback:500] Out: 1400 [Urine:1400] Intake/Output this shift: No intake/output data recorded.  Recent Labs    06/12/19 0132  HGB 14.6   Recent Labs    06/12/19 0132  WBC 10.7*  RBC 5.16  HCT 43.9  PLT 342   Recent Labs    06/10/19 2357 06/12/19 0132  NA 131* 135  K 4.2 3.7  CL 95* 100  CO2 21* 24  BUN 19 25*  CREATININE 1.22 0.97  GLUCOSE 419* 224*  CALCIUM 9.3 9.2   No results for input(s): LABPT, INR in the last 72 hours.  Neurologically intact Neurovascular intact Sensation intact distally Intact pulses distally Dorsiflexion/Plantar flexion intact Incision: dressing C/D/I No cellulitis present Compartment soft   Assessment/Plan: 2 Days Post-Op Procedure(s) (LRB): IRRIGATION AND DEBRIDEMENT LEFT LOWER EXTREMITY (Left) Up with therapy WBAT LLE Wet-to-dry dressing changes with packing bid by wound care team Intra-op cx growing gram positive cocci Currently on vancomycin per ID/medicine team F/u with Dr. Erlinda Hong 2 weeks post-op for wound check     Aundra Dubin 06/12/2019, 7:53 AM

## 2019-06-12 NOTE — Progress Notes (Signed)
Pt refuses dsg change this AM. Offered to give pain med. Prior but still verbalizes that he can't take the pain and might passed out. Said that he will not be able to do this dressing changes appropriately at home. Want to wait for whatever the MD advises this morning.

## 2019-06-13 LAB — CREATININE, SERUM
Creatinine, Ser: 1.03 mg/dL (ref 0.61–1.24)
GFR calc Af Amer: >60 mL/min (ref >60)
GFR calc non Af Amer: >60 mL/min (ref >60)

## 2019-06-13 LAB — GLUCOSE, CAPILLARY
Glucose-Capillary: 205 mg/dL — ABNORMAL HIGH (ref 70–99)
Glucose-Capillary: 144 mg/dL — ABNORMAL HIGH (ref 70–99)
Glucose-Capillary: 215 mg/dL — ABNORMAL HIGH (ref 70–99)
Glucose-Capillary: 136 mg/dL — ABNORMAL HIGH (ref 70–99)

## 2019-06-13 LAB — CULTURE, BLOOD (ROUTINE X 2)
Culture: NO GROWTH
Culture: NO GROWTH
Special Requests: ADEQUATE
Special Requests: ADEQUATE

## 2019-06-13 MED ORDER — ENOXAPARIN SODIUM 60 MG/0.6ML ~~LOC~~ SOLN
0.5000 mg/kg | SUBCUTANEOUS | Status: DC
  Administered 2019-06-13 – 2019-06-14 (×2): 55 mg via SUBCUTANEOUS
  Filled 2019-06-13 (×2): qty 0.6

## 2019-06-13 MED ORDER — CEPHALEXIN 500 MG PO CAPS
500.0000 mg | ORAL_CAPSULE | Freq: Four times a day (QID) | ORAL | Status: DC
  Administered 2019-06-13 – 2019-06-15 (×8): 500 mg via ORAL
  Filled 2019-06-13 (×8): qty 1

## 2019-06-13 NOTE — Progress Notes (Signed)
Marland Kitchen  PROGRESS NOTE    Anthony Hubbard  DZH:[id number] DOB: [birth year] DOA: 06/07/2019 PCP: Gregor Hams, FNP   Brief Narrative:   Anthony Hubbard a 33 y.o.Mwith hx IDDM, HTN not currently treatedwho presents with worsening left leg swelling, redness and warmth, failing Augmentin.  The patient was in his usual state of health until mid June, he developed a spontaneous red painful swelling on the front of his left shin. This persisted, gradually getting worse until about 1 week ago, when he developed a much larger red painful swelling just medial to it. He went to urgent care, where he was started on Augmentin. He then came here to the emergency room 7/11, had an MR of the left lower extremity as well as ultrasound of the left lower extremity that showed a superficial venous thrombosis by (US)andmild cellulitis and myositis of the left calf without abscess.  He was benign in appearance and was discharged to continue with Augmentin and follow up with orthopedics, which he was not able to do due to having no insurance.  Now in the last three days, despite taking Augmentin BID as prescribed for last 6 days, he has worsening severe left calf pain, worse with plantarflexion, associated with warmth and swelling. He has had no firmness of the calf as a whole, and no numbness or loss of function of toes. Intermittent fever, no respiratory symptoms.  In the ER, he was tachycardic to 419Q, BP 222L systolic, WBC 79G. Lactate normal. CK normal. He was given IVF, vancomycin and Zosyn. His case was discussed with orthopedics, who will evaluate patient in AM.   Assessment & Plan:   Principal Problem:   Cellulitis and abscess of left leg Active Problems:   Sepsis ([place])   Type 2 diabetes mellitus with hyperglycemia, with long-term current use of insulin (HCC)   Essential hypertension   Hypertensive urgency   Sepsis from left leg cellulitis     - No clinical suspicion for  compartment syndrome due to benign appearing calf, neurovascularly intact, slow clinical course.     - Continue IV fluids     -Currently on vancomycin, ceftriaxone and Flagyl     -Repeat MRI shows lower extremity abscess.     -Seen by orthopedics and underwent irrigation debridement of left lower leg abscess.  Initial intraoperative cultures show gram-positive cocci. -Case reviewed with Dr. Linus Salmons, infectious disease who recommended to continue vancomycin for now, but discontinue ceftriaxone and Flagyl.  Follow-up culture results.  Will likely transition home on doxycycline/Keflex -Patient has concerns about discharging home since he feels he will not able to do twice daily dressing changes.  He feels pain is uncontrolled, particularly with dressing changes.  He feels that he cannot perform the dressing changes himself and his significant other is not frequently around.  Discussed with TOC team and due to his lack of insurance, placement at a skilled nursing facility for wound care would not be an option.  We will need to try and provide home health services to assist with wound care..  Diabetes with hyperglycemia     - Hyperglycemic on admission, AG normal.       - Not taking metformin, Ozempic or Tresiba since being laid off because he can't afford them.     - now on NPH 22 units BID in anticipation of d/c with affordable insulin (likely ReliOn insulin at [facility] best option; DM coordinator to see)     - SSI correction insulin     -  A1c 11.9 -Currently blood sugars are stable  Hypertension/Hypertensive urgency     - BP 181/130 at admission.     - Improving in the ER just with pain control.     - No evidence of end organ damage.     - home HCTZ and losartan     - SW consult for meds access at d/c given no insurance (if unable to get these meds, [facility] has free lisinopril/norvasc)     - increase losartan to 100mg  daily, watch potassium  Superficial venous thrombosis     - Analgesics as  needed     - May have Kpad or other hot compress PRN   DVT prophylaxis: Lovenox Code Status: FULL   Disposition Plan:  Discharge home with home health services   Consultants:   Orthopedics   Antimicrobials:  Marland Kitchen. Vanc 7/12 > 7/18 . Keflex 7/18 >   Subjective: Continues to have pain in his wound which is worse with dressing changes.  Feels the wound is starting to heal.  Concerned about treating his wound after discharge  Objective: Vitals:   06/12/19 1430 06/12/19 2019 06/13/19 0458 06/13/19 1228  BP: 128/87 105/84 (!) 146/90 (!) 135/91  Pulse: 82 77 72 81  Resp: 20 18 18    Temp: 98.7 F (37.1 C) (!) 97.5 F (36.4 C) 98.2 F (36.8 C) 97.9 F (36.6 C)  TempSrc: Oral Oral Oral Oral  SpO2: 99% 99% 96% 98%  Weight:      Height:        Intake/Output Summary (Last 24 hours) at 06/13/2019 1919 Last data filed at 06/13/2019 1548 Gross per 24 hour  Intake 1470.27 ml  Output 2 ml  Net 1468.27 ml   Filed Weights   06/07/19 2332 06/10/19 1425  Weight: 105.9 kg 105.9 kg    Examination:  General exam: Alert, awake, oriented x 3 Respiratory system: Clear to auscultation. Respiratory effort normal. Cardiovascular system:RRR. No murmurs, rubs, gallops. Gastrointestinal system: Abdomen is nondistended, soft and nontender. No organomegaly or masses felt. Normal bowel sounds heard. Central nervous system: Alert and oriented. No focal neurological deficits. Extremities: Left lower extremity is wrapped in dressings Skin: No rashes, lesions or ulcers Psychiatry: Judgement and insight appear normal. Mood & affect appropriate.    Data Reviewed: I have personally reviewed following labs and imaging studies.  CBC: Recent Labs  Lab 06/07/19 1825 06/08/19 0319 06/09/19 0341 06/12/19 0132  WBC 14.6* 13.8* 11.2* 10.7*  NEUTROABS 10.4*  --  6.8  --   HGB 18.5* 15.6 15.5 14.6  HCT 56.7* 48.8 46.9 43.9  MCV 85.6 87.6 86.1 85.1  PLT 369 239 288 342   Basic Metabolic Panel:  Recent Labs  Lab 06/07/19 1825 06/08/19 0319 06/09/19 0341 06/10/19 2357 06/12/19 0132 06/13/19 0353  NA 135 134* 135 131* 135  --   K 4.6 5.4* 3.7 4.2 3.7  --   CL 95* 101 99 95* 100  --   CO2 25 23 22  21* 24  --   GLUCOSE 355* 315* 273* 419* 224*  --   BUN 21* 20 15 19  25*  --   CREATININE 1.16 1.04 0.84 1.22 0.97 1.03  CALCIUM 10.2 9.1 9.3 9.3 9.2  --   MG  --   --  1.6* 1.5* 1.7  --   PHOS  --   --  4.1  --   --   --    GFR: Estimated Creatinine Clearance: 123.7 mL/min (by C-G formula based on  SCr of 1.03 mg/dL). Liver Function Tests: Recent Labs  Lab 06/07/19 1825 06/09/19 0341 06/10/19 2357 06/12/19 0132  AST 31  --  32 27  ALT 44  --  42 34  ALKPHOS 121  --  87 65  BILITOT 0.7  --  0.9 0.6  PROT 9.9*  --  7.3 6.2*  ALBUMIN 4.6 3.3* 3.3* 3.0*   No results for input(s): LIPASE, AMYLASE in the last 168 hours. No results for input(s): AMMONIA in the last 168 hours. Coagulation Profile: No results for input(s): INR, PROTIME in the last 168 hours. Cardiac Enzymes: Recent Labs  Lab 06/07/19 2019  CKTOTAL 74   BNP (last 3 results) No results for input(s): PROBNP in the last 8760 hours. HbA1C: No results for input(s): HGBA1C in the last 72 hours. CBG: Recent Labs  Lab 06/12/19 1700 06/12/19 2016 06/13/19 0745 06/13/19 1230 06/13/19 1703  GLUCAP 179* 154* 205* 144* 215*   Lipid Profile: No results for input(s): CHOL, HDL, LDLCALC, TRIG, CHOLHDL, LDLDIRECT in the last 72 hours. Thyroid Function Tests: No results for input(s): TSH, T4TOTAL, FREET4, T3FREE, THYROIDAB in the last 72 hours. Anemia Panel: No results for input(s): VITAMINB12, FOLATE, FERRITIN, TIBC, IRON, RETICCTPCT in the last 72 hours. Sepsis Labs: Recent Labs  Lab 06/07/19 1825  LATICACIDVEN 1.5    Recent Results (from the past 240 hour(s))  Culture, blood (routine x 2)     Status: None   Collection Time: 06/07/19  7:26 PM   Specimen: BLOOD RIGHT FOREARM  Result Value Ref Range  Status   Specimen Description   Final    BLOOD RIGHT FOREARM Performed at [facility], [address] [place], [place] [address]    Special Requests   Final    BOTTLES DRAWN AEROBIC AND ANAEROBIC Blood Culture adequate volume Performed at [facility], [address] [place], [place] [address]    Culture   Final    NO GROWTH 5 DAYS Performed at [facility], [address] [place], [place] [address]    Report Status 06/13/2019 FINAL  Final  Culture, blood (routine x 2)     Status: None   Collection Time: 06/07/19  7:26 PM   Specimen: BLOOD  Result Value Ref Range Status   Specimen Description   Final    BLOOD LEFT ANTECUBITAL Performed at [facility], [address] [place], [place] [address]    Special Requests   Final    BOTTLES DRAWN AEROBIC AND ANAEROBIC Blood Culture adequate volume Performed at [facility], [address] [place], [place] [address]    Culture   Final    NO GROWTH 5 DAYS Performed at [facility], [address] [place], [place] [address]    Report Status 06/13/2019 FINAL  Final  SARS Coronavirus 2 (CEPHEID - Performed in [facility]), Hosp Order     Status: None   Collection Time: 06/07/19  9:00 PM   Specimen: Nasopharyngeal Swab  Result Value Ref Range Status   SARS Coronavirus 2 NEGATIVE NEGATIVE Final    Comment: (NOTE) If result is NEGATIVE SARS-CoV-2 target nucleic acids are NOT DETECTED. The SARS-CoV-2 RNA is generally detectable in upper and lower  respiratory specimens during the acute phase of infection. The lowest  concentration of SARS-CoV-2 viral copies this assay can detect is 250  copies / mL. A negative result does not preclude SARS-CoV-2 infection  and should not  be used as the sole basis for treatment or other  patient management decisions.  A negative result may occur with  improper specimen collection / handling, submission of specimen  other  than nasopharyngeal swab, presence of viral mutation(s) within the  areas targeted by this assay, and inadequate number of viral copies  (<250 copies / mL). A negative result must be combined with clinical  observations, patient history, and epidemiological information. If result is POSITIVE SARS-CoV-2 target nucleic acids are DETECTED. The SARS-CoV-2 RNA is generally detectable in upper and lower  respiratory specimens dur ing the acute phase of infection.  Positive  results are indicative of active infection with SARS-CoV-2.  Clinical  correlation with patient history and other diagnostic information is  necessary to determine patient infection status.  Positive results do  not rule out bacterial infection or co-infection with other viruses. If result is PRESUMPTIVE POSTIVE SARS-CoV-2 nucleic acids MAY BE PRESENT.   A presumptive positive result was obtained on the submitted specimen  and confirmed on repeat testing.  While 2019 novel coronavirus  (SARS-CoV-2) nucleic acids may be present in the submitted sample  additional confirmatory testing may be necessary for epidemiological  and / or clinical management purposes  to differentiate between  SARS-CoV-2 and other Sarbecovirus currently known to infect humans.  If clinically indicated additional testing with an alternate test  methodology [phone]) is advised. The SARS-CoV-2 RNA is generally  detectable in upper and lower respiratory sp ecimens during the acute  phase of infection. The expected result is Negative. Fact Sheet for Patients:  BoilerBrush.com.cy Fact Sheet for Healthcare Providers: https://pope.com/ This test is not yet approved or cleared by the [place] FDA and has been authorized for detection and/or diagnosis of SARS-CoV-2 by FDA under an Emergency Use Authorization (EUA).  This EUA will remain in effect (meaning this test can be used) for the duration  of the COVID-19 declaration under Section 564(b)(1) of the Act, 21 U.S.C. section 360bbb-3(b)(1), unless the authorization is terminated or revoked sooner. Performed at [facility], [address] [place], [place] [address]   Surgical pcr screen     Status: None   Collection Time: 06/10/19  1:04 AM   Specimen: Nasal Mucosa; Nasal Swab  Result Value Ref Range Status   MRSA, PCR NEGATIVE NEGATIVE Final   Staphylococcus aureus NEGATIVE NEGATIVE Final    Comment: (NOTE) The Xpert SA Assay (FDA approved for NASAL specimens in patients 4 years of age and older), is one component of a comprehensive surveillance program. It is not intended to diagnose infection nor to guide or monitor treatment. Performed at [facility], [address], [place], [place] [address]   Aerobic/Anaerobic Culture (surgical/deep wound)     Status: None (Preliminary result)   Collection Time: 06/10/19  5:50 PM   Specimen: Soft Tissue, Other  Result Value Ref Range Status   Specimen Description ABSCESS LEFT LEG  Final   Special Requests SWAB  Final   Gram Stain   Final    ABUNDANT WBC PRESENT, PREDOMINANTLY PMN MODERATE GRAM POSITIVE COCCI    Culture   Final    MODERATE STAPHYLOCOCCUS AUREUS NO ANAEROBES ISOLATED; CULTURE IN PROGRESS FOR 5 DAYS    Report Status PENDING  Incomplete   Organism ID, Bacteria STAPHYLOCOCCUS AUREUS  Final      Susceptibility   Staphylococcus aureus - MIC*    CIPROFLOXACIN <=0.5 SENSITIVE Sensitive     ERYTHROMYCIN <=0.25 SENSITIVE Sensitive  GENTAMICIN <=0.5 SENSITIVE Sensitive     OXACILLIN <=0.25 SENSITIVE Sensitive     TETRACYCLINE <=1 SENSITIVE Sensitive     VANCOMYCIN <=0.5 SENSITIVE Sensitive     TRIMETH/SULFA <=10 SENSITIVE Sensitive     CLINDAMYCIN <=0.25 SENSITIVE Sensitive     RIFAMPIN <=0.5 SENSITIVE Sensitive     Inducible Clindamycin Value in next row Sensitive      NEGATIVEPerformed at [facility], [address]  [place], [place] [address]    * MODERATE STAPHYLOCOCCUS AUREUS     Radiology Studies: No results found.   Scheduled Meds: . aspirin  81 mg Oral BID  . cephALEXin  500 mg Oral Q6H  . docusate sodium  100 mg Oral BID  . enoxaparin (LOVENOX) injection  0.5 mg/kg Subcutaneous Q24H  . hydrochlorothiazide  12.5 mg Oral Daily  . insulin aspart  0-15 Units Subcutaneous TID WC  . insulin aspart  0-5 Units Subcutaneous QHS  . insulin aspart  4 Units Subcutaneous TID WC  . insulin NPH Human  22 Units Subcutaneous BID AC & HS  . losartan  100 mg Oral Daily  . magnesium oxide  400 mg Oral BID  . sodium chloride flush  10-40 mL Intracatheter Q12H   Continuous Infusions: . sodium chloride Stopped (06/09/19 1742)  . sodium chloride Stopped (06/11/19 0504)  . methocarbamol (ROBAXIN) IV       LOS: 6 days    Time spent: 25 minutes spent in the coordination of care today.    Erick BlinksJehanzeb Memon, MD [facility]  If 7PM-7AM, please contact night-coverage [email or web address]  06/13/2019, 7:19 PM

## 2019-06-13 NOTE — Progress Notes (Signed)
Physical Therapy Treatment Patient Details Name: Anthony MusaCarlos Gomez-Bello MRN: [id number] DOB: [birth year] Today's Date: 06/13/2019    History of Present Illness Patient is a 33 year old male with L LE abcess and cellulitis. he is S/P I & D. WBAT.    PT Comments    Pt progressing steadily towards physical therapy goals, very motivated and receptive to education. Ambulating 60 feet with walker and min guard assist. Continues with decreased left heel strike and weightbearing. Educated pt on ankle AROM exercises and use of PRAFO boot. Will continue to address gait training.    Follow Up Recommendations  Home health PT     Equipment Recommendations  Rolling walker with 5" wheels;3in1 (PT)    Recommendations for Other Services       Precautions / Restrictions Precautions Precautions: Fall Restrictions Weight Bearing Restrictions: No    Mobility  Bed Mobility               General bed mobility comments: In bathroom with NT on arrival  Transfers Overall transfer level: Needs assistance Equipment used: Rolling walker (2 wheeled) Transfers: Sit to/from Stand Sit to Stand: Supervision            Ambulation/Gait Ambulation/Gait assistance: Min guard Gait Distance (Feet): 60 Feet Assistive device: Rolling walker (2 wheeled) Gait Pattern/deviations: Decreased stance time - left;Decreased dorsiflexion - left;Step-through pattern;Decreased stride length Gait velocity: decreased Gait velocity interpretation: <1.8 ft/sec, indicate of risk for recurrent falls General Gait Details: Heavy reliance on walker, decreased LLE weightbearing. Cues for sequencing, heel strike at initial contact, increasing weightbearing, activity pacing   Stairs             Wheelchair Mobility    Modified Rankin (Stroke Patients Only)       Balance Overall balance assessment: Needs assistance Sitting-balance support: Feet supported Sitting balance-Leahy Scale: Normal     Standing  balance support: Bilateral upper extremity supported Standing balance-Leahy Scale: Fair                              Cognition Arousal/Alertness: Awake/alert Behavior During Therapy: WFL for tasks assessed/performed Overall Cognitive Status: Within Functional Limits for tasks assessed                                        Exercises Other Exercises Other Exercises: "A to Z's" ankle ROM exercises for inversion/eversion, dorsiflexion/plantaflexion    General Comments        Pertinent Vitals/Pain Pain Assessment: Faces Faces Pain Scale: Hurts little more Pain Location: LLE Pain Descriptors / Indicators: Burning Pain Intervention(s): Monitored during session    Home Living                      Prior Function            PT Goals (current goals can now be found in the care plan section) Acute Rehab PT Goals Patient Stated Goal: to improve walking Potential to Achieve Goals: Good Progress towards PT goals: Progressing toward goals    Frequency    Min 3X/week      PT Plan Current plan remains appropriate    Co-evaluation              AM-PAC PT "6 Clicks" Mobility   Outcome Measure  Help needed turning from your back to your side  while in a flat bed without using bedrails?: None Help needed moving from lying on your back to sitting on the side of a flat bed without using bedrails?: None Help needed moving to and from a bed to a chair (including a wheelchair)?: None Help needed standing up from a chair using your arms (e.g., wheelchair or bedside chair)?: A Little Help needed to walk in hospital room?: A Little Help needed climbing 3-5 steps with a railing? : A Lot 6 Click Score: 20    End of Session   Activity Tolerance: Patient tolerated treatment well Patient left: in chair;with call bell/phone within reach Nurse Communication: Mobility status PT Visit Diagnosis: Unsteadiness on feet (R26.81);Muscle weakness  (generalized) (M62.81);Difficulty in walking, not elsewhere classified (R26.2);Pain Pain - Right/Left: Left Pain - part of body: Leg     Time: 9574-7340 PT Time Calculation (min) (ACUTE ONLY): 27 min  Charges:  $Gait Training: 23-37 mins                     Ellamae Sia, [place], DPT Acute Rehabilitation Services Pager [phone] Office [phone]    Willy Eddy 06/13/2019, 4:48 PM

## 2019-06-13 NOTE — Progress Notes (Signed)
Orthopedic Tech Progress Note Patient Details:  Anthony Hubbard [birth year] [id number]  Ortho Devices Type of Ortho Device: Prafo boot/shoe Ortho Device/Splint Location: LLE Ortho Device/Splint Interventions: Application, Ordered   Post Interventions Patient Tolerated: Well Instructions Provided: Adjustment of device, Care of device   [name] 06/13/2019, 12:33 PM

## 2019-06-14 LAB — CREATININE, SERUM
Creatinine, Ser: 0.83 mg/dL (ref 0.61–1.24)
GFR calc Af Amer: >60 mL/min (ref >60)
GFR calc non Af Amer: >60 mL/min (ref >60)

## 2019-06-14 LAB — GLUCOSE, CAPILLARY
Glucose-Capillary: 118 mg/dL — ABNORMAL HIGH (ref 70–99)
Glucose-Capillary: 271 mg/dL — ABNORMAL HIGH (ref 70–99)
Glucose-Capillary: 132 mg/dL — ABNORMAL HIGH (ref 70–99)
Glucose-Capillary: 148 mg/dL — ABNORMAL HIGH (ref 70–99)

## 2019-06-14 NOTE — Progress Notes (Signed)
Marland Kitchen  PROGRESS NOTE    Anthony Hubbard  ZOX:[id number] DOB: [birth year] DOA: 06/07/2019 PCP: Maudie Flakes, FNP   Brief Narrative:   Eann Cleland a 33 y.o.Mwith hx IDDM, HTN not currently treatedwho presents with worsening left leg swelling, redness and warmth, failing Augmentin.  The patient was in his usual state of health until mid June, he developed a spontaneous red painful swelling on the front of his left shin. This persisted, gradually getting worse until about 1 week ago, when he developed a much larger red painful swelling just medial to it. He went to urgent care, where he was started on Augmentin. He then came here to the emergency room 7/11, had an MR of the left lower extremity as well as ultrasound of the left lower extremity that showed a superficial venous thrombosis by (US)andmild cellulitis and myositis of the left calf without abscess.  He was benign in appearance and was discharged to continue with Augmentin and follow up with orthopedics, which he was not able to do due to having no insurance.  Now in the last three days, despite taking Augmentin BID as prescribed for last 6 days, he has worsening severe left calf pain, worse with plantarflexion, associated with warmth and swelling. He has had no firmness of the calf as a whole, and no numbness or loss of function of toes. Intermittent fever, no respiratory symptoms.  In the ER, he was tachycardic to 120s, BP 180s systolic, WBC 14K. Lactate normal. CK normal. He was given IVF, vancomycin and Zosyn. His case was discussed with orthopedics, who will evaluate patient in AM.   Assessment & Plan:   Principal Problem:   Cellulitis and abscess of left leg Active Problems:   Sepsis (HCC)   Type 2 diabetes mellitus with hyperglycemia, with long-term current use of insulin (HCC)   Essential hypertension   Hypertensive urgency   Sepsis from left leg cellulitis     - No clinical suspicion for  compartment syndrome due to benign appearing calf, neurovascularly intact, slow clinical course.     - Continue IV fluids     - He was treated with vancomycin, ceftriaxone and Flagyl     -Repeat MRI shows lower extremity abscess.     -Seen by orthopedics and underwent irrigation debridement of left lower leg abscess.  Initial intraoperative cultures show gram-positive cocci. -Case reviewed with Dr. Luciana Axe, infectious disease who recommended to continue vancomycin, but discontinue ceftriaxone and Flagyl.  Wound culture positive for MSSA, so antibiotics transitioned to oral keflex -Patient has concerns about discharging home since he feels he will not able to do twice daily dressing changes.  He feels pain is uncontrolled, particularly with dressing changes.  He feels that he cannot perform the dressing changes himself and his significant other is not frequently around.  Discussed with TOC team and due to his lack of insurance, placement at a skilled nursing facility for wound care would not be an option. Discussed with patient and he will likely plan on staying with his uncle and aunt after discharge who may be able to help him with dressing changes. Will discharge home tomorrow since he can get his meds from [facility] pharmacy and family will be available to help care for him.  Diabetes with hyperglycemia     - Hyperglycemic on admission, AG normal.       - Not taking metformin, Ozempic or Tresiba since being laid off because he can't afford them.     - now  on NPH 22 units BID in anticipation of d/c with affordable insulin (likely [facility] insulin [facility] best [facility]; DM coordinator to see)     - SSI correction insulin     - A1c 11.9 -Currently blood sugars are stable  Hypertension/Hypertensive urgency     - BP 181/130 at admission.     - Improving in the ER just with pain control.     - No evidence of end organ damage.     - home HCTZ and losartan     - SW consult for meds access at d/c given no  insurance (if unable to get these meds, [facility] has free lisinopril/norvasc)     - increase losartan to 100mg  daily, watch potassium -blood pressures have improved.  Superficial venous thrombosis     - Analgesics as needed     - May have Kpad or other hot compress PRN   DVT prophylaxis: Lovenox Code Status: FULL   Disposition Plan:  Discharge home with home health services in AM   Consultants:   Orthopedics   Antimicrobials:  Marland Kitchen. Vanc 7/12 > 7/18 . Keflex 7/18 >   Subjective: Noticing some muscle spasms in LLE. Still becomes light headed during dressing changes. He is working on securing resources to help with dressing changes and wound management after discharge  Objective: Vitals:   06/12/19 2019 06/13/19 0458 06/13/19 1228 06/13/19 2140  BP: 105/84 (!) 146/90 (!) 135/91 (!) 139/97  Pulse: 77 72 81 94  Resp: 18 18  18   Temp: (!) 97.5 F (36.4 C) 98.2 F (36.8 C) 97.9 F (36.6 C) 98.1 F (36.7 C)  TempSrc: Oral Oral Oral Oral  SpO2: 99% 96% 98% 100%  Weight:      Height:        Intake/Output Summary (Last 24 hours) at 06/14/2019 1033 Last data filed at 06/14/2019 [id number] Gross per 24 hour  Intake 702 ml  Output -  Net 702 ml   Filed Weights   06/07/19 2332 06/10/19 1425  Weight: 105.9 kg 105.9 kg    Examination: General exam: Alert, awake, oriented x 3 Respiratory system: Clear to auscultation. Respiratory effort normal. Cardiovascular system:RRR. No murmurs, rubs, gallops. Gastrointestinal system: Abdomen is nondistended, soft and nontender. No organomegaly or masses felt. Normal bowel sounds heard. Central nervous system: Alert and oriented. No focal neurological deficits. Extremities: left lower extremity wrapped in dressing and ACE bandage Skin: No rashes, lesions or ulcers Psychiatry: Judgement and insight appear normal. Mood & affect appropriate.   Data Reviewed: I have personally reviewed following labs and imaging studies.  CBC: Recent Labs  Lab  06/07/19 1825 06/08/19 0319 06/09/19 0341 06/12/19 0132  WBC 14.6* 13.8* 11.2* 10.7*  NEUTROABS 10.4*  --  6.8  --   HGB 18.5* 15.6 15.5 14.6  HCT 56.7* 48.8 46.9 43.9  MCV 85.6 87.6 86.1 85.1  PLT 369 239 288 342   Basic Metabolic Panel: Recent Labs  Lab 06/07/19 1825 06/08/19 0319 06/09/19 0341 06/10/19 2357 06/12/19 0132 06/13/19 0353 06/14/19 0320  NA 135 134* 135 131* 135  --   --   K 4.6 5.4* 3.7 4.2 3.7  --   --   CL 95* 101 99 95* 100  --   --   CO2 25 23 22  21* 24  --   --   GLUCOSE 355* 315* 273* 419* 224*  --   --   BUN 21* 20 15 19  25*  --   --  CREATININE 1.16 1.04 0.84 1.22 0.97 1.03 0.83  CALCIUM 10.2 9.1 9.3 9.3 9.2  --   --   MG  --   --  1.6* 1.5* 1.7  --   --   PHOS  --   --  4.1  --   --   --   --    GFR: Estimated Creatinine Clearance: 153.5 mL/min (by C-G formula based on SCr of 0.83 mg/dL). Liver Function Tests: Recent Labs  Lab 06/07/19 1825 06/09/19 0341 06/10/19 2357 06/12/19 0132  AST 31  --  32 27  ALT 44  --  42 34  ALKPHOS 121  --  87 65  BILITOT 0.7  --  0.9 0.6  PROT 9.9*  --  7.3 6.2*  ALBUMIN 4.6 3.3* 3.3* 3.0*   No results for input(s): LIPASE, AMYLASE in the last 168 hours. No results for input(s): AMMONIA in the last 168 hours. Coagulation Profile: No results for input(s): INR, PROTIME in the last 168 hours. Cardiac Enzymes: Recent Labs  Lab 06/07/19 2019  CKTOTAL 74   BNP (last 3 results) No results for input(s): PROBNP in the last 8760 hours. HbA1C: No results for input(s): HGBA1C in the last 72 hours. CBG: Recent Labs  Lab 06/13/19 0745 06/13/19 1230 06/13/19 1703 06/13/19 2137 06/14/19 0815  GLUCAP 205* 144* 215* 136* 118*   Lipid Profile: No results for input(s): CHOL, HDL, LDLCALC, TRIG, CHOLHDL, LDLDIRECT in the last 72 hours. Thyroid Function Tests: No results for input(s): TSH, T4TOTAL, FREET4, T3FREE, THYROIDAB in the last 72 hours. Anemia Panel: No results for input(s): VITAMINB12, FOLATE,  FERRITIN, TIBC, IRON, RETICCTPCT in the last 72 hours. Sepsis Labs: Recent Labs  Lab 06/07/19 1825  LATICACIDVEN 1.5    Recent Results (from the past 240 hour(s))  Culture, blood (routine x 2)     Status: None   Collection Time: 06/07/19  7:26 PM   Specimen: BLOOD RIGHT FOREARM  Result Value Ref Range Status   Specimen Description   Final    BLOOD RIGHT FOREARM Performed at [facility], [address] [place], [place] [address]    Special Requests   Final    BOTTLES DRAWN AEROBIC AND ANAEROBIC Blood Culture adequate volume Performed at [facility], [address] [place], [place] [address]    Culture   Final    NO GROWTH 5 DAYS Performed at [facility], [address] [place], [place] [address]    Report Status 06/13/2019 FINAL  Final  Culture, blood (routine x 2)     Status: None   Collection Time: 06/07/19  7:26 PM   Specimen: BLOOD  Result Value Ref Range Status   Specimen Description   Final    BLOOD LEFT ANTECUBITAL Performed at [facility], [address] [place], [place] [address]    Special Requests   Final    BOTTLES DRAWN AEROBIC AND ANAEROBIC Blood Culture adequate volume Performed at [facility], [address] [place], [place] [address]    Culture   Final    NO GROWTH 5 DAYS Performed at [facility], [address] [place], [place] [address]    Report Status 06/13/2019 FINAL  Final  SARS Coronavirus 2 (CEPHEID - Performed in [facility] lab), Hosp Order     Status: None   Collection Time: 06/07/19  9:00 PM   Specimen: Nasopharyngeal Swab  Result Value Ref Range Status  SARS Coronavirus 2 NEGATIVE NEGATIVE Final    Comment: (NOTE) If result is NEGATIVE SARS-CoV-2 target nucleic acids are NOT DETECTED. The SARS-CoV-2 RNA is generally detectable in upper and lower  respiratory specimens during the acute phase of infection. The lowest  concentration of  SARS-CoV-2 viral copies this assay can detect is 250  copies / mL. A negative result does not preclude SARS-CoV-2 infection  and should not be used as the sole basis for treatment or other  patient management decisions.  A negative result may occur with  improper specimen collection / handling, submission of specimen other  than nasopharyngeal swab, presence of viral mutation(s) within the  areas targeted by this assay, and inadequate number of viral copies  (<250 copies / mL). A negative result must be combined with clinical  observations, patient history, and epidemiological information. If result is POSITIVE SARS-CoV-2 target nucleic acids are DETECTED. The SARS-CoV-2 RNA is generally detectable in upper and lower  respiratory specimens dur ing the acute phase of infection.  Positive  results are indicative of active infection with SARS-CoV-2.  Clinical  correlation with patient history and other diagnostic information is  necessary to determine patient infection status.  Positive results do  not rule out bacterial infection or co-infection with other viruses. If result is PRESUMPTIVE POSTIVE SARS-CoV-2 nucleic acids MAY BE PRESENT.   A presumptive positive result was obtained on the submitted specimen  and confirmed on repeat testing.  While 2019 novel coronavirus  (SARS-CoV-2) nucleic acids may be present in the submitted sample  additional confirmatory testing may be necessary for epidemiological  and / or clinical management purposes  to differentiate between  SARS-CoV-2 and other Sarbecovirus currently known to infect humans.  If clinically indicated additional testing with an alternate test  methodology [id number]) is advised. The SARS-CoV-2 RNA is generally  detectable in upper and lower respiratory sp ecimens during the acute  phase of infection. The expected result is Negative. Fact Sheet for Patients:  BoilerBrush.com.cyhttps://www.fda.gov/media/136312/download Fact Sheet for Healthcare  Providers: https://pope.com/https://www.fda.gov/media/136313/download This test is not yet approved or cleared by the [place] FDA and has been authorized for detection and/or diagnosis of SARS-CoV-2 by FDA under an Emergency Use Authorization (EUA).  This EUA will remain in effect (meaning this test can be used) for the duration of the COVID-19 declaration under Section 564(b)(1) of the Act, 21 U.S.C. section 360bbb-3(b)(1), unless the authorization is terminated or revoked sooner. Performed at [facility], [address] [place], [place] [address]   Surgical pcr screen     Status: None   Collection Time: 06/10/19  1:04 AM   Specimen: Nasal Mucosa; Nasal Swab  Result Value Ref Range Status   MRSA, PCR NEGATIVE NEGATIVE Final   Staphylococcus aureus NEGATIVE NEGATIVE Final    Comment: (NOTE) The Xpert SA Assay (FDA approved for NASAL specimens in patients 31 years of age and older), is one component of a comprehensive surveillance program. It is not intended to diagnose infection nor to guide or monitor treatment. Performed at [facility], [address] [place], [place] [address]   Aerobic/Anaerobic Culture (surgical/deep wound)     Status: None (Preliminary result)   Collection Time: 06/10/19  5:50 PM   Specimen: Soft Tissue, Other  Result Value Ref Range Status   Specimen Description ABSCESS LEFT LEG  Final   Special Requests SWAB  Final   Gram Stain   Final    ABUNDANT WBC PRESENT, PREDOMINANTLY PMN MODERATE GRAM  POSITIVE COCCI    Culture   Final    MODERATE STAPHYLOCOCCUS AUREUS NO ANAEROBES ISOLATED; CULTURE IN PROGRESS FOR 5 DAYS    Report Status PENDING  Incomplete   Organism ID, Bacteria STAPHYLOCOCCUS AUREUS  Final      Susceptibility   Staphylococcus aureus - MIC*    CIPROFLOXACIN <=0.5 SENSITIVE Sensitive     ERYTHROMYCIN <=0.25 SENSITIVE Sensitive     GENTAMICIN <=0.5 SENSITIVE Sensitive     OXACILLIN <=0.25 SENSITIVE Sensitive      TETRACYCLINE <=1 SENSITIVE Sensitive     VANCOMYCIN <=0.5 SENSITIVE Sensitive     TRIMETH/SULFA <=10 SENSITIVE Sensitive     CLINDAMYCIN <=0.25 SENSITIVE Sensitive     RIFAMPIN <=0.5 SENSITIVE Sensitive     Inducible Clindamycin Value in next row Sensitive      NEGATIVEPerformed at [address], [place], [place] [address]    * MODERATE STAPHYLOCOCCUS AUREUS     Radiology Studies: No results found.   Scheduled Meds: . aspirin  81 mg Oral BID  . cephALEXin  500 mg Oral Q6H  . docusate sodium  100 mg Oral BID  . enoxaparin (LOVENOX) injection  0.5 mg/kg Subcutaneous Q24H  . hydrochlorothiazide  12.5 mg Oral Daily  . insulin aspart  0-15 Units Subcutaneous TID WC  . insulin aspart  0-5 Units Subcutaneous QHS  . insulin aspart  4 Units Subcutaneous TID WC  . insulin NPH Human  22 Units Subcutaneous BID AC & HS  . losartan  100 mg Oral Daily  . magnesium oxide  400 mg Oral BID  . sodium chloride flush  10-40 mL Intracatheter Q12H   Continuous Infusions: . sodium chloride Stopped (06/09/19 1742)  . sodium chloride 125 mL/hr at 06/14/19 0212  . methocarbamol (ROBAXIN) IV       LOS: 7 days    Time spent: 25 minutes spent in the coordination of care today.    Kathie Dike, MD [facility]  If 7PM-7AM, please contact night-coverage [email or web address]  06/14/2019, 10:33 AM

## 2019-06-14 NOTE — Progress Notes (Signed)
Dressing to left leg changed.  Old dressing removed.  Old dressing with a moderate amount of serosanguineous drainage noted.  Would is open, clean, and dry.  Wound is progressing with healing.  Wound cleaned with Normal Saline.   Wound packed with wet guaze and covered with dry glaze, ABD pad , Kerlix and ABD pad.  Wound also wrapped in Ace wrap.  Pt. Was very anxious during the dressing change.  He reports that when dressing changes are done he feels like gets light headed and very nervous.  Patient tolerated the wound care.  Patient was pre medicated prior to dressing change.  All belongings placed in patient's reach and call bell placed in patient reach.  Pt. Had a boot on left leg.  He would like for the boot to remain off at this time.  He reports the boot was causing pressure on his foot and therefore causing some additional pain.

## 2019-06-14 NOTE — Progress Notes (Signed)
Physical Therapy Treatment Patient Details Name: Anthony Hubbard MRN: [id number] DOB: February 29, 1986 Today's Date: 06/14/2019    History of Present Illness Patient is a 33 year old male with L LE abcess and cellulitis. he is S/P I & D. WBAT.    PT Comments    Pt making progress towards physical therapy goals, remains very motivated. Session focused on gait training to promote increased LLE weightbearing and heel strike at initial contact. Pt with increased ambulation distance to 100 feet using walker, supervision for safety. Continues to work on ankle AROM exercises. D/c plan remains appropriate.     Follow Up Recommendations  Home health PT     Equipment Recommendations  Rolling walker with 5" wheels;3in1 (PT)    Recommendations for Other Services       Precautions / Restrictions Precautions Precautions: Fall Restrictions Weight Bearing Restrictions: No    Mobility  Bed Mobility               General bed mobility comments: Out of bed in chair  Transfers Overall transfer level: Modified independent Equipment used: Rolling walker (2 wheeled)                Ambulation/Gait Ambulation/Gait assistance: Supervision Gait Distance (Feet): 100 Feet Assistive device: Rolling walker (2 wheeled) Gait Pattern/deviations: Decreased stance time - left;Decreased dorsiflexion - left;Step-through pattern;Decreased stride length Gait velocity: decreased   General Gait Details: Cues for increased LLE weightbearing, heel strike at initial contact, decreased supination, and toes pointed forward. Pt very receptive to cues and able to correct mostly until fatigued. Still with moderate reliance through arms on walker   Stairs             Wheelchair Mobility    Modified Rankin (Stroke Patients Only)       Balance Overall balance assessment: Needs assistance Sitting-balance support: Feet supported Sitting balance-Leahy Scale: Normal     Standing balance support:  Bilateral upper extremity supported Standing balance-Leahy Scale: Fair                              Cognition Arousal/Alertness: Awake/alert Behavior During Therapy: WFL for tasks assessed/performed Overall Cognitive Status: Within Functional Limits for tasks assessed                                        Exercises Other Exercises Other Exercises: Ankle AROM  Other Exercises: Lateral weight shifts x 10 Other Exercises: Standing: hip flexion marches x 20     General Comments        Pertinent Vitals/Pain Pain Assessment: Faces Faces Pain Scale: Hurts little more Pain Location: LLE Pain Descriptors / Indicators: Burning Pain Intervention(s): Monitored during session    Home Living                      Prior Function            PT Goals (current goals can now be found in the care plan section) Acute Rehab PT Goals Patient Stated Goal: to improve walking Potential to Achieve Goals: Good Progress towards PT goals: Progressing toward goals    Frequency    Min 3X/week      PT Plan Current plan remains appropriate    Co-evaluation              AM-PAC PT "6  Clicks" Mobility   Outcome Measure  Help needed turning from your back to your side while in a flat bed without using bedrails?: None Help needed moving from lying on your back to sitting on the side of a flat bed without using bedrails?: None Help needed moving to and from a bed to a chair (including a wheelchair)?: None Help needed standing up from a chair using your arms (e.g., wheelchair or bedside chair)?: A Little Help needed to walk in hospital room?: A Little Help needed climbing 3-5 steps with a railing? : A Lot 6 Click Score: 20    End of Session   Activity Tolerance: Patient tolerated treatment well Patient left: in chair;with call bell/phone within reach Nurse Communication: Mobility status PT Visit Diagnosis: Unsteadiness on feet (R26.81);Muscle  weakness (generalized) (M62.81);Difficulty in walking, not elsewhere classified (R26.2);Pain Pain - Right/Left: Left Pain - part of body: Leg     Time: 4128-7867 PT Time Calculation (min) (ACUTE ONLY): 20 min  Charges:  $Gait Training: 8-22 mins                     Ellamae Sia, PT, DPT Acute Rehabilitation Services Pager [phone] Office [phone]    Willy Eddy 06/14/2019, 3:16 PM

## 2019-06-15 LAB — GLUCOSE, CAPILLARY
Glucose-Capillary: 107 mg/dL — ABNORMAL HIGH (ref 70–99)
Glucose-Capillary: 204 mg/dL — ABNORMAL HIGH (ref 70–99)

## 2019-06-15 LAB — AEROBIC/ANAEROBIC CULTURE W GRAM STAIN (SURGICAL/DEEP WOUND)

## 2019-06-15 MED ORDER — POLYETHYLENE GLYCOL 3350 17 G PO PACK: 17.0000 g | PACK | Freq: Every day | ORAL | 0 refills | Status: DC | PRN

## 2019-06-15 MED ORDER — DOCUSATE SODIUM 100 MG PO CAPS: 100.0000 mg | ORAL_CAPSULE | Freq: Two times a day (BID) | ORAL | 0 refills | Status: DC

## 2019-06-15 MED ORDER — INSULIN NPH (HUMAN) (ISOPHANE) 100 UNIT/ML ~~LOC~~ SUSP: 22.0000 [IU] | Freq: Two times a day (BID) | SUBCUTANEOUS | 11 refills | Status: DC

## 2019-06-15 MED ORDER — LOSARTAN POTASSIUM 100 MG PO TABS: 100.0000 mg | ORAL_TABLET | Freq: Every day | ORAL | 0 refills | Status: DC

## 2019-06-15 MED ORDER — CEPHALEXIN 500 MG PO CAPS: 500.0000 mg | ORAL_CAPSULE | Freq: Four times a day (QID) | ORAL | 0 refills | Status: DC

## 2019-06-15 MED ORDER — BLOOD GLUCOSE MONITOR KIT: PACK | 0 refills | Status: DC

## 2019-06-15 MED ORDER — OXYCODONE-ACETAMINOPHEN 5-325 MG PO TABS: 1.0000 | ORAL_TABLET | Freq: Four times a day (QID) | ORAL | 0 refills | Status: DC | PRN

## 2019-06-15 MED ORDER — "INSULIN SYRINGE-NEEDLE U-100 28G X 1/2"" 1 ML MISC": 0 refills | Status: DC

## 2019-06-15 MED ORDER — METFORMIN HCL 500 MG PO TABS: 500.0000 mg | ORAL_TABLET | Freq: Two times a day (BID) | ORAL | 0 refills | Status: DC

## 2019-06-15 MED ORDER — HYDROCHLOROTHIAZIDE 12.5 MG PO CAPS: 12.5000 mg | ORAL_CAPSULE | Freq: Every day | ORAL | 0 refills | Status: DC

## 2019-06-15 MED FILL — HumuLIN N 100 UNIT/ML SUSP: 100 | 23 days supply | Qty: 10 | Fill #0

## 2019-06-15 MED FILL — ULTICARE INS 0.3 ML 30GX1/2: 30G X 1/2" | 30 days supply | Qty: 60 | Fill #0

## 2019-06-15 MED FILL — LOSARTAN POTASSIUM 100 MG T: 100 | 30 days supply | Qty: 30 | Fill #0

## 2019-06-15 MED FILL — metFORMIN HCL 500 MG TABS: 500 | 30 days supply | Qty: 90 | Fill #0

## 2019-06-15 MED FILL — OXYCODONE W/APAP 5/325 TAB: 5-325 | 4 days supply | Qty: 30 | Fill #0

## 2019-06-15 MED FILL — HYDROCHLOROTHIAZIDE 12.5 MG: 12.5 | 30 days supply | Qty: 30 | Fill #0

## 2019-06-15 MED FILL — DOK 100 MG CAPS: 100 | 5 days supply | Qty: 10 | Fill #0

## 2019-06-15 MED FILL — CEPHALEXIN 500 MG CAPSULE: 500 | 5 days supply | Qty: 20 | Fill #0

## 2019-06-15 MED FILL — POLYETHYLENE GLYCOL 3350 PO: 17 | 14 days supply | Qty: 238 | Fill #0

## 2019-06-15 NOTE — TOC Progression Note (Signed)
Transition of Care [facility]) - Progression Note    Patient Details  Name: Anthony Hubbard MRN: [id number] Date of Birth: 03-12-1986  [facility] CM/SW Contact  Jacalyn Lefevre Edson Snowball, RN Phone Number: 06/15/2019, 1:22 PM  Clinical Narrative:      See prior note. Patient entered in [facility] program with over ride for oxycodone  Expected Discharge Plan: [place] Barriers to Discharge: No Barriers Identified  Expected Discharge Plan and Services Expected Discharge Plan: [place] In-house Referral: Financial Counselor Discharge Planning Services: CM Consult Post Acute Care Choice: Home Health, Durable Medical Equipment Living arrangements for the past 2 months: Single Family Home Expected Discharge Date: 06/15/19               DME Arranged: 3-N-1, [place]ford Rile rolling DME Agency: [employer] Date DME Agency Contacted: 06/15/19 Time DME Agency Contacted: 60 Representative spoke with at DME Agency: [place]: RN, PT [place]: [facility] Date [place]: 06/15/19 Time [place]: 1321 Representative spoke with at [place]: [place] ([place]) Interventions    Readmission Risk Interventions No flowsheet data found.

## 2019-06-15 NOTE — Discharge Summary (Signed)
Physician Discharge Summary  Anthony Hubbard XIH:[id number] DOB: 18-Dec-1985 DOA: 06/07/2019  PCP: Anthony Hams, FNP  Admit date: 06/07/2019 Discharge date: 06/15/2019  Admitted From: Home Disposition: Home  Recommendations for Outpatient Follow-up:  1. Follow up with PCP in 1-2 weeks 2. Please obtain BMP/CBC in one week 3. Follow-up with orthopedics in 2 weeks 4. Follow-up at [facility] wellness clinic scheduled for 7/29  Home Health: Home health RN, PT Equipment/Devices: Walker  Discharge Condition: Stable CODE STATUS: Full code Diet recommendation: Heart healthy, carb modified  Brief/Interim Summary: Anthony Hubbard a 33 y.o.Mwith hx IDDM, HTN not currently treatedwho presents with worsening left leg swelling, redness and warmth, failing Augmentin.  The patient was in his usual state of health until mid June, he developed a spontaneous red painful swelling on the front of his left shin. This persisted, gradually getting worse until about 1 week ago, when he developed a much larger red painful swelling just medial to it. He went to urgent care, where he was started on Augmentin. He then came here to the emergency room 7/11, had an MR of the left lower extremity as well as ultrasound of the left lower extremity that showed a superficial venous thrombosis by (US)andmild cellulitis and myositis of the left calf without abscess.  He was benign in appearance and was discharged to continue with Augmentin and follow up with orthopedics, which he was not able to do due to having no insurance.  Now in the last three days, despite taking Augmentin BID as prescribed for last 6 days, he has worsening severe left calf pain, worse with plantarflexion, associated with warmth and swelling. He has had no firmness of the calf as a whole, and no numbness or loss of function of toes. Intermittent fever, no respiratory symptoms.  In the ER, he was tachycardic to 349Z, BP 791T systolic,  WBC 05W. Lactate normal. CK normal. He was given IVF, vancomycin and Zosyn. His case was discussed with orthopedics, who will evaluate patient in AM.  Discharge Diagnoses:  Principal Problem:   Cellulitis and abscess of left leg Active Problems:   Sepsis ([place])   Type 2 diabetes mellitus with hyperglycemia, with long-term current use of insulin (HCC)   Essential hypertension   Hypertensive urgency  1. Sepsis secondary to left leg cellulitis and abscess.  Initially treated with vancomycin, ceftriaxone and Flagyl.  MRI showed lower extremity abscess.  Seen by orthopedics underwent irrigation debridement.  Intraoperative cultures positive for MSSA.  He was treated with appropriate antibiotics and discharged on Keflex.  He will continue wound care dressings twice daily.  He has been set up with home health services.  He will follow-up with orthopedics in the next 2 weeks. 2. Diabetes.  Previously on metformin, Ozempic and [place].  Unable to take this medication since he is lost his health insurance.  Started on NPH with fair control of blood sugars.  This is something he should be able to afford post discharge.  A1c of 11.9.  Blood sugars improved after starting NPH. 3. Hypertension.  Started on hydrochlorothiazide and losartan.  Blood pressure was significantly improved at discharge.  Discharge Instructions  Discharge Instructions    Ambulatory referral to Nutrition and Diabetic Education   Complete by: As directed    Diet - low sodium heart healthy   Complete by: As directed    Face-to-face encounter (required for Medicare/Medicaid patients)   Complete by: As directed    I Anthony Hubbard certify that this patient is under  my care and that I, or a nurse practitioner or physician's assistant working with me, had a face-to-face encounter that meets the physician face-to-face encounter requirements with this patient on 06/15/2019. The encounter with the patient was in whole, or in part for the  following medical condition(s) which is the primary reason for home health care (List medical condition): wound care   The encounter with the patient was in whole, or in part, for the following medical condition, which is the primary reason for home health care: wound care   I certify that, based on my findings, the following services are medically necessary home health services:  Nursing Physical therapy     Reason for Medically Necessary Home Health Services: Skilled Nursing- Complex Wound Care   My clinical findings support the need for the above services: Pain interferes with ambulation/mobility   Further, I certify that my clinical findings support that this patient is homebound due to: Pain interferes with ambulation/mobility   Home Health   Complete by: As directed    Wet to dry dressing to left leg BID, wrapped in ACE bandage   To provide the following care/treatments:  PT OT RN     Increase activity slowly   Complete by: As directed      Allergies as of 06/15/2019   No Known Allergies     Medication List    TAKE these medications   aspirin 325 MG EC tablet Take 325 mg by mouth daily as needed for pain.   blood glucose meter kit and supplies Kit Dispense based on patient and insurance preference. Use up to four times daily as directed. (FOR ICD-9 250.00, 250.01).   cephALEXin 500 MG capsule Commonly known as: KEFLEX Take 1 capsule (500 mg total) by mouth every 6 (six) hours.   docusate sodium 100 MG capsule Commonly known as: COLACE Take 1 capsule (100 mg total) by mouth 2 (two) times daily.   hydrochlorothiazide 12.5 MG capsule Commonly known as: MICROZIDE Take 1 capsule (12.5 mg total) by mouth daily. Start taking on: June 16, 2019   insulin NPH Human 100 UNIT/ML injection Commonly known as: NOVOLIN N Inject 0.22 mLs (22 Units total) into the skin 2 (two) times daily at 8 am and 10 pm.   Insulin Syringe-Needle U-100 28G X 1/2" 1 ML Misc Use as directed    losartan 100 MG tablet Commonly known as: COZAAR Take 1 tablet (100 mg total) by mouth daily. Start taking on: June 16, 2019   meloxicam 15 MG tablet Commonly known as: MOBIC Take 15 mg by mouth daily as needed for muscle spasms.   metFORMIN 500 MG tablet Commonly known as: GLUCOPHAGE Take 1 tablet (500 mg total) by mouth 2 (two) times daily. Take 1 tablet in the morning and 2 tablets in the evening   oxyCODONE-acetaminophen 5-325 MG tablet Commonly known as: PERCOCET/ROXICET Take 1-2 tablets by mouth every 6 (six) hours as needed for severe pain. What changed:   when to take this  reasons to take this   polyethylene glycol 17 g packet Commonly known as: MIRALAX / GLYCOLAX Take 17 g by mouth daily as needed for mild constipation.      Follow-up Information    Leandrew Koyanagi, MD In 2 weeks.   Specialty: Orthopedic Surgery Why: For suture removal, For wound re-check Contact information: [place] [address] [phone]        [facility] Follow up.  Why: Follow up appointment June 24, 2019 at 0830 am.  Contact information: [address] [place] [address] [phone]         No Known Allergies  Consultations:  Orthopedics   Procedures/Studies: Mr Tibia Fibula Left W Wo Contrast  Result Date: 06/10/2019 CLINICAL DATA:  Continued leg pain and swelling. EXAM: MRI OF LOWER LEFT EXTREMITY WITHOUT AND WITH CONTRAST TECHNIQUE: Multiplanar, multisequence MR imaging of the left lower leg was performed both before and after administration of intravenous contrast. CONTRAST:  10 mL Gadavist COMPARISON:  06/02/2019 FINDINGS: Bones/Joint/Cartilage No focal marrow signal abnormality. No periosteal reaction or bone destruction. No fracture or dislocation. Normal alignment. No joint effusion. Ligaments Collateral ligaments are intact. Muscles and Tendons Muscle edema with enhancement and  perifascial edema in the peripheral medial most aspect of soleus muscle and flexor digitorum muscle located adjacent to a complex fluid collection. Remainder of the muscles demonstrate no focal abnormality. No areas of muscle necrosis. Soft tissue 1.2 x 6 x 7.2 cm complex fluid collection with peripheral enhancement and a few loculations in the subcutaneous fat of the medial aspect of the mid left lower leg most concerning for an abscess. Surrounding soft tissue edema primarily along the entire length of the medial aspect of the left lower leg concerning for cellulitis. No soft tissue mass. IMPRESSION: 1. No osteomyelitis of the left lower leg. 1.2 x 6 x 7.2 cm abscess in the subcutaneous fat of the medial aspect of the mid left lower leg with surrounding cellulitis. 2. Muscle edema and mild perifascial edema involving the peripheral medial most aspect of the soleus muscle and flexor digitorum muscle adjacent to the abscess. Differential considerations include reactive inflammatory edema secondary to adjacent inflammation versus mild early infectious myositis. Electronically Signed   By: Kathreen Devoid   On: 06/10/2019 08:04   Mr Tibia Fibula Left W Wo Contrast  Result Date: 06/02/2019 CLINICAL DATA:  Pt states since June 13, pt had charlie horse in left leg. Pt states that he has bruising. Pt states that has since gone away. Pt states he has a large bump on his left lower leg. Pt went to diabetic clinic, who told him it was an infection EXAM: MRI OF LOWER LEFT EXTREMITY WITHOUT AND WITH CONTRAST TECHNIQUE: Multiplanar, multisequence MR imaging of the left leg was performed both before and after administration of intravenous contrast. CONTRAST:  10 mL of Gadavist, intravenously. COMPARISON:  None. FINDINGS: Bones/Joint/Cartilage No abnormal bone marrow signal. No bone lesion. Joints are normally spaced and aligned. No joint effusion. Ligaments Knee not well assessed.  Visualized ankle ligaments appear intact.  Muscles and Tendons There is abnormal increased T2 signal within the lateral aspect of the flexor digitorum longus muscle and the adjacent medial margin of the soleus muscle with abnormal enhancement in the same locations. The adjacent subcutaneous soft tissues show increased T2 signal with abnormal enhancement noted more focally adjacent to the areas of abnormal signal and enhancement of the flexor digitorum longus muscle and medial psoas muscle. Remaining left leg muscles show normal signal with no other areas of abnormal enhancement. The posterior tibial tendon shows evidence of a longitudinal splint below the level of the medial malleolus, not well assessed on this exam. Remaining tendons are normal in signal and thickness. Soft tissues There is diffuse increased T2 signal throughout the subcutaneous soft tissues and extending along the fascia between the flexor digitorum longus muscle and the soleus muscle. A sliver of fluid tracks along the  medial margins of the soleus and adjacent medial gastrocnemius muscles. No MRI evidence of soft tissue air. No defined fluid collection to suggest an abscess. IMPRESSION: 1. There is abnormal signal and enhancement along the medial aspect of the flexor digitorum longus muscle and the adjacent medial soleus muscle, associated with overlying soft tissue edema and enhancement. Findings are consistent with both cellulitis and myositis. There is no defined abscess. Fluid tracking along the medial margins of the soleus and medial gastrocnemius muscles raises the possibility of fascitis, but there is no evidence soft tissue air, which argues against this diagnosis. 2. Possible longitudinal split of the distal posterior tibial tendon, presumed unrelated to the current acute findings. This would be best assessed with ankle MRI. 3. There is more diffuse subcutaneous edema surrounding the left leg, predominating anteromedially. Electronically Signed   By: Lajean Manes M.D.   On:  06/02/2019 18:25   Vas [place] Lower Extremity Venous (dvt) (only [facility])  Result Date: 06/02/2019  Lower Venous Study Indications: Pain, and Swelling.  Limitations: Body habitus and poor ultrasound/tissue interface. Performing Technologist: Maudry Mayhew MHA, RDMS, RVT, RDCS  Examination Guidelines: A complete evaluation includes B-mode imaging, spectral Doppler, color Doppler, and power Doppler as needed of all accessible portions of each vessel. Bilateral testing is considered an integral part of a complete examination. Limited examinations for reoccurring indications may be performed as noted.  +-----+---------------+---------+-----------+----------+--------------+ RIGHTCompressibilityPhasicitySpontaneityPropertiesSummary        +-----+---------------+---------+-----------+----------+--------------+ CFV                                               Not visualized +-----+---------------+---------+-----------+----------+--------------+   +---------+---------------+---------+-----------+----------+--------------+ LEFT     CompressibilityPhasicitySpontaneityPropertiesSummary        +---------+---------------+---------+-----------+----------+--------------+ CFV      Full           Yes      Yes                                 +---------+---------------+---------+-----------+----------+--------------+ SFJ      Full                                                        +---------+---------------+---------+-----------+----------+--------------+ FV Prox  Full                                                        +---------+---------------+---------+-----------+----------+--------------+ FV Mid   Full                                                        +---------+---------------+---------+-----------+----------+--------------+ FV DistalFull                                                         +---------+---------------+---------+-----------+----------+--------------+  PFV      Full                                                        +---------+---------------+---------+-----------+----------+--------------+ POP      Full           Yes      Yes                                 +---------+---------------+---------+-----------+----------+--------------+ PTV      Full                    Yes                                 +---------+---------------+---------+-----------+----------+--------------+ PERO                                                  Not visualized +---------+---------------+---------+-----------+----------+--------------+ GSV      None                                         Acute          +---------+---------------+---------+-----------+----------+--------------+     Summary: Left: Findings consistent with acute superficial vein thrombosis involving the left great saphenous vein. There is no evidence of deep vein thrombosis in the lower extremity. No cystic structure found in the popliteal fossa.  *See table(s) above for measurements and observations. Electronically signed by Deitra Mayo MD on 06/02/2019 at 5:00:54 PM.    Final        Subjective: Feeling better.  Back pain is improving.  Discharge Exam: Vitals:   06/13/19 2140 06/14/19 1557 06/15/19 0525 06/15/19 1435  BP: (!) 139/97 134/86 127/89 121/82  Pulse: 94 72 68 74  Resp: '18 15  16  ' Temp: 98.1 F (36.7 C) 97.9 F (36.6 C) 98.4 F (36.9 C) 98.5 F (36.9 C)  TempSrc: Oral Oral Oral Oral  SpO2: 100% 97% 99% 100%  Weight:      Height:        General: Pt is alert, awake, not in acute distress Cardiovascular: RRR, S1/S2 +, no rubs, no gallops Respiratory: CTA bilaterally, no wheezing, no rhonchi Abdominal: Soft, NT, ND, bowel sounds + Extremities: Left lower extremity wrapped in dressings    The results of significant diagnostics from this hospitalization  (including imaging, microbiology, ancillary and laboratory) are listed below for reference.     Microbiology: Recent Results (from the past 240 hour(s))  Culture, blood (routine x 2)     Status: None   Collection Time: 06/07/19  7:26 PM   Specimen: BLOOD RIGHT FOREARM  Result Value Ref Range Status   Specimen Description   Final    BLOOD RIGHT FOREARM Performed at [facility] [address], [place], [place] [address]    Special Requests   Final    BOTTLES DRAWN AEROBIC AND ANAEROBIC Blood Culture adequate volume Performed at [facility]  [place] [address], [place], [place] [address]    Culture   Final    NO GROWTH 5 DAYS Performed at [facility], [place] [address] [place], [place] [address]    Report Status 06/13/2019 FINAL  Final  Culture, blood (routine x 2)     Status: None   Collection Time: 06/07/19  7:26 PM   Specimen: BLOOD  Result Value Ref Range Status   Specimen Description   Final    BLOOD LEFT ANTECUBITAL Performed at [facility], [place] [address] [place] [address], [place], [place] [address]    Special Requests   Final    BOTTLES DRAWN AEROBIC AND ANAEROBIC Blood Culture adequate volume Performed at [facility] [address] [place],  [place] [address]    Culture   Final    NO GROWTH 5 DAYS Performed at [facility], [place] [address], [place], [place] [address]    Report Status 06/13/2019 FINAL  Final  SARS Coronavirus 2 (CEPHEID - Performed in [facility] hospital lab), Hosp Order     Status: None   Collection Time: 06/07/19  9:00 PM   Specimen: Nasopharyngeal Swab  Result Value Ref Range Status   SARS Coronavirus 2 NEGATIVE NEGATIVE Final    Comment: (NOTE) If result is NEGATIVE SARS-CoV-2 target nucleic acids are NOT DETECTED. The SARS-CoV-2 RNA is generally detectable in upper and lower  respiratory specimens during the acute phase of infection. The lowest  concentration of SARS-CoV-2 viral  copies this assay can detect is 250  copies / mL. A negative result does not preclude SARS-CoV-2 infection  and should not be used as the sole basis for treatment or other  patient management decisions.  A negative result may occur with  improper specimen collection / handling, submission of specimen other  than nasopharyngeal swab, presence of viral mutation(s) within the  areas targeted by this assay, and inadequate number of viral copies  (<250 copies / mL). A negative result must be combined with clinical  observations, patient history, and epidemiological information. If result is POSITIVE SARS-CoV-2 target nucleic acids are DETECTED. The SARS-CoV-2 RNA is generally detectable in upper and lower  respiratory specimens dur ing the acute phase of infection.  Positive  results are indicative of active infection with SARS-CoV-2.  Clinical  correlation with patient history and other diagnostic information is  necessary to determine patient infection status.  Positive results do  not rule out bacterial infection or co-infection with other viruses. If result is PRESUMPTIVE POSTIVE SARS-CoV-2 nucleic acids MAY BE PRESENT.   A presumptive positive result was obtained on the submitted specimen  and confirmed on repeat testing.  While 2019 novel coronavirus  (SARS-CoV-2) nucleic acids may be present in the submitted sample  additional confirmatory testing may be necessary for epidemiological  and / or clinical management purposes  to differentiate between  SARS-CoV-2 and other [place]ovirus currently known to infect humans.  If clinically indicated additional testing with an alternate test  methodology [phone]) is advised. The SARS-CoV-2 RNA is generally  detectable in upper and lower respiratory sp ecimens during the acute  phase of infection. The expected result is Negative. Fact Sheet for Patients:  StrictlyIdeas.no Fact Sheet for Healthcare  Providers: BankingDealers.co.za This test is not yet approved or cleared by the [place] FDA and has been authorized for detection and/or diagnosis of SARS-CoV-2 by FDA under an Emergency Use Authorization (EUA).  This EUA will remain in effect (meaning  this test can be used) for the duration of the COVID-19 declaration under Section 564(b)(1) of the Act, 21 U.S.C. section 360bbb-3(b)(1), unless the authorization is terminated or revoked sooner. Performed at [facility], [place] [address], [place], [place] [address]   Surgical pcr screen     Status: None   Collection Time: 06/10/19  1:04 AM   Specimen: Nasal Mucosa; Nasal Swab  Result Value Ref Range Status   MRSA, PCR NEGATIVE NEGATIVE Final   Staphylococcus aureus NEGATIVE NEGATIVE Final    Comment: (NOTE) The Xpert SA Assay (FDA approved for NASAL specimens in patients 97 years of age and older), is one component of a comprehensive surveillance program. It is not intended to diagnose infection nor to guide or monitor treatment. Performed at [facility], [place] [address] [place], [place] [address]   Aerobic/Anaerobic Culture (surgical/deep wound)     Status: None   Collection Time: 06/10/19  5:50 PM   Specimen: Soft Tissue, Other  Result Value Ref Range Status   Specimen Description ABSCESS LEFT LEG  Final   Special Requests SWAB  Final   Gram Stain   Final    ABUNDANT WBC PRESENT, PREDOMINANTLY PMN MODERATE GRAM POSITIVE COCCI    Culture   Final    MODERATE STAPHYLOCOCCUS AUREUS NO ANAEROBES ISOLATED Performed at [facility], [place] [address], [place], [place] [address]    Report Status 06/15/2019 FINAL  Final   Organism ID, Bacteria STAPHYLOCOCCUS AUREUS  Final      Susceptibility   Staphylococcus aureus - MIC*    CIPROFLOXACIN <=0.5 SENSITIVE Sensitive     ERYTHROMYCIN <=0.25 SENSITIVE Sensitive     GENTAMICIN <=0.5 SENSITIVE Sensitive     OXACILLIN <=0.25  SENSITIVE Sensitive     TETRACYCLINE <=1 SENSITIVE Sensitive     VANCOMYCIN <=0.5 SENSITIVE Sensitive     TRIMETH/SULFA <=10 SENSITIVE Sensitive     CLINDAMYCIN <=0.25 SENSITIVE Sensitive     RIFAMPIN <=0.5 SENSITIVE Sensitive     Inducible Clindamycin NEGATIVE Sensitive     * MODERATE STAPHYLOCOCCUS AUREUS     Labs: BNP (last 3 results) No results for input(s): BNP in the last 8760 hours. Basic Metabolic Panel: Recent Labs  Lab 06/09/19 0341 06/10/19 2357 06/12/19 0132 06/13/19 0353 06/14/19 0320  NA 135 131* 135  --   --   K 3.7 4.2 3.7  --   --   CL 99 95* 100  --   --   CO2 22 21* 24  --   --   GLUCOSE 273* 419* 224*  --   --   BUN 15 19 25*  --   --   CREATININE 0.84 1.22 0.97 1.03 0.83  CALCIUM 9.3 9.3 9.2  --   --   MG 1.6* 1.5* 1.7  --   --   PHOS 4.1  --   --   --   --    Liver Function Tests: Recent Labs  Lab 06/09/19 0341 06/10/19 2357 06/12/19 0132  AST  --  32 27  ALT  --  42 34  ALKPHOS  --  87 65  BILITOT  --  0.9 0.6  PROT  --  7.3 6.2*  ALBUMIN 3.3* 3.3* 3.0*   No results for input(s): LIPASE, AMYLASE in the last 168 hours. No results for input(s): AMMONIA in the last 168 hours. CBC: Recent Labs  Lab 06/09/19 0341 06/12/19 0132  WBC 11.2* 10.7*  NEUTROABS 6.8  --  HGB 15.5 14.6  HCT 46.9 43.9  MCV 86.1 85.1  PLT 288 342   Cardiac Enzymes: No results for input(s): CKTOTAL, CKMB, CKMBINDEX, TROPONINI in the last 168 hours. BNP: Invalid input(s): POCBNP CBG: Recent Labs  Lab 06/14/19 1139 06/14/19 1531 06/14/19 2130 06/15/19 0755 06/15/19 1150  GLUCAP 271* 132* 148* 107* 204*   D-Dimer No results for input(s): DDIMER in the last 72 hours. Hgb A1c No results for input(s): HGBA1C in the last 72 hours. Lipid Profile No results for input(s): CHOL, HDL, LDLCALC, TRIG, CHOLHDL, LDLDIRECT in the last 72 hours. Thyroid function studies No results for input(s): TSH, T4TOTAL, T3FREE, THYROIDAB in the last 72 hours.  Invalid  input(s): FREET3 Anemia work up No results for input(s): VITAMINB12, FOLATE, FERRITIN, TIBC, IRON, RETICCTPCT in the last 72 hours. Urinalysis    Component Value Date/Time   COLORURINE AMBER BIOCHEMICALS MAY BE AFFECTED BY COLOR (A) 03/09/2009 2214   APPEARANCEUR CLOUDY (A) 03/09/2009 2214   LABSPEC 1.020 03/09/2009 2214   PHURINE 7.0 03/09/2009 2214   GLUCOSEU NEGATIVE 03/09/2009 2214   HGBUR LARGE (A) 03/09/2009 2214   BILIRUBINUR NEGATIVE 03/09/2009 2214   KETONESUR 15 (A) 03/09/2009 2214   PROTEINUR 30 (A) 03/09/2009 2214   UROBILINOGEN 1.0 03/09/2009 2214   NITRITE NEGATIVE 03/09/2009 2214   LEUKOCYTESUR SMALL (A) 03/09/2009 2214   Sepsis Labs Invalid input(s): PROCALCITONIN,  WBC,  LACTICIDVEN Microbiology Recent Results (from the past 240 hour(s))  Culture, blood (routine x 2)     Status: None   Collection Time: 06/07/19  7:26 PM   Specimen: BLOOD RIGHT FOREARM  Result Value Ref Range Status   Specimen Description   Final    BLOOD RIGHT FOREARM Performed at [facility], [place] [address], [place], [place] [address]    Special Requests   Final    BOTTLES DRAWN AEROBIC AND ANAEROBIC Blood Culture adequate volume Performed at [place] [address], [place], [place] [address]    Culture   Final    NO GROWTH 5 DAYS Performed at [facility], [place] [address] [place], [place] [address]    Report Status 06/13/2019 FINAL  Final  Culture, blood (routine x 2)     Status: None   Collection Time: 06/07/19  7:26 PM   Specimen: BLOOD  Result Value Ref Range Status   Specimen Description   Final    BLOOD LEFT ANTECUBITAL Performed at [facility], [place] [address], [place], [place] [address]    Special Requests   Final    BOTTLES DRAWN AEROBIC AND ANAEROBIC Blood Culture adequate volume Performed at [place] [address] [place], [place] [address]    Culture   Final    NO GROWTH 5  DAYS Performed at [facility], [place] [address], [place], [place] [address]    Report Status 06/13/2019 FINAL  Final  SARS Coronavirus 2 (CEPHEID - Performed in [facility]), Hosp Order     Status: None   Collection Time: 06/07/19  9:00 PM   Specimen: Nasopharyngeal Swab  Result Value Ref Range Status   SARS Coronavirus 2 NEGATIVE NEGATIVE Final    Comment: (NOTE) If result is NEGATIVE SARS-CoV-2 target nucleic acids are NOT DETECTED. The SARS-CoV-2 RNA is generally detectable in upper and lower  respiratory specimens during the acute phase of infection. The lowest  concentration of SARS-CoV-2 viral copies this assay can detect is 250  copies /  mL. A negative result does not preclude SARS-CoV-2 infection  and should not be used as the sole basis for treatment or other  patient management decisions.  A negative result may occur with  improper specimen collection / handling, submission of specimen other  than nasopharyngeal swab, presence of viral mutation(s) within the  areas targeted by this assay, and inadequate number of viral copies  (<250 copies / mL). A negative result must be combined with clinical  observations, patient history, and epidemiological information. If result is POSITIVE SARS-CoV-2 target nucleic acids are DETECTED. The SARS-CoV-2 RNA is generally detectable in upper and lower  respiratory specimens dur ing the acute phase of infection.  Positive  results are indicative of active infection with SARS-CoV-2.  Clinical  correlation with patient history and other diagnostic information is  necessary to determine patient infection status.  Positive results do  not rule out bacterial infection or co-infection with other viruses. If result is PRESUMPTIVE POSTIVE SARS-CoV-2 nucleic acids MAY BE PRESENT.   A presumptive positive result was obtained on the submitted specimen  and confirmed on repeat testing.  While 2019 novel coronavirus  (SARS-CoV-2)  nucleic acids may be present in the submitted sample  additional confirmatory testing may be necessary for epidemiological  and / or clinical management purposes  to differentiate between  SARS-CoV-2 and other Sarbecovirus currently known to infect humans.  If clinically indicated additional testing with an alternate test  methodology [phone]) is advised. The SARS-CoV-2 RNA is generally  detectable in upper and lower respiratory sp ecimens during the acute  phase of infection. The expected result is Negative. Fact Sheet for Patients:  StrictlyIdeas.no Fact Sheet for Healthcare Providers: BankingDealers.co.za This test is not yet approved or cleared by the [place] FDA and has been authorized for detection and/or diagnosis of SARS-CoV-2 by FDA under an Emergency Use Authorization (EUA).  This EUA will remain in effect (meaning this test can be used) for the duration of the COVID-19 declaration under Section 564(b)(1) of the Act, 21 U.S.C. section 360bbb-3(b)(1), unless the authorization is terminated or revoked sooner. Performed at [facility], [place] [address], [place], [place] [address]   Surgical pcr screen     Status: None   Collection Time: 06/10/19  1:04 AM   Specimen: Nasal Mucosa; Nasal Swab  Result Value Ref Range Status   MRSA, PCR NEGATIVE NEGATIVE Final   Staphylococcus aureus NEGATIVE NEGATIVE Final    Comment: (NOTE) The Xpert SA Assay (FDA approved for NASAL specimens in patients 7 years of age and older), is one component of a comprehensive surveillance program. It is not intended to diagnose infection nor to guide or monitor treatment. Performed at [facility], [place] [address], [place], [place] [address]   Aerobic/Anaerobic Culture (surgical/deep wound)     Status: None   Collection Time: 06/10/19  5:50 PM   Specimen: Soft Tissue, Other  Result Value Ref Range Status   Specimen  Description ABSCESS LEFT LEG  Final   Special Requests SWAB  Final   Gram Stain   Final    ABUNDANT WBC PRESENT, PREDOMINANTLY PMN MODERATE GRAM POSITIVE COCCI    Culture   Final    MODERATE STAPHYLOCOCCUS AUREUS NO ANAEROBES ISOLATED Performed at [facility], [place] [address], [place], [place] [address]    Report Status 06/15/2019 FINAL  Final   Organism ID, Bacteria STAPHYLOCOCCUS AUREUS  Final      Susceptibility   Staphylococcus aureus -  MIC*    CIPROFLOXACIN <=0.5 SENSITIVE Sensitive     ERYTHROMYCIN <=0.25 SENSITIVE Sensitive     GENTAMICIN <=0.5 SENSITIVE Sensitive     OXACILLIN <=0.25 SENSITIVE Sensitive     TETRACYCLINE <=1 SENSITIVE Sensitive     VANCOMYCIN <=0.5 SENSITIVE Sensitive     TRIMETH/SULFA <=10 SENSITIVE Sensitive     CLINDAMYCIN <=0.25 SENSITIVE Sensitive     RIFAMPIN <=0.5 SENSITIVE Sensitive     Inducible Clindamycin NEGATIVE Sensitive     * MODERATE STAPHYLOCOCCUS AUREUS     Time coordinating discharge: 43mns  SIGNED:   JKathie Dike MD  [facility] 06/15/2019, 6:51 PM   If 7PM-7AM, please contact night-coverage [email or web address]

## 2019-06-15 NOTE — Plan of Care (Signed)
Pt for discharge going home with home health [facility], alert and oriented, discontinued peripheral IV line, dressing changed, given pain med prior to dressing change, health teachings given, next appointment, due med explained and understood, also health teachings on s/s of hypo/hyperglycemia, and how to give insulin and know the coverage, given all his personal belongings, waiting for his girlfriend to pick him up.

## 2019-06-15 NOTE — Progress Notes (Signed)
Midline discharge by RN Verdis Frederickson IV team gave education on the removal

## 2019-06-15 NOTE — Progress Notes (Signed)
Pt got his medicine from pharmacy and walker, waiting for his mother to pick him up.

## 2019-06-17 ENCOUNTER — Telehealth: Payer: Self-pay | Admitting: Radiology

## 2019-06-17 NOTE — Telephone Encounter (Signed)
Allyson Sabal from [facility] called to advise that they would be able to see patient starting on 06/19/2019

## 2019-06-22 ENCOUNTER — Telehealth: Payer: Self-pay

## 2019-06-22 NOTE — Telephone Encounter (Signed)
Willis Modena with [facility] called needing verbal orders for Wound management 1wk x 9 weeks Verbal order given.

## 2019-06-22 NOTE — Telephone Encounter (Signed)
Yes, thanks

## 2019-06-24 ENCOUNTER — Encounter: Payer: Self-pay | Admitting: Critical Care Medicine

## 2019-06-24 ENCOUNTER — Ambulatory Visit: Payer: Self-pay | Attending: Critical Care Medicine | Admitting: Critical Care Medicine

## 2019-06-24 ENCOUNTER — Other Ambulatory Visit: Payer: Self-pay

## 2019-06-24 VITALS — BP 145/98 | HR 98 | Temp 98.2°F

## 2019-06-24 DIAGNOSIS — A4101 Sepsis due to Methicillin susceptible Staphylococcus aureus: Secondary | ICD-10-CM

## 2019-06-24 DIAGNOSIS — E1169 Type 2 diabetes mellitus with other specified complication: Secondary | ICD-10-CM

## 2019-06-24 DIAGNOSIS — L02416 Cutaneous abscess of left lower limb: Secondary | ICD-10-CM

## 2019-06-24 DIAGNOSIS — E785 Hyperlipidemia, unspecified: Secondary | ICD-10-CM

## 2019-06-24 DIAGNOSIS — I10 Essential (primary) hypertension: Secondary | ICD-10-CM

## 2019-06-24 DIAGNOSIS — I82812 Embolism and thrombosis of superficial veins of left lower extremities: Secondary | ICD-10-CM

## 2019-06-24 DIAGNOSIS — E1165 Type 2 diabetes mellitus with hyperglycemia: Secondary | ICD-10-CM

## 2019-06-24 DIAGNOSIS — Z794 Long term (current) use of insulin: Secondary | ICD-10-CM

## 2019-06-24 DIAGNOSIS — L03116 Cellulitis of left lower limb: Secondary | ICD-10-CM

## 2019-06-24 HISTORY — DX: Embolism and thrombosis of superficial veins of left lower extremity: I82.812

## 2019-06-24 MED ORDER — ATORVASTATIN CALCIUM 20 MG PO TABS: 20.0000 mg | ORAL_TABLET | Freq: Every day | ORAL | 3 refills | Status: DC

## 2019-06-24 MED ORDER — AMLODIPINE BESYLATE 10 MG PO TABS: 10.0000 mg | ORAL_TABLET | Freq: Every day | ORAL | 3 refills | Status: DC

## 2019-06-24 MED ORDER — HYDROCHLOROTHIAZIDE 12.5 MG PO CAPS: 12.5000 mg | ORAL_CAPSULE | Freq: Every day | ORAL | 0 refills | Status: DC

## 2019-06-24 MED ORDER — LOSARTAN POTASSIUM 100 MG PO TABS: 100.0000 mg | ORAL_TABLET | Freq: Every day | ORAL | 0 refills | Status: DC

## 2019-06-24 MED ORDER — OXYCODONE-ACETAMINOPHEN 5-325 MG PO TABS: 1.0000 | ORAL_TABLET | Freq: Four times a day (QID) | ORAL | 0 refills | Status: AC | PRN

## 2019-06-24 MED ORDER — METFORMIN HCL 500 MG PO TABS: 500.0000 mg | ORAL_TABLET | Freq: Two times a day (BID) | ORAL | 0 refills | Status: DC

## 2019-06-24 MED ORDER — LANTUS SOLOSTAR 100 UNIT/ML ~~LOC~~ SOPN: 30.0000 [IU] | PEN_INJECTOR | Freq: Every day | SUBCUTANEOUS | 6 refills | Status: DC

## 2019-06-24 MED ORDER — TRULICITY 0.75 MG/0.5ML ~~LOC~~ SOAJ: 0.7500 ug | SUBCUTANEOUS | 3 refills | Status: DC

## 2019-06-24 MED ORDER — INSULIN PEN NEEDLE 29G X 5MM MISC: 0 refills | Status: DC

## 2019-06-24 MED FILL — TRUEPLUS 5-BEVEL PEN NEEDLE: 31G X 5 MM | 25 days supply | Qty: 100 | Fill #0

## 2019-06-24 MED FILL — ?ATORVASTATIN 20 MG TABLET: 20 | 30 days supply | Qty: 30 | Fill #0

## 2019-06-24 MED FILL — TRULICITY 0.75 MG/0.5 ML PE: 0.75 | 30 days supply | Qty: 2 | Fill #0

## 2019-06-24 MED FILL — !LANTUS SOLOSTAR 100UNITS/M: 100 | 30 days supply | Qty: 9 | Fill #0

## 2019-06-24 MED FILL — ?AMLODIPINE BESYLATE 10 MG: 10 | 30 days supply | Qty: 30 | Fill #0

## 2019-06-24 NOTE — Patient Instructions (Addendum)
Stop NPH  Continue metformin twice daily  Begin Lantus 30 units at bedtime daily  Begin Trulicity 0.5 mL was injected once weekly  Begin amlodipine 10 mg daily for blood pressure  Begin atorvastatin 1 daily for cholesterol  Continue losartan and hydrochlorthiazide daily  An appointment with orthopedics has been made please keep that appointment  Labs today to check your chemistries and blood counts and lipids will be made  Return to see Dr. Delford FieldWright 3 weeks  An appt with our clinical pharmacy provider will be made  Follow diabetic diet as outlined below   Diabetes Mellitus and Nutrition, Adult When you have diabetes (diabetes mellitus), it is very important to have healthy eating habits because your blood sugar (glucose) levels are greatly affected by what you eat and drink. Eating healthy foods in the appropriate amounts, at about the same times every day, can help you:  Control your blood glucose.  Lower your risk of heart disease.  Improve your blood pressure.  Reach or maintain a healthy weight. Every person with diabetes is different, and each person has different needs for a meal plan. Your health care provider may recommend that you work with a diet and nutrition specialist (dietitian) to make a meal plan that is best for you. Your meal plan may vary depending on factors such as:  The calories you need.  The medicines you take.  Your weight.  Your blood glucose, blood pressure, and cholesterol levels.  Your activity level.  Other health conditions you have, such as heart or kidney disease. How do carbohydrates affect me? Carbohydrates, also called carbs, affect your blood glucose level more than any other type of food. Eating carbs naturally raises the amount of glucose in your blood. Carb counting is a method for keeping track of how many carbs you eat. Counting carbs is important to keep your blood glucose at a healthy level, especially if you use insulin or  take certain oral diabetes medicines. It is important to know how many carbs you can safely have in each meal. This is different for every person. Your dietitian can help you calculate how many carbs you should have at each meal and for each snack. Foods that contain carbs include:  Bread, cereal, rice, pasta, and crackers.  Potatoes and corn.  Peas, beans, and lentils.  Milk and yogurt.  Fruit and juice.  Desserts, such as cakes, cookies, ice cream, and candy. How does alcohol affect me? Alcohol can cause a sudden decrease in blood glucose (hypoglycemia), especially if you use insulin or take certain oral diabetes medicines. Hypoglycemia can be a life-threatening condition. Symptoms of hypoglycemia (sleepiness, dizziness, and confusion) are similar to symptoms of having too much alcohol. If your health care provider says that alcohol is safe for you, follow these guidelines:  Limit alcohol intake to no more than 1 drink per day for nonpregnant women and 2 drinks per day for men. One drink equals 12 oz of beer, 5 oz of wine, or 1 oz of hard liquor.  Do not drink on an empty stomach.  Keep yourself hydrated with water, diet soda, or unsweetened iced tea.  Keep in mind that regular soda, juice, and other mixers may contain a lot of sugar and must be counted as carbs. What are tips for following this plan?  Reading food labels  Start by checking the serving size on the "Nutrition Facts" label of packaged foods and drinks. The amount of calories, carbs, fats, and other nutrients listed  on the label is based on one serving of the item. Many items contain more than one serving per package.  Check the total grams (g) of carbs in one serving. You can calculate the number of servings of carbs in one serving by dividing the total carbs by 15. For example, if a food has 30 g of total carbs, it would be equal to 2 servings of carbs.  Check the number of grams (g) of saturated and trans fats in  one serving. Choose foods that have low or no amount of these fats.  Check the number of milligrams (mg) of salt (sodium) in one serving. Most people should limit total sodium intake to less than 2,300 mg per day.  Always check the nutrition information of foods labeled as "low-fat" or "nonfat". These foods may be higher in added sugar or refined carbs and should be avoided.  Talk to your dietitian to identify your daily goals for nutrients listed on the label. Shopping  Avoid buying canned, premade, or processed foods. These foods tend to be high in fat, sodium, and added sugar.  Shop around the outside edge of the grocery store. This includes fresh fruits and vegetables, bulk grains, fresh meats, and fresh dairy. Cooking  Use low-heat cooking methods, such as baking, instead of high-heat cooking methods like deep frying.  Cook using healthy oils, such as olive, canola, or sunflower oil.  Avoid cooking with butter, cream, or high-fat meats. Meal planning  Eat meals and snacks regularly, preferably at the same times every day. Avoid going long periods of time without eating.  Eat foods high in fiber, such as fresh fruits, vegetables, beans, and whole grains. Talk to your dietitian about how many servings of carbs you can eat at each meal.  Eat 4-6 ounces (oz) of lean protein each day, such as lean meat, chicken, fish, eggs, or tofu. One oz of lean protein is equal to: ? 1 oz of meat, chicken, or fish. ? 1 egg. ?  cup of tofu.  Eat some foods each day that contain healthy fats, such as avocado, nuts, seeds, and fish. Lifestyle  Check your blood glucose regularly.  Exercise regularly as told by your health care provider. This may include: ? 150 minutes of moderate-intensity or vigorous-intensity exercise each week. This could be brisk walking, biking, or water aerobics. ? Stretching and doing strength exercises, such as yoga or weightlifting, at least 2 times a week.  Take  medicines as told by your health care provider.  Do not use any products that contain nicotine or tobacco, such as cigarettes and e-cigarettes. If you need help quitting, ask your health care provider.  Work with a Social worker or diabetes educator to identify strategies to manage stress and any emotional and social challenges. Questions to ask a health care provider  Do I need to meet with a diabetes educator?  Do I need to meet with a dietitian?  What number can I call if I have questions?  When are the best times to check my blood glucose? Where to find more information:  American Diabetes Association: diabetes.org  Academy of Nutrition and Dietetics: www.eatright.[employer] of Diabetes and Digestive and Kidney Diseases (NIH): [employer] Summary  A healthy meal plan will help you control your blood glucose and maintain a healthy lifestyle.  Working with a diet and nutrition specialist (dietitian) can help you make a meal plan that is best for you.  Keep in mind that carbohydrates (carbs) and  alcohol have immediate effects on your blood glucose levels. It is important to count carbs and to use alcohol carefully. This information is not intended to replace advice given to you by your health care provider. Make sure you discuss any questions you have with your health care provider. Document Released: 08/09/2005 Document Revised: 10/25/2017 Document Reviewed: 12/17/2016 Elsevier Patient Education  2020 [employer].

## 2019-06-24 NOTE — Assessment & Plan Note (Signed)
Hypertension not yet well controlled  We will add amlodipine 10 mg daily and continue losartan and hydrochlorothiazide as prescribed

## 2019-06-24 NOTE — Assessment & Plan Note (Signed)
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

## 2019-06-24 NOTE — Assessment & Plan Note (Signed)
Diabetes type 2 with use of NPH at this time and metformin Hemoglobin A1c was greater than 11 Lack of access to medications in the past due to insurance barriers and patient adherence  Plan now will be to prescribe Lantus 30 units nightly and begin Trulicity 5.68 mcg weekly Patient will continue metformin 500 mg twice daily  We will schedule a follow-up visit with our clinical pharmacist We will obtain lipid profile complete metabolic panel today in the office  Will obtain patient assistance for these medications  Will discontinue NPH

## 2019-06-24 NOTE — Assessment & Plan Note (Signed)
Sepsis has now resolved

## 2019-06-24 NOTE — Assessment & Plan Note (Addendum)
Cellulitis and abscess of the left leg improving  We will follow-up with orthopedics and if arrange an appointment July 30  The patient is finish current course of antibiotics

## 2019-06-24 NOTE — Progress Notes (Signed)
Subjective:    Patient ID: Anthony Hubbard, male    DOB: 07-09-1986, 33 y.o.   MRN: [id number]  32 y.o.M with type 2 diabetes and hypertension and recent admission for left lower extremity cellulitis and abscess with need for drainage per orthopedics  The patient is seen today in post hospital follow-up and is desiring primary care here as well.  The patient had been previously seen in the [facility] for primary care but lost his insurance and cannot go back to that site  For diabetes the patient had been on Ozempic and also long-acting insulin along with metformin Note the patient is now on NPH status post discharge and also is maintaining hydrochlorthiazide and losartan for hypertension  Below is the discharge summary  Admit date: 06/07/2019 Discharge date: 06/15/2019  Admitted From: Home Disposition: Home  Recommendations for Outpatient Follow-up:  1. Follow up with PCP in 1-2 weeks 2. Please obtain BMP/CBC in one week 3. Follow-up with orthopedics in 2 weeks 4. Follow-up at [facility] wellness clinic scheduled for 7/29  Home Health: Home health RN, PT Equipment/Devices: Walker  Discharge Condition: Stable CODE STATUS: Full code Diet recommendation: Heart healthy, carb modified  Brief/Interim Summary: Anthony Hubbard a 33 y.o.Mwith hx IDDM, HTN not currently treatedwho presents with worsening left leg swelling, redness and warmth, failing Augmentin.  The patient was in his usual state of health until mid June, he developed a spontaneous red painful swelling on the front of his left shin. This persisted, gradually getting worse until about 1 week ago, when he developed a much larger red painful swelling just medial to it. He went to urgent care, where he was started on Augmentin. He then came here to the emergency room 7/11, had an MR of the left lower extremity as well as ultrasound of the left lower extremity that showed a superficial venous thrombosis  by (US)andmild cellulitis and myositis of the left calf without abscess.  He was benign in appearance and was discharged to continue with Augmentin and follow up with orthopedics, which he was not able to do due to having no insurance.  Now in the last three days, despite taking Augmentin BID as prescribed for last 6 days, he has worsening severe left calf pain, worse with plantarflexion, associated with warmth and swelling. He has had no firmness of the calf as a whole, and no numbness or loss of function of toes. Intermittent fever, no respiratory symptoms.  In the ER, he was tachycardic to 967E, BP 938B systolic, WBC 01B. Lactate normal. CK normal. He was given IVF, vancomycin and Zosyn. His case was discussed with orthopedics, who will evaluate patient in AM.  Discharge Diagnoses:  Principal Problem:   Cellulitis and abscess of left leg Active Problems:   Sepsis ([place])   Type 2 diabetes mellitus with hyperglycemia, with long-term current use of insulin (HCC)   Essential hypertension   Hypertensive urgency  1. Sepsis secondary to left leg cellulitis and abscess.  Initially treated with vancomycin, ceftriaxone and Flagyl.  MRI showed lower extremity abscess.  Seen by orthopedics underwent irrigation debridement.  Intraoperative cultures positive for MSSA.  He was treated with appropriate antibiotics and discharged on Keflex.  He will continue wound care dressings twice daily.  He has been set up with home health services.  He will follow-up with orthopedics in the next 2 weeks. 2. Diabetes.  Previously on metformin, Ozempic and [place].  Unable to take this medication since he is lost his health  insurance.  Started on NPH with fair control of blood sugars.  This is something he should be able to afford post discharge.  A1c of 11.9.  Blood sugars improved after starting NPH. 3. Hypertension.  Started on hydrochlorothiazide and losartan.  Blood pressure was significantly improved at  discharge.  Since discharge the patient has had visiting nurse and visiting physical therapist with dressing changes twice daily.  The patient showed me a photograph of the wound and it is clearly improving.  The patient does not have an established appointment with orthopedics and we therefore will call that office to establish an appointment  Patient is monitoring blood sugars and notes his blood sugar fastings are in the 1 10-1 20 range and will be 1 80-1 90 postprandial the patient is also following a diabetic diet  The patient is run out of his pain medicine needing refill The patient has a walker and is having difficulty putting pressure on his left leg  Patient did have deep venous thrombosis which was superficial in the calf this may require further follow-up   Past Medical History:  Diagnosis Date   Diabetes mellitus without complication ([place])    type 2    Hypertension    Hypertensive urgency 06/07/2019   Phimosis      Family History  Problem Relation Age of Onset   Diabetes Father    Diabetes Paternal Uncle      Social History   Socioeconomic History   Marital status: Married    Spouse name: Not on file   Number of children: Not on file   Years of education: Not on file   Highest education level: Not on file  Occupational History   Not on file  Social Needs   Financial resource strain: Not on file   Food insecurity    Worry: Not on file    Inability: Not on file   Transportation needs    Medical: Not on file    Non-medical: Not on file  Tobacco Use   Smoking status: Never Smoker   Smokeless tobacco: Never Used  Substance and Sexual Activity   Alcohol use: No   Drug use: Yes    Types: Marijuana   Sexual activity: Not on file  Lifestyle   Physical activity    Days per week: Not on file    Minutes per session: Not on file   Stress: Not on file  Relationships   Social connections    Talks on phone: Not on file    Gets together:  Not on file    Attends religious service: Not on file    Active member of club or organization: Not on file    Attends meetings of clubs or organizations: Not on file    Relationship status: Not on file   Intimate partner violence    Fear of current or ex partner: Not on file    Emotionally abused: Not on file    Physically abused: Not on file    Forced sexual activity: Not on file  Other Topics Concern   Not on file  Social History Narrative   Not on file     No Known Allergies   Outpatient Medications Prior to Visit  Medication Sig Dispense Refill   aspirin 325 MG EC tablet Take 325 mg by mouth daily as needed for pain.     blood glucose meter kit and supplies KIT Dispense based on patient and insurance preference. Use up to four times  daily as directed. (FOR ICD-9 250.00, 250.01). 1 each 0   cephALEXin (KEFLEX) 500 MG capsule Take 1 capsule (500 mg total) by mouth every 6 (six) hours. 20 capsule 0   docusate sodium (COLACE) 100 MG capsule Take 1 capsule (100 mg total) by mouth 2 (two) times daily. 10 capsule 0   meloxicam (MOBIC) 15 MG tablet Take 15 mg by mouth daily as needed for muscle spasms.     polyethylene glycol (MIRALAX / GLYCOLAX) 17 g packet Take 17 g by mouth daily as needed for mild constipation. 14 each 0   hydrochlorothiazide (MICROZIDE) 12.5 MG capsule Take 1 capsule (12.5 mg total) by mouth daily. 30 capsule 0   insulin NPH Human (NOVOLIN N) 100 UNIT/ML injection Inject 0.22 mLs (22 Units total) into the skin 2 (two) times daily at 8 am and 10 pm. 10 mL 11   Insulin Syringe-Needle U-100 28G X 1/2" 1 ML MISC Use as directed 60 each 0   losartan (COZAAR) 100 MG tablet Take 1 tablet (100 mg total) by mouth daily. 30 tablet 0   metFORMIN (GLUCOPHAGE) 500 MG tablet Take 1 tablet (500 mg total) by mouth 2 (two) times daily. Take 1 tablet in the morning and 2 tablets in the evening 90 tablet 0   oxyCODONE-acetaminophen (PERCOCET/ROXICET) 5-325 MG tablet Take  1-2 tablets by mouth every 6 (six) hours as needed for severe pain. 30 tablet 0   No facility-administered medications prior to visit.     Review of Systems Constitutional:   No  weight loss, night sweats,  Fevers, chills, fatigue, lassitude. HEENT:   No headaches,  Difficulty swallowing,  Tooth/dental problems,  Sore throat,                No sneezing, itching, ear ache, nasal congestion, post nasal drip,   CV:  No chest pain,  Orthopnea, PND, swelling in lower extremities, anasarca, dizziness, palpitations  GI  No heartburn, indigestion, abdominal pain, nausea, vomiting, diarrhea, change in bowel habits, loss of appetite  Resp: No shortness of breath with exertion or at rest.  No excess mucus, no productive cough,  No non-productive cough,  No coughing up of blood.  No change in color of mucus.  No wheezing.  No chest wall deformity  Skin: no rash or lesions.  GU: no dysuria, change in color of urine, no urgency or frequency.  No flank pain.  MS:  No joint pain or swelling.  No decreased range of motion.  No back pain.  LLE PAIN IN CALF AREA  Psych:  No change in mood or affect. No depression or anxiety.  No memory loss.     Objective:   Physical Exam Vitals:   06/24/19 0900  BP: (!) 145/98  Pulse: 98  Temp: 98.2 F (36.8 C)  TempSrc: Oral  SpO2: 99%    Gen: Pleasant, well-nourished, in no distress,  normal affect  ENT: No lesions,  mouth clear,  oropharynx clear, no postnasal drip  Neck: No JVD, no TMG, no carotid bruits  Lungs: No use of accessory muscles, no dullness to percussion, clear without rales or rhonchi  Cardiovascular: RRR, heart sounds normal, no murmur or gallops, no peripheral edema  Abdomen: soft and NT, no HSM,  BS normal  Musculoskeletal: dressing over LLE Calf.  L foot swollen.  L thigh normal   Neuro: alert, non focal  Skin: Warm, no lesions or rashes  CBC Latest Ref Rng & Units 06/12/2019 06/09/2019 06/08/2019  WBC  4.0 - 10.5 K/uL 10.7(H)  11.2(H) 13.8(H)  Hemoglobin 13.0 - 17.0 g/dL 14.6 15.5 15.6  Hematocrit 39.0 - 52.0 % 43.9 46.9 48.8  Platelets 150 - 400 K/uL 342 288 239   BMP Latest Ref Rng & Units 06/14/2019 06/13/2019 06/12/2019  Glucose 70 - 99 mg/dL - - 224(H)  BUN 6 - 20 mg/dL - - 25(H)  Creatinine 0.61 - 1.24 mg/dL 0.83 1.03 0.97  Sodium 135 - 145 mmol/L - - 135  Potassium 3.5 - 5.1 mmol/L - - 3.7  Chloride 98 - 111 mmol/L - - 100  CO2 22 - 32 mmol/L - - 24  Calcium 8.9 - 10.3 mg/dL - - 9.2  Lipid Panel  No results found for: CHOL, TRIG, HDL, CHOLHDL, VLDL, LDLCALC, LDLDIRECT       Assessment & Plan:  I personally reviewed all images and lab data in the [facility] system as well as any outside material available during this office visit and agree with the  radiology impressions.   Essential hypertension Hypertension not yet well controlled  We will add amlodipine 10 mg daily and continue losartan and hydrochlorothiazide as prescribed  Type 2 diabetes mellitus with hyperglycemia, with long-term current use of insulin (HCC) Diabetes type 2 with use of NPH at this time and metformin Hemoglobin A1c was greater than 11 Lack of access to medications in the past due to insurance barriers and patient adherence  Plan now will be to prescribe Lantus 30 units nightly and begin Trulicity 4.65 mcg weekly Patient will continue metformin 500 mg twice daily  We will schedule a follow-up visit with our clinical pharmacist We will obtain lipid profile complete metabolic panel today in the office  Will obtain patient assistance for these medications  Will discontinue NPH  Cellulitis and abscess of left leg Cellulitis and abscess of the left leg improving  We will follow-up with orthopedics and if arrange an appointment July 30  The patient is finish current course of antibiotics    Sepsis ([place]) Sepsis has now resolved  Superficial thrombosis of left lower extremity Previous ultrasound showed superficial  thrombosis of the left lower extremity  I am concerned this may be driving some of the ongoing pain in the left lower extremity  After orthopedic follow-up may consider repeating venous Doppler   [place] was seen today for hospitalization follow-up.  Diagnoses and all orders for this visit:  Type 2 diabetes mellitus with hyperglycemia, with long-term current use of insulin ([place]) -     Comprehensive metabolic panel -     CBC with Differential/Platelet; Future -     CBC with Differential/Platelet  Hyperlipidemia associated with type 2 diabetes mellitus ([place]) -     Lipid panel  Sepsis due to methicillin susceptible Staphylococcus aureus (MSSA) without acute organ dysfunction (HCC) -     CBC with Differential/Platelet; Future -     CBC with Differential/Platelet  Cellulitis and abscess of left leg  Essential hypertension  Superficial thrombosis of left lower extremity  Other orders -     amLODipine (NORVASC) 10 MG tablet; Take 1 tablet (10 mg total) by mouth daily. -     Dulaglutide (TRULICITY) 6.81 EX/5.1ZG SOPN; Inject 0.75 mcg into the skin once a week. -     Insulin Glargine (LANTUS SOLOSTAR) 100 UNIT/ML Solostar Pen; Inject 30 Units into the skin daily. -     atorvastatin (LIPITOR) 20 MG tablet; Take 1 tablet (20 mg total) by mouth daily. -  metFORMIN (GLUCOPHAGE) 500 MG tablet; Take 1 tablet (500 mg total) by mouth 2 (two) times daily. Take 1 tablet in the morning and 2 tablets in the evening -     losartan (COZAAR) 100 MG tablet; Take 1 tablet (100 mg total) by mouth daily. -     hydrochlorothiazide (MICROZIDE) 12.5 MG capsule; Take 1 capsule (12.5 mg total) by mouth daily. -     Insulin Pen Needle 29G X 5MM MISC; Use with lantus -     oxyCODONE-acetaminophen (PERCOCET/ROXICET) 5-325 MG tablet; Take 1-2 tablets by mouth every 6 (six) hours as needed for up to 7 days for severe pain.

## 2019-06-25 ENCOUNTER — Telehealth: Payer: Self-pay | Admitting: Orthopaedic Surgery

## 2019-06-25 ENCOUNTER — Ambulatory Visit (INDEPENDENT_AMBULATORY_CARE_PROVIDER_SITE_OTHER): Payer: Self-pay | Admitting: Physician Assistant

## 2019-06-25 ENCOUNTER — Other Ambulatory Visit: Payer: Self-pay | Admitting: Physician Assistant

## 2019-06-25 ENCOUNTER — Encounter: Payer: Self-pay | Admitting: Physician Assistant

## 2019-06-25 DIAGNOSIS — L03116 Cellulitis of left lower limb: Secondary | ICD-10-CM

## 2019-06-25 DIAGNOSIS — L02416 Cutaneous abscess of left lower limb: Secondary | ICD-10-CM

## 2019-06-25 LAB — CBC WITH DIFFERENTIAL/PLATELET
Basophils Absolute: 0.1 10*3/uL (ref 0.0–0.2)
Basos: 1 %
EOS (ABSOLUTE): 0.1 10*3/uL (ref 0.0–0.4)
Eos: 2 %
Hematocrit: 47.9 % (ref 37.5–51.0)
Hemoglobin: 16 g/dL (ref 13.0–17.7)
Immature Grans (Abs): 0 10*3/uL (ref 0.0–0.1)
Immature Granulocytes: 0 %
Lymphocytes Absolute: 3.7 10*3/uL — ABNORMAL HIGH (ref 0.7–3.1)
Lymphs: 39 %
MCH: 27.9 pg (ref 26.6–33.0)
MCHC: 33.4 g/dL (ref 31.5–35.7)
MCV: 83 fL (ref 79–97)
Monocytes Absolute: 0.5 10*3/uL (ref 0.1–0.9)
Monocytes: 6 %
Neutrophils Absolute: 5 10*3/uL (ref 1.4–7.0)
Neutrophils: 52 %
Platelets: 286 10*3/uL (ref 150–450)
RBC: 5.74 x10E6/uL (ref 4.14–5.80)
RDW: 13.1 % (ref 11.6–15.4)
WBC: 9.4 10*3/uL (ref 3.4–10.8)

## 2019-06-25 LAB — COMPREHENSIVE METABOLIC PANEL
ALT: 47 IU/L — ABNORMAL HIGH (ref 0–44)
AST: 21 IU/L (ref 0–40)
Albumin/Globulin Ratio: 2.1 (ref 1.2–2.2)
Albumin: 4.7 g/dL (ref 4.1–5.2)
Alkaline Phosphatase: 71 IU/L (ref 39–117)
BUN/Creatinine Ratio: 16 (ref 9–20)
BUN: 17 mg/dL (ref 6–20)
Bilirubin Total: 0.2 mg/dL (ref 0.0–1.2)
CO2: 21 mmol/L (ref 20–29)
Calcium: 9.7 mg/dL (ref 8.7–10.2)
Chloride: 102 mmol/L (ref 96–106)
Creatinine, Ser: 1.06 mg/dL (ref 0.76–1.27)
GFR calc Af Amer: 108 mL/min/{1.73_m2} (ref >59)
GFR calc non Af Amer: 94 mL/min/{1.73_m2} (ref >59)
Globulin, Total: 2.2 g/dL (ref 1.5–4.5)
Glucose: 140 mg/dL — ABNORMAL HIGH (ref 65–99)
Potassium: 4.3 mmol/L (ref 3.5–5.2)
Sodium: 142 mmol/L (ref 134–144)
Total Protein: 6.9 g/dL (ref 6.0–8.5)

## 2019-06-25 LAB — LIPID PANEL
Chol/HDL Ratio: 4.8 ratio (ref 0.0–5.0)
Cholesterol, Total: 169 mg/dL (ref 100–199)
HDL: 35 mg/dL — ABNORMAL LOW (ref >39)
LDL Calculated: 89 mg/dL (ref 0–99)
Triglycerides: 223 mg/dL — ABNORMAL HIGH (ref 0–149)
VLDL Cholesterol Cal: 45 mg/dL — ABNORMAL HIGH (ref 5–40)

## 2019-06-25 MED ORDER — CEPHALEXIN 500 MG PO CAPS: 500.0000 mg | ORAL_CAPSULE | Freq: Four times a day (QID) | ORAL | 0 refills | Status: DC

## 2019-06-25 NOTE — Telephone Encounter (Signed)
Ok for orders? 

## 2019-06-25 NOTE — Progress Notes (Signed)
   Post-Op Visit Note   Patient: Anthony Hubbard           Date of Birth: [birth year]           MRN: [id number] Visit Date: 06/25/2019 PCP: Gregor Hams, FNP   Assessment & Plan:  Chief Complaint:  Chief Complaint  Patient presents with  . Left Leg - Wound Check   Visit Diagnoses:  1. Cellulitis and abscess of left leg     Plan: Patient is a pleasant 33 year old gentleman who presents our clinic today 15 days status post I&D left lower extremity abscess, date of surgery 06/10/2019.  Intraoperative cultures grew out gram-positive cocci.  He has been on Keflex until he ran out yesterday.  No calf pain.  No fevers or chills.  He has been doing wet-to-dry dressing changes with packing twice daily.  Examination of the left lower extremity reveals good beefy red granulation tissue to the wound.  He has mild to moderate swelling just distal to the wound and into the foot.  His calf is soft and nontender.  Negative [place].  He is neurovascular intact distally.  Today, we repacked the wound with wet-to-dry dressings.  He will continue to do this twice daily.  I have refilled his Keflex for another 2 weeks.  He will elevate as much as possible for swelling.  He will follow-up with [place] in 2 weeks time for repeat evaluation.  Call with concerns or questions in the meantime.  Follow-Up Instructions: Return in about 2 weeks (around 07/09/2019).   Orders:  No orders of the defined types were placed in this encounter.  No orders of the defined types were placed in this encounter.   Imaging: No new imaging  PMFS History: Patient Active Problem List   Diagnosis Date Noted  . Superficial thrombosis of left lower extremity 06/24/2019  . Cellulitis and abscess of left leg 06/07/2019  . Type 2 diabetes mellitus with hyperglycemia, with long-term current use of insulin ([place]) 06/07/2019  . Essential hypertension 06/07/2019  . Hypertriglyceridemia 11/20/2017  . Hyperlipidemia associated with type  2 diabetes mellitus ([place]) 06/12/2017   Past Medical History:  Diagnosis Date  . Diabetes mellitus without complication ([place])    type 2   . Hypertension   . Hypertensive urgency 06/07/2019  . Phimosis     Family History  Problem Relation Age of Onset  . Diabetes Father   . Diabetes Paternal Uncle     Past Surgical History:  Procedure Laterality Date  . CIRCUMCISION    . I&D EXTREMITY Left 06/10/2019   Procedure: IRRIGATION AND DEBRIDEMENT LEFT LOWER EXTREMITY;  Surgeon: Leandrew Koyanagi, MD;  Location: [facility];  Service: Orthopedics;  Laterality: Left;  . TONSILLECTOMY     Social History   Occupational History  . Not on file  Tobacco Use  . Smoking status: Never Smoker  . Smokeless tobacco: Never Used  Substance and Sexual Activity  . Alcohol use: No  . Drug use: Yes    Types: Marijuana  . Sexual activity: Not on file

## 2019-06-25 NOTE — Telephone Encounter (Signed)
I called [facility] and advised. 

## 2019-06-25 NOTE — Telephone Encounter (Signed)
[facility]/PT called needing verbal orders 1x week for 3 weeks  Please call [facility] @ [phone]

## 2019-06-25 NOTE — Telephone Encounter (Signed)
yes

## 2019-07-09 ENCOUNTER — Encounter: Payer: Self-pay | Admitting: Orthopaedic Surgery

## 2019-07-09 ENCOUNTER — Ambulatory Visit (INDEPENDENT_AMBULATORY_CARE_PROVIDER_SITE_OTHER): Payer: Self-pay | Admitting: Orthopaedic Surgery

## 2019-07-09 VITALS — Ht 67.0 in | Wt 233.0 lb

## 2019-07-09 DIAGNOSIS — L02416 Cutaneous abscess of left lower limb: Secondary | ICD-10-CM

## 2019-07-09 DIAGNOSIS — L03116 Cellulitis of left lower limb: Secondary | ICD-10-CM

## 2019-07-09 NOTE — Progress Notes (Signed)
Patient ID: Anthony Hubbard, male   DOB: 1986-09-06, 33 y.o.   MRN: [id number]  [name] comes back today for wound check.  Doing well.  Continues to take antibiotics.  I removed the packing today in the wound looks really good.  There is really good beefy granulation tissue.  No swelling no drainage.  We repacked it with wet-to-dry's.  He will continue to do this twice a day.  I would like to recheck him in about another month.  Questions encouraged and answered.

## 2019-07-15 ENCOUNTER — Ambulatory Visit: Payer: Self-pay | Attending: Critical Care Medicine | Admitting: Critical Care Medicine

## 2019-07-15 ENCOUNTER — Other Ambulatory Visit: Payer: Self-pay

## 2019-07-15 ENCOUNTER — Encounter: Payer: Self-pay | Admitting: Pharmacist

## 2019-07-15 ENCOUNTER — Ambulatory Visit (HOSPITAL_BASED_OUTPATIENT_CLINIC_OR_DEPARTMENT_OTHER): Payer: Self-pay | Admitting: Pharmacist

## 2019-07-15 ENCOUNTER — Encounter: Payer: Self-pay | Admitting: Critical Care Medicine

## 2019-07-15 VITALS — BP 120/77 | HR 73 | Temp 98.3°F | Resp 16 | Ht 71.0 in | Wt 235.8 lb

## 2019-07-15 DIAGNOSIS — Z79899 Other long term (current) drug therapy: Secondary | ICD-10-CM

## 2019-07-15 DIAGNOSIS — E785 Hyperlipidemia, unspecified: Secondary | ICD-10-CM

## 2019-07-15 DIAGNOSIS — L03116 Cellulitis of left lower limb: Secondary | ICD-10-CM

## 2019-07-15 DIAGNOSIS — B351 Tinea unguium: Secondary | ICD-10-CM

## 2019-07-15 DIAGNOSIS — Z794 Long term (current) use of insulin: Secondary | ICD-10-CM

## 2019-07-15 DIAGNOSIS — I82812 Embolism and thrombosis of superficial veins of left lower extremities: Secondary | ICD-10-CM

## 2019-07-15 DIAGNOSIS — I10 Essential (primary) hypertension: Secondary | ICD-10-CM

## 2019-07-15 DIAGNOSIS — L02416 Cutaneous abscess of left lower limb: Secondary | ICD-10-CM

## 2019-07-15 DIAGNOSIS — M21612 Bunion of left foot: Secondary | ICD-10-CM

## 2019-07-15 DIAGNOSIS — E1165 Type 2 diabetes mellitus with hyperglycemia: Secondary | ICD-10-CM

## 2019-07-15 DIAGNOSIS — E1169 Type 2 diabetes mellitus with other specified complication: Secondary | ICD-10-CM

## 2019-07-15 DIAGNOSIS — M21611 Bunion of right foot: Secondary | ICD-10-CM

## 2019-07-15 LAB — GLUCOSE, POCT (MANUAL RESULT ENTRY): POC Glucose: 145 mg/dl — AB (ref 70–99)

## 2019-07-15 MED ORDER — IBUPROFEN 600 MG PO TABS: 600.0000 mg | ORAL_TABLET | Freq: Three times a day (TID) | ORAL | 1 refills | Status: DC | PRN

## 2019-07-15 MED FILL — !LANTUS SOLOSTAR 100UNITS/M: 100 | 30 days supply | Qty: 9 | Fill #1

## 2019-07-15 MED FILL — ?METFORMIN HCL 500MG TABLET: 500 | 30 days supply | Qty: 90 | Fill #0

## 2019-07-15 MED FILL — ?ATORVASTATIN 20 MG TABLET: 20 | 30 days supply | Qty: 30 | Fill #1

## 2019-07-15 MED FILL — IBUPROFEN 600 MG TABLET: 600 | 20 days supply | Qty: 60 | Fill #0

## 2019-07-15 MED FILL — LOSARTAN POTASSIUM 100 MG T: 100 | 30 days supply | Qty: 30 | Fill #0

## 2019-07-15 MED FILL — HYDROCHLOROTHIAZIDE 12.5 MG: 12.5 | 30 days supply | Qty: 30 | Fill #0

## 2019-07-15 MED FILL — TRULICITY 0.75 MG/0.5 ML PE: 0.75 | 30 days supply | Qty: 2 | Fill #1

## 2019-07-15 MED FILL — ?AMLODIPINE BESYLATE 10 MG: 10 | 30 days supply | Qty: 30 | Fill #1

## 2019-07-15 NOTE — Progress Notes (Signed)
Patient was educated on the use of our pharmacy. Encouraged him to complete necessary paperwork for MAP w/ the Trulicity and Lantus. He reports doing well on these medications. No side effects; reported home sugars have improved. He reports "living in the 130-180 range". Dr. Joya Gaskins to see pt this morning concerning cellulitis and other chronic conditions. I will follow-up with the patient as deemed appropriate by Dr. Joya Gaskins.

## 2019-07-15 NOTE — Assessment & Plan Note (Signed)
Bilateral toenail fungus both feet therefore will refer to podiatry

## 2019-07-15 NOTE — Assessment & Plan Note (Addendum)
Cellulitis abscess left lower extremity improving per orthopedics  I offered ibuprofen 600 mg tablet 3 times daily as needed for pain in place of high-dose aspirin

## 2019-07-15 NOTE — Assessment & Plan Note (Signed)
Bilateral bunions on the great toe both feet therefore will refer to podiatry

## 2019-07-15 NOTE — Assessment & Plan Note (Signed)
LDL at 89 and will continue atorvastatin to continue to reach goal

## 2019-07-15 NOTE — Assessment & Plan Note (Signed)
Improved glycemic control with Lantus and Trulicity along with metformin  Patient to follow-up with clinical pharmacist today as well as subsequent visit

## 2019-07-15 NOTE — Assessment & Plan Note (Signed)
Blood pressure under good control we will continue Norvasc and hydrochlorthiazide for now

## 2019-07-15 NOTE — Patient Instructions (Addendum)
A flu vaccine and tetanus vaccine were given today  Try ibuprofen 600 mg tablet 3 times daily as needed for pain and discontinue the full dose aspirin  Please get your eyes examined in with your diabetes  No change in other medications at this time  A podiatry referral be made for your toenail fungus and bunions for diabetic foot care  Return to see Lurena Joiner our clinical pharmacist in 2 weeks for your blood pressure and diabetes follow-up  Return to see Dr. Joya Gaskins in 2 months  Keep your appointment with orthopedics for your wound care and antibiotics duration will be determined by orthopedics    Td Vaccine (Tetanus and Diphtheria): What You Need to Know 1. Why get vaccinated? Tetanus  and diphtheria are very serious diseases. They are rare in the [place] today, but people who do become infected often have severe complications. Td vaccine is used to protect adolescents and adults from both of these diseases. Both tetanus and diphtheria are infections caused by bacteria. Diphtheria spreads from person to person through coughing or sneezing. Tetanus-causing bacteria enter the body through cuts, scratches, or wounds. TETANUS (Lockjaw) causes painful muscle tightening and stiffness, usually all over the body.  It can lead to tightening of muscles in the head and neck so you can't open your mouth, swallow, or sometimes even breathe. Tetanus kills about 1 out of every 10 people who are infected even after receiving the best medical care. DIPHTHERIA can cause a thick coating to form in the back of the throat.  It can lead to breathing problems, paralysis, heart failure, and death. Before vaccines, as many as 200,000 cases of diphtheria and hundreds of cases of tetanus were reported in the [place] each year. Since vaccination began, reports of cases for both diseases have dropped by about 99%. 2. Td vaccine Td vaccine can protect adolescents and adults from tetanus and diphtheria. Td is  usually given as a booster dose every 10 years but it can also be given earlier after a severe and dirty wound or burn. Another vaccine, called Tdap, which protects against pertussis in addition to tetanus and diphtheria, is sometimes recommended instead of Td vaccine. Your doctor or the person giving you the vaccine can give you more information. Td may safely be given at the same time as other vaccines. 3. Some people should not get this vaccine  A person who has ever had a life-threatening allergic reaction after a previous dose of any tetanus or diphtheria containing vaccine, OR has a severe allergy to any part of this vaccine, should not get Td vaccine. Tell the person giving the vaccine about any severe allergies.  Talk to your doctor if you: ? had severe pain or swelling after any vaccine containing diphtheria or tetanus, ? ever had a condition called Guillain Barr Syndrome (GBS), ? aren't feeling well on the day the shot is scheduled. 4. Risks of a vaccine reaction With any medicine, including vaccines, there is a chance of side effects. These are usually mild and go away on their own. Serious reactions are also possible but are rare. Most people who get Td vaccine do not have any problems with it. Mild Problems following Td vaccine: (Did not interfere with activities)  Pain where the shot was given (about 8 people in 10)  Redness or swelling where the shot was given (about 1 person in 4)  Mild fever (rare)  Headache (about 1 person in 4)  Tiredness (about 1 person in  4) Moderate Problems following Td vaccine: (Interfered with activities, but did not require medical attention)  Fever over 102F (rare) Severe Problems following Td vaccine: (Unable to perform usual activities; required medical attention)  Swelling, severe pain, bleeding and/or redness in the arm where the shot was given (rare). Problems that could happen after any vaccine:  People sometimes faint after a  medical procedure, including vaccination. Sitting or lying down for about 15 minutes can help prevent fainting, and injuries caused by a fall. Tell your doctor if you feel dizzy, or have vision changes or ringing in the ears.  Some people get severe pain in the shoulder and have difficulty moving the arm where a shot was given. This happens very rarely.  Any medication can cause a severe allergic reaction. Such reactions from a vaccine are very rare, estimated at fewer than 1 in a million doses, and would happen within a few minutes to a few hours after the vaccination. As with any medicine, there is a very remote chance of a vaccine causing a serious injury or death. The safety of vaccines is always being monitored. For more information, visit: http://floyd.org/www.cdc.gov/vaccinesafety/ 5. What if there is a serious reaction? What should I look for?  Look for anything that concerns you, such as signs of a severe allergic reaction, very high fever, or unusual behavior. Signs of a severe allergic reaction can include hives, swelling of the face and throat, difficulty breathing, a fast heartbeat, dizziness, and weakness. These would usually start a few minutes to a few hours after the vaccination. What should I do?  If you think it is a severe allergic reaction or other emergency that can't wait, call 9-1-1 or get the person to the nearest hospital. Otherwise, call your doctor.  Afterward, the reaction should be reported to the Vaccine Adverse Event Reporting System (VAERS). Your doctor might file this report, or you can do it yourself through the VAERS web site at www.vaers.LAgents.nohhs.gov, or by calling 1-702-471-8210. VAERS does not give medical advice. 6. The National Vaccine Injury Compensation Program The [employer] Vaccine Injury Compensation Program (VICP) is a federal program that was created to compensate people who may have been injured by certain vaccines. Persons who believe they may have been injured by a  vaccine can learn about the program and about filing a claim by calling 1-740-187-1444 or visiting the VICP website at SpiritualWord.atwww.hrsa.gov/vaccinecompensation. There is a time limit to file a claim for compensation. 7. How can I learn more?  Ask your doctor. He or she can give you the vaccine package insert or suggest other sources of information.  Call your local or state health department.  Contact the Centers for Disease Control and Prevention (CDC): ? Call 84537248921-706-333-3293 (1-800-CDC-INFO) ? Visit CDC's website at PicCapture.uywww.cdc.gov/vaccines Vaccine Information Statement Td Vaccine (03/06/16) This information is not intended to replace advice given to you by your health care provider. Make sure you discuss any questions you have with your health care provider. Document Released: 09/09/2006 Document Revised: 06/30/2018 Document Reviewed: 06/30/2018 Elsevier Interactive Patient Education  2020 Elsevier Inc.   Influenza Virus Vaccine injection (Fluarix) What is this medicine? INFLUENZA VIRUS VACCINE (in floo EN zuh VAHY ruhs vak SEEN) helps to reduce the risk of getting influenza also known as the flu. This medicine may be used for other purposes; ask your health care provider or pharmacist if you have questions. COMMON BRAND NAME(S): Fluarix, Fluzone What should I tell my health care provider before I take this medicine? They  need to know if you have any of these conditions:  bleeding disorder like hemophilia  fever or infection  Guillain-Barre syndrome or other neurological problems  immune system problems  infection with the human immunodeficiency virus (HIV) or AIDS  low blood platelet counts  multiple sclerosis  an unusual or allergic reaction to influenza virus vaccine, eggs, chicken proteins, latex, gentamicin, other medicines, foods, dyes or preservatives  pregnant or trying to get pregnant  breast-feeding How should I use this medicine? This vaccine is for injection into a  muscle. It is given by a health care professional. A copy of Vaccine Information Statements will be given before each vaccination. Read this sheet carefully each time. The sheet may change frequently. Talk to your pediatrician regarding the use of this medicine in children. Special care may be needed. Overdosage: If you think you have taken too much of this medicine contact a poison control center or emergency room at once. NOTE: This medicine is only for you. Do not share this medicine with others. What if I miss a dose? This does not apply. What may interact with this medicine?  chemotherapy or radiation therapy  medicines that lower your immune system like etanercept, anakinra, infliximab, and adalimumab  medicines that treat or prevent blood clots like warfarin  phenytoin  steroid medicines like prednisone or cortisone  theophylline  vaccines This list may not describe all possible interactions. Give your health care provider a list of all the medicines, herbs, non-prescription drugs, or dietary supplements you use. Also tell them if you smoke, drink alcohol, or use illegal drugs. Some items may interact with your medicine. What should I watch for while using this medicine? Report any side effects that do not go away within 3 days to your doctor or health care professional. Call your health care provider if any unusual symptoms occur within 6 weeks of receiving this vaccine. You may still catch the flu, but the illness is not usually as bad. You cannot get the flu from the vaccine. The vaccine will not protect against colds or other illnesses that may cause fever. The vaccine is needed every year. What side effects may I notice from receiving this medicine? Side effects that you should report to your doctor or health care professional as soon as possible:  allergic reactions like skin rash, itching or hives, swelling of the face, lips, or tongue Side effects that usually do not  require medical attention (report to your doctor or health care professional if they continue or are bothersome):  fever  headache  muscle aches and pains  pain, tenderness, redness, or swelling at site where injected  weak or tired This list may not describe all possible side effects. Call your doctor for medical advice about side effects. You may report side effects to FDA at 1-800-FDA-1088. Where should I keep my medicine? This vaccine is only given in a clinic, pharmacy, doctor's office, or other health care setting and will not be stored at home. NOTE: This sheet is a summary. It may not cover all possible information. If you have questions about this medicine, talk to your doctor, pharmacist, or health care provider.  2020 Elsevier/Gold Standard (2008-06-09 09:30:40)

## 2019-07-15 NOTE — Progress Notes (Signed)
Subjective:    Patient ID: Anthony Hubbard, male    DOB: 06/12/1986, 33 y.o.   MRN: [id number]  32 y.o.M with type 2 diabetes and hypertension and recent admission for left lower extremity cellulitis and abscess with need for drainage per orthopedics  This is a follow-up visit for hypertension type 2 diabetes and recent admission for left lower extremity cellulitis and abscess with drainage.  The patient's lower extremity is improving and wound care has been ongoing per orthopedics.  Recent notes from orthopedics suggest the wound is slowly healing.  The patient still on Keflex for antibiotic coverage of gram-positive cocci from the cultures.  The patient maintains the Lantus and also now on Trulicity.  Blood sugars have been ranging in the lower area of 1 20-1 40 at home.  The patient is due tetanus vaccine and flu shot.  Patient also needs a foot exam as well as microalbumin check in the urine.  Note the patient has still some pain particular with dressing changes on the wound.   Past Medical History:  Diagnosis Date  . Diabetes mellitus without complication ([place])    type 2   . Hypertension   . Hypertensive urgency 06/07/2019  . Phimosis      Family History  Problem Relation Age of Onset  . Diabetes Father   . Diabetes Paternal Uncle      Social History   Socioeconomic History  . Marital status: Married    Spouse name: Not on file  . Number of children: Not on file  . Years of education: Not on file  . Highest education level: Not on file  Occupational History  . Not on file  Social Needs  . Financial resource strain: Not on file  . Food insecurity    Worry: Not on file    Inability: Not on file  . Transportation needs    Medical: Not on file    Non-medical: Not on file  Tobacco Use  . Smoking status: Never Smoker  . Smokeless tobacco: Never Used  Substance and Sexual Activity  . Alcohol use: No  . Drug use: Yes    Types: Marijuana  . Sexual activity: Not on  file  Lifestyle  . Physical activity    Days per week: Not on file    Minutes per session: Not on file  . Stress: Not on file  Relationships  . Social Herbalist on phone: Not on file    Gets together: Not on file    Attends religious service: Not on file    Active member of club or organization: Not on file    Attends meetings of clubs or organizations: Not on file    Relationship status: Not on file  . Intimate partner violence    Fear of current or ex partner: Not on file    Emotionally abused: Not on file    Physically abused: Not on file    Forced sexual activity: Not on file  Other Topics Concern  . Not on file  Social History Narrative  . Not on file     No Known Allergies   Outpatient Medications Prior to Visit  Medication Sig Dispense Refill  . amLODipine (NORVASC) 10 MG tablet Take 1 tablet (10 mg total) by mouth daily. 90 tablet 3  . atorvastatin (LIPITOR) 20 MG tablet Take 1 tablet (20 mg total) by mouth daily. 90 tablet 3  . blood glucose meter kit and supplies KIT Dispense  based on patient and insurance preference. Use up to four times daily as directed. (FOR ICD-9 250.00, 250.01). 1 each 0  . cephALEXin (KEFLEX) 500 MG capsule Take 1 capsule (500 mg total) by mouth every 6 (six) hours. 56 capsule 0  . docusate sodium (COLACE) 100 MG capsule Take 1 capsule (100 mg total) by mouth 2 (two) times daily. 10 capsule 0  . Dulaglutide (TRULICITY) 5.46 EV/0.3JK SOPN Inject 0.75 mcg into the skin once a week. 4 pen 3  . hydrochlorothiazide (MICROZIDE) 12.5 MG capsule Take 1 capsule (12.5 mg total) by mouth daily. 30 capsule 0  . Insulin Glargine (LANTUS SOLOSTAR) 100 UNIT/ML Solostar Pen Inject 30 Units into the skin daily. 5 pen 6  . Insulin Pen Needle 29G X 5MM MISC Use with lantus 100 each 0  . losartan (COZAAR) 100 MG tablet Take 1 tablet (100 mg total) by mouth daily. 30 tablet 0  . metFORMIN (GLUCOPHAGE) 500 MG tablet Take 1 tablet (500 mg total) by mouth 2  (two) times daily. Take 1 tablet in the morning and 2 tablets in the evening 90 tablet 0  . polyethylene glycol (MIRALAX / GLYCOLAX) 17 g packet Take 17 g by mouth daily as needed for mild constipation. 14 each 0  . aspirin 325 MG EC tablet Take 325 mg by mouth daily as needed for pain.    . meloxicam (MOBIC) 15 MG tablet Take 15 mg by mouth daily as needed for muscle spasms.     No facility-administered medications prior to visit.     Review of Systems Constitutional:   No  weight loss, night sweats,  Fevers, chills, fatigue, lassitude. HEENT:   No headaches,  Difficulty swallowing,  Tooth/dental problems,  Sore throat,                No sneezing, itching, ear ache, nasal congestion, post nasal drip,   CV:  No chest pain,  Orthopnea, PND, swelling in lower extremities, anasarca, dizziness, palpitations  GI  No heartburn, indigestion, abdominal pain, nausea, vomiting, diarrhea, change in bowel habits, loss of appetite  Resp: No shortness of breath with exertion or at rest.  No excess mucus, no productive cough,  No non-productive cough,  No coughing up of blood.  No change in color of mucus.  No wheezing.  No chest wall deformity  Skin: no rash or lesions.  GU: no dysuria, change in color of urine, no urgency or frequency.  No flank pain.  MS:  No joint pain or swelling.  No decreased range of motion.  No back pain.  LLE PAIN IN CALF AREA  Psych:  No change in mood or affect. No depression or anxiety.  No memory loss.     Objective:   Physical Exam Vitals:   07/15/19 0926  BP: 120/77  Pulse: 73  Resp: 16  Temp: 98.3 F (36.8 C)  TempSrc: Oral  SpO2: 98%  Weight: 235 lb 12.8 oz (107 kg)  Height: '5\' 11"'  (1.803 m)    Gen: Pleasant, well-nourished, in no distress,  normal affect  ENT: No lesions,  mouth clear,  oropharynx clear, no postnasal drip  Neck: No JVD, no TMG, no carotid bruits  Lungs: No use of accessory muscles, no dullness to percussion, clear without rales or  rhonchi  Cardiovascular: RRR, heart sounds normal, no murmur or gallops, no peripheral edema  Abdomen: soft and NT, no HSM,  BS normal  Musculoskeletal: dressing over LLE Calf.  L foot  Less  swollen.  L thigh normal   Neuro: alert, non focal  Skin: Warm, no lesions or rashes  FOOT EXAM:  Normal sensation and pulses.  Toenail fungus and bunions  CBC Latest Ref Rng & Units 06/24/2019 06/12/2019 06/09/2019  WBC 3.4 - 10.8 x10E3/uL 9.4 10.7(H) 11.2(H)  Hemoglobin 13.0 - 17.7 g/dL 16.0 14.6 15.5  Hematocrit 37.5 - 51.0 % 47.9 43.9 46.9  Platelets 150 - 450 x10E3/uL 286 342 288   BMP Latest Ref Rng & Units 06/24/2019 06/14/2019 06/13/2019  Glucose 65 - 99 mg/dL 140(H) - -  BUN 6 - 20 mg/dL 17 - -  Creatinine 0.76 - 1.27 mg/dL 1.06 0.83 1.03  BUN/Creat Ratio 9 - 20 16 - -  Sodium 134 - 144 mmol/L 142 - -  Potassium 3.5 - 5.2 mmol/L 4.3 - -  Chloride 96 - 106 mmol/L 102 - -  CO2 20 - 29 mmol/L 21 - -  Calcium 8.7 - 10.2 mg/dL 9.7 - -  Lipid Panel     Component Value Date/Time   CHOL 169 06/24/2019 1052   TRIG 223 (H) 06/24/2019 1052   HDL 35 (L) 06/24/2019 1052   CHOLHDL 4.8 06/24/2019 1052   LDLCALC 89 06/24/2019 1052   cbg today 145      Assessment & Plan:  I personally reviewed all images and lab data in the [facility] system as well as any outside material available during this office visit and agree with the  radiology impressions.   Essential hypertension Blood pressure under good control we will continue Norvasc and hydrochlorthiazide for now  Superficial thrombosis of left lower extremity No evidence of progression of superficial thrombosis left lower extremity so we will hold off on anticoagulants  Hyperlipidemia associated with type 2 diabetes mellitus (HCC) LDL at 89 and will continue atorvastatin to continue to reach goal  Type 2 diabetes mellitus with hyperglycemia, with long-term current use of insulin (HCC) Improved glycemic control with Lantus and Trulicity along  with metformin  Patient to follow-up with clinical pharmacist today as well as subsequent visit  Bilateral bunions Bilateral bunions on the great toe both feet therefore will refer to podiatry  Toenail fungus Bilateral toenail fungus both feet therefore will refer to podiatry  Cellulitis and abscess of left leg Cellulitis abscess left lower extremity improving per orthopedics  I offered ibuprofen 600 mg tablet 3 times daily as needed for pain in place of high-dose aspirin   [place] was seen today for diabetes and hypertension.  Diagnoses and all orders for this visit:  Type 2 diabetes mellitus with hyperglycemia, with long-term current use of insulin (HCC) -     POCT glucose (manual entry) -     Microalbumin / creatinine urine ratio  Toenail fungus -     Ambulatory referral to Podiatry  Bilateral bunions -     Ambulatory referral to Podiatry  Essential hypertension  Hyperlipidemia associated with type 2 diabetes mellitus ([place])  Cellulitis and abscess of left leg  Superficial thrombosis of left lower extremity  Other orders -     ibuprofen (ADVIL) 600 MG tablet; Take 1 tablet (600 mg total) by mouth every 8 (eight) hours as needed.  Tetanus shot and flu vaccine were given at this visit and I recommended the patient receive an eye exam

## 2019-07-15 NOTE — Assessment & Plan Note (Signed)
No evidence of progression of superficial thrombosis left lower extremity so we will hold off on anticoagulants

## 2019-07-29 ENCOUNTER — Other Ambulatory Visit: Payer: Self-pay

## 2019-07-29 ENCOUNTER — Ambulatory Visit: Payer: Self-pay | Attending: Family Medicine | Admitting: Pharmacist

## 2019-07-29 DIAGNOSIS — Z794 Long term (current) use of insulin: Secondary | ICD-10-CM

## 2019-07-29 DIAGNOSIS — E1165 Type 2 diabetes mellitus with hyperglycemia: Secondary | ICD-10-CM

## 2019-07-29 MED ORDER — TRULICITY 1.5 MG/0.5ML ~~LOC~~ SOAJ: 1.5000 mg | SUBCUTANEOUS | 2 refills | Status: DC

## 2019-07-29 MED ORDER — METFORMIN HCL 500 MG PO TABS: 1000.0000 mg | ORAL_TABLET | Freq: Two times a day (BID) | ORAL | 2 refills | Status: DC

## 2019-07-29 NOTE — Progress Notes (Signed)
   S:    PCP: Dr. Joya Gaskins   No chief complaint on file.  Patient arrives good spirits.  Presents for diabetes evaluation, education, and management Patient was referred and last seen by Primary Care Provider on 07/15/19.   Patient reports diabetes was diagnosed several years ago.  Family/Social History: DM (father); never smoker, denies alcohol intake.  Insurance coverage/medication affordability: self-pay   Patient reports adherence with medications.  Current diabetes medications include: metformin 500 mg (1 tablet in the morning, 2 in the evening), Lantus 30 units daily, Trulicity 1.88 mg once weekly  Current hypertension medications include: amlodipine 10 mg daily, HCTZ 12.5 mg daily, losartan 100 mg daily  Current hyperlipidemia medications include: atorvastatin 20 mg daily   Patient denies hypoglycemic events.  Patient reported dietary habits: eats fish and vegetables, everything cooked in the oven, no oils. Drinks water and green tea w/o sugar. Patient-reported exercise habits: walks around apartment complex for ~1hr every day.   Patient denies nocturia.  Patient denies neuropathy. Patient denies visual changes. Patient reports self foot exams.     O:  Physical Exam   ROS  Lab Results  Component Value Date   HGBA1C 11.9 (H) 06/08/2019   There were no vitals filed for this visit.  Lipid Panel     Component Value Date/Time   CHOL 169 06/24/2019 1052   TRIG 223 (H) 06/24/2019 1052   HDL 35 (L) 06/24/2019 1052   CHOLHDL 4.8 06/24/2019 1052   LDLCALC 89 06/24/2019 1052   Home fasting CBG: 110-150. Per patient most sugars stay in 130s.   2 hour post-prandial/random CBG: 120-200s. Per patient most sugars stay in 180s.  Clinical ASCVD: No  The ASCVD Risk score Mikey Bussing DC Jr., et al., 2013) failed to calculate for the following reasons:   The 2013 ASCVD risk score is only valid for ages 54 to 58   A/P: Diabetes longstanding currently uncontrolled based on A1c but  home CBGs are improving. Patient is able to verbalize appropriate hypoglycemia management plan. Patient is adherent with medication. I recommend to increase metformin to 2000 mg daily as he is now only taking 1500mg  daily. Additionally, with his post-prandial levels occasionally in the 200s, we can increase Trulicity to 1.5 mg weekly. Patient agrees with plan.  -Continue Lantus 30 units qhs. -Increase Metformin to 1000mg  BID -Increase Trulicity to 1.5 mg [place]riv[place] once weekly once patient finishes 0.75 mg pens. -Extensively discussed pathophysiology of DM, recommended lifestyle interventions, dietary effects on glycemic control -Counseled on s/sx of and management of hypoglycemia -Microalbumin/creatinine ratio lab today in clinic. -Next A1C anticipated 08/2019.   ASCVD risk - primary prevention in patient with DM. Last LDL is controlled. Patient does not have multiple risk factors for ASCVD - Continue moderate intensity atorvastatin 20mg .   Hypertension longstanding currently controlled.  BP goal <130/80 mmHg. Patient is adherent with medication. -continue amlodipine, HCTZ and losartan  HM: UTD on appropriate vaccines.   Written patient instructions provided.  Total time in face to face counseling 30 minutes.   Follow up with PCP Clinic Visit in October.    Patient seen with: Dillard Essex PharmD Candidate  Class of 2022 [place], PharmD, [place] [phone]

## 2019-07-29 NOTE — Patient Instructions (Signed)
Thank you for coming to see me today. Please do the following:  1. Increase metformin to 2 tablets in the morning and 2 tablets at night. Remember to take after eating.  2. Increase Trulicity to 1.5 mg weekly. You can finish your other pens first.  3. Continue checking blood sugars at home.  4. Continue making the lifestyle changes we've discussed together during our visit. Diet and exercise play a significant role in improving your blood sugars.  5. Follow-up with Dr. Joya Gaskins in October - if you need to see me before then, please call.    Hypoglycemia or low blood sugar:   Low blood sugar can happen quickly and may become an emergency if not treated right away.   While this shouldn't happen often, it can be brought upon if you skip a meal or do not eat enough. Also, if your insulin or other diabetes medications are dosed too high, this can cause your blood sugar to go to low.   Warning signs of low blood sugar include: 1. Feeling shaky or dizzy 2. Feeling weak or tired  3. Excessive hunger 4. Feeling anxious or upset  5. Sweating even when you aren't exercising  What to do if I experience low blood sugar? 1. Check your blood sugar with your meter. If lower than 70, proceed to step 2.  2. Treat with 3-4 glucose tablets or 3 packets of regular sugar. If these aren't around, you can try hard candy. Yet another option would be to drink 4 ounces of fruit juice or 6 ounces of REGULAR soda.  3. Re-check your sugar in 15 minutes. If it is still below 70, do what you did in step 2 again. If has come back up, go ahead and eat a snack or small meal at this time.

## 2019-07-30 ENCOUNTER — Encounter: Payer: Self-pay | Admitting: Pharmacist

## 2019-07-30 LAB — MICROALBUMIN / CREATININE URINE RATIO
Creatinine, Urine: 263.4 mg/dL
Microalb/Creat Ratio: 18 mg/g creat (ref 0–29)
Microalbumin, Urine: 47.8 ug/mL

## 2019-07-31 ENCOUNTER — Telehealth: Payer: Self-pay

## 2019-07-31 NOTE — Telephone Encounter (Signed)
Patient name and DOB has been verified Patient was informed of lab results. Patient had no questions.  

## 2019-07-31 NOTE — Telephone Encounter (Signed)
-----   Message from Elsie Stain, MD sent at 07/30/2019 11:49 AM EDT ----- Can you let Anthony Hubbard know his urine protein was normal

## 2019-08-06 ENCOUNTER — Ambulatory Visit: Payer: Self-pay | Admitting: Orthopaedic Surgery

## 2019-09-23 ENCOUNTER — Ambulatory Visit: Payer: Self-pay | Admitting: Critical Care Medicine

## 2019-09-23 NOTE — Progress Notes (Deleted)
Subjective:    Patient ID: Anthony Hubbard, male    DOB: 19-Apr-1986, 33 y.o.   MRN: [id number]  32 y.o.M with type 2 diabetes and hypertension and recent admission for left lower extremity cellulitis and abscess with need for drainage per orthopedics  This is a follow-up visit for hypertension type 2 diabetes and recent admission for left lower extremity cellulitis and abscess with drainage.  The patient's lower extremity is improving and wound care has been ongoing per orthopedics.  Recent notes from orthopedics suggest the wound is slowly healing.  The patient still on Keflex for antibiotic coverage of gram-positive cocci from the cultures.  The patient maintains the Lantus and also now on Trulicity.  Blood sugars have been ranging in the lower area of 1 20-1 40 at home.  The patient is due tetanus vaccine and flu shot.  Patient also needs a foot exam as well as microalbumin check in the urine.  Note the patient has still some pain particular with dressing changes on the wound.  09/23/2019  Essential hypertension Blood pressure under good control we will continue Norvasc and hydrochlorthiazide for now  Superficial thrombosis of left lower extremity No evidence of progression of superficial thrombosis left lower extremity so we will hold off on anticoagulants  Hyperlipidemia associated with type 2 diabetes mellitus ([place]) LDL at 89 and will continue atorvastatin to continue to reach goal  Type 2 diabetes mellitus with hyperglycemia, with long-term current use of insulin (HCC) Improved glycemic control with Lantus and Trulicity along with metformin  Patient to follow-up with clinical pharmacist today as well as subsequent visit  Bilateral bunions Bilateral bunions on the great toe both feet therefore will refer to podiatry  Toenail fungus Bilateral toenail fungus both feet therefore will refer to podiatry  Cellulitis and abscess of left leg Cellulitis abscess left lower extremity  improving per orthopedics  I offered ibuprofen 600 mg tablet 3 times daily as needed for pain in place of high-dose aspirin   Anthony Hubbard was seen today for diabetes and hypertension.  Diagnoses and all orders for this visit:  Type 2 diabetes mellitus with hyperglycemia, with long-term current use of insulin (HCC) -     POCT glucose (manual entry) -     Microalbumin / creatinine urine ratio  Toenail fungus -     Ambulatory referral to Podiatry  Bilateral bunions -     Ambulatory referral to Podiatry  Essential hypertension  Hyperlipidemia associated with type 2 diabetes mellitus ([place])  Cellulitis and abscess of left leg  Superficial thrombosis of left lower extremity  Other orders -     ibuprofen (ADVIL) 600 MG tablet; Take 1 tablet (600 mg total) by mouth every 8 (eight) hours as needed.  Tetanus shot and flu vaccine were given at this visit and I recommended the patient receive an eye exam  Past Medical History:  Diagnosis Date  . Diabetes mellitus without complication ([place])    type 2   . Hypertension   . Hypertensive urgency 06/07/2019  . Phimosis      Family History  Problem Relation Age of Onset  . Diabetes Father   . Diabetes Paternal Uncle      Social History   Socioeconomic History  . Marital status: Married    Spouse name: Not on file  . Number of children: Not on file  . Years of education: Not on file  . Highest education level: Not on file  Occupational History  . Not on file  Social Needs  . Financial resource strain: Not on file  . Food insecurity    Worry: Not on file    Inability: Not on file  . Transportation needs    Medical: Not on file    Non-medical: Not on file  Tobacco Use  . Smoking status: Never Smoker  . Smokeless tobacco: Never Used  Substance and Sexual Activity  . Alcohol use: No  . Drug use: Yes    Types: Marijuana  . Sexual activity: Not on file  Lifestyle  . Physical activity    Days per week: Not on file    Minutes  per session: Not on file  . Stress: Not on file  Relationships  . Social Herbalist on phone: Not on file    Gets together: Not on file    Attends religious service: Not on file    Active member of club or organization: Not on file    Attends meetings of clubs or organizations: Not on file    Relationship status: Not on file  . Intimate partner violence    Fear of current or ex partner: Not on file    Emotionally abused: Not on file    Physically abused: Not on file    Forced sexual activity: Not on file  Other Topics Concern  . Not on file  Social History Narrative  . Not on file     No Known Allergies   Outpatient Medications Prior to Visit  Medication Sig Dispense Refill  . amLODipine (NORVASC) 10 MG tablet Take 1 tablet (10 mg total) by mouth daily. 90 tablet 3  . atorvastatin (LIPITOR) 20 MG tablet Take 1 tablet (20 mg total) by mouth daily. 90 tablet 3  . blood glucose meter kit and supplies KIT Dispense based on patient and insurance preference. Use up to four times daily as directed. (FOR ICD-9 250.00, 250.01). 1 each 0  . cephALEXin (KEFLEX) 500 MG capsule Take 1 capsule (500 mg total) by mouth every 6 (six) hours. 56 capsule 0  . docusate sodium (COLACE) 100 MG capsule Take 1 capsule (100 mg total) by mouth 2 (two) times daily. 10 capsule 0  . Dulaglutide (TRULICITY) 1.5 NL/8.9QJ SOPN Inject 1.5 mg into the skin once a week. 2 mL 2  . hydrochlorothiazide (MICROZIDE) 12.5 MG capsule Take 1 capsule (12.5 mg total) by mouth daily. 30 capsule 0  . ibuprofen (ADVIL) 600 MG tablet Take 1 tablet (600 mg total) by mouth every 8 (eight) hours as needed. 60 tablet 1  . Insulin Glargine (LANTUS SOLOSTAR) 100 UNIT/ML Solostar Pen Inject 30 Units into the skin daily. 5 pen 6  . Insulin Pen Needle 29G X 5MM MISC Use with lantus 100 each 0  . losartan (COZAAR) 100 MG tablet Take 1 tablet (100 mg total) by mouth daily. 30 tablet 0  . metFORMIN (GLUCOPHAGE) 500 MG tablet Take 2  tablets (1,000 mg total) by mouth 2 (two) times daily. 120 tablet 2  . polyethylene glycol (MIRALAX / GLYCOLAX) 17 g packet Take 17 g by mouth daily as needed for mild constipation. 14 each 0   No facility-administered medications prior to visit.     Review of Systems  Constitutional:   No  weight loss, night sweats,  Fevers, chills, fatigue, lassitude. HEENT:   No headaches,  Difficulty swallowing,  Tooth/dental problems,  Sore throat,                No sneezing,  itching, ear ache, nasal congestion, post nasal drip,   CV:  No chest pain,  Orthopnea, PND, swelling in lower extremities, anasarca, dizziness, palpitations  GI  No heartburn, indigestion, abdominal pain, nausea, vomiting, diarrhea, change in bowel habits, loss of appetite  Resp: No shortness of breath with exertion or at rest.  No excess mucus, no productive cough,  No non-productive cough,  No coughing up of blood.  No change in color of mucus.  No wheezing.  No chest wall deformity  Skin: no rash or lesions.  GU: no dysuria, change in color of urine, no urgency or frequency.  No flank pain.  MS:  No joint pain or swelling.  No decreased range of motion.  No back pain.  LLE PAIN IN CALF AREA  Psych:  No change in mood or affect. No depression or anxiety.  No memory loss.     Objective:   Physical Exam  There were no vitals filed for this visit.  Gen: Pleasant, well-nourished, in no distress,  normal affect  ENT: No lesions,  mouth clear,  oropharynx clear, no postnasal drip  Neck: No JVD, no TMG, no carotid bruits  Lungs: No use of accessory muscles, no dullness to percussion, clear without rales or rhonchi  Cardiovascular: RRR, heart sounds normal, no murmur or gallops, no peripheral edema  Abdomen: soft and NT, no HSM,  BS normal  Musculoskeletal: dressing over LLE Calf.  L foot  Less swollen.  L thigh normal   Neuro: alert, non focal  Skin: Warm, no lesions or rashes  FOOT EXAM:  Normal sensation and  pulses.  Toenail fungus and bunions  CBC Latest Ref Rng & Units 06/24/2019 06/12/2019 06/09/2019  WBC 3.4 - 10.8 x10E3/uL 9.4 10.7(H) 11.2(H)  Hemoglobin 13.0 - 17.7 g/dL 16.0 14.6 15.5  Hematocrit 37.5 - 51.0 % 47.9 43.9 46.9  Platelets 150 - 450 x10E3/uL 286 342 288   BMP Latest Ref Rng & Units 06/24/2019 06/14/2019 06/13/2019  Glucose 65 - 99 mg/dL 140(H) - -  BUN 6 - 20 mg/dL 17 - -  Creatinine 0.76 - 1.27 mg/dL 1.06 0.83 1.03  BUN/Creat Ratio 9 - 20 16 - -  Sodium 134 - 144 mmol/L 142 - -  Potassium 3.5 - 5.2 mmol/L 4.3 - -  Chloride 96 - 106 mmol/L 102 - -  CO2 20 - 29 mmol/L 21 - -  Calcium 8.7 - 10.2 mg/dL 9.7 - -  Lipid Panel     Component Value Date/Time   CHOL 169 06/24/2019 1052   TRIG 223 (H) 06/24/2019 1052   HDL 35 (L) 06/24/2019 1052   CHOLHDL 4.8 06/24/2019 1052   LDLCALC 89 06/24/2019 1052   cbg today 145      Assessment & Plan:  I personally reviewed all images and lab data in the [facility] system as well as any outside material available during this office visit and agree with the  radiology impressions.   No problem-specific Assessment & Plan notes found for this encounter.   There are no diagnoses linked to this encounter.Tetanus shot and flu vaccine were given at this visit and I recommended the patient receive an eye exam

## 2019-10-10 NOTE — Progress Notes (Deleted)
Subjective:    Patient ID: Anthony Hubbard, male    DOB: 11-Jul-1986, 33 y.o.   MRN: [id number]  32 y.o.M with type 2 diabetes and hypertension and recent admission for left lower extremity cellulitis and abscess with need for drainage per orthopedics  This is a follow-up visit for hypertension type 2 diabetes and recent admission for left lower extremity cellulitis and abscess with drainage.  The patient's lower extremity is improving and wound care has been ongoing per orthopedics.  Recent notes from orthopedics suggest the wound is slowly healing.  The patient still on Keflex for antibiotic coverage of gram-positive cocci from the cultures.  The patient maintains the Lantus and also now on Trulicity.  Blood sugars have been ranging in the lower area of 1 20-1 40 at home.  The patient is due tetanus vaccine and flu shot.  Patient also needs a foot exam as well as microalbumin check in the urine.  Note the patient has still some pain particular with dressing changes on the wound.   At last OV 06/2019:  Essential hypertension Blood pressure under good control we will continue Norvasc and hydrochlorthiazide for now  Superficial thrombosis of left lower extremity No evidence of progression of superficial thrombosis left lower extremity so we will hold off on anticoagulants  Hyperlipidemia associated with type 2 diabetes mellitus ([place]) LDL at 89 and will continue atorvastatin to continue to reach goal  Type 2 diabetes mellitus with hyperglycemia, with long-term current use of insulin (HCC) Improved glycemic control with Lantus and Trulicity along with metformin  Patient to follow-up with clinical pharmacist today as well as subsequent visit  Bilateral bunions Bilateral bunions on the great toe both feet therefore will refer to podiatry  Toenail fungus Bilateral toenail fungus both feet therefore will refer to podiatry  Cellulitis and abscess of left leg Cellulitis abscess  left lower extremity improving per orthopedics  I offered ibuprofen 600 mg tablet 3 times daily as needed for pain in place of high-dose aspirin   Has been to [facility] twice: Last ov 07/2019: Continue Lantus 30 units qhs. -Increase Metformin to 1048m BID -Increase Trulicity to 1.5 mg [place] once weekly once patient finishes 0.75 mg pens. -Extensively discussed pathophysiology of DM, recommended lifestyle interventions, dietary effects on glycemic control -Counseled on s/sx of and management of hypoglycemia -Microalbumin/creatinine ratio lab today in clinic. -Next A1C anticipated 08/2019.   ASCVD risk - primary prevention in patient with DM. Last LDL is controlled. Patient does not have multiple risk factors for ASCVD - Continue moderate intensity atorvastatin 24m   Hypertension longstanding currently controlled.  BP goal <130/80 mmHg. Patient is adherent with medication. -continue amlodipine, HCTZ and losartan   Past Medical History:  Diagnosis Date  . [place]   type 2   . Hypertension   . Hypertensive urgency 06/07/2019  . Phimosis      Family History  Problem Relation Age of Onset  . Diabetes Father   . Diabetes Paternal Uncle      Social History   Socioeconomic History  . Marital status: Married    Spouse name: Not on file  . Number of children: Not on file  . Years of education: Not on file  . Highest education level: Not on file  Occupational History  . Not on file  Social Needs  . Financial resource strain: Not on file  . Food insecurity    Worry: Not on file    Inability: Not  on file  . Transportation needs    Medical: Not on file    Non-medical: Not on file  Tobacco Use  . Smoking status: Never Smoker  . Smokeless tobacco: Never Used  Substance and Sexual Activity  . Alcohol use: No  . Drug use: Yes    Types: Marijuana  . Sexual activity: Not on file  Lifestyle  . Physical activity    Days per week: Not on file     Minutes per session: Not on file  . Stress: Not on file  Relationships  . Social Herbalist on phone: Not on file    Gets together: Not on file    Attends religious service: Not on file    Active member of club or organization: Not on file    Attends meetings of clubs or organizations: Not on file    Relationship status: Not on file  . Intimate partner violence    Fear of current or ex partner: Not on file    Emotionally abused: Not on file    Physically abused: Not on file    Forced sexual activity: Not on file  Other Topics Concern  . Not on file  Social History Narrative  . Not on file     No Known Allergies   Outpatient Medications Prior to Visit  Medication Sig Dispense Refill  . amLODipine (NORVASC) 10 MG tablet Take 1 tablet (10 mg total) by mouth daily. 90 tablet 3  . atorvastatin (LIPITOR) 20 MG tablet Take 1 tablet (20 mg total) by mouth daily. 90 tablet 3  . blood glucose meter kit and supplies KIT Dispense based on patient and insurance preference. Use up to four times daily as directed. (FOR ICD-9 250.00, 250.01). 1 each 0  . cephALEXin (KEFLEX) 500 MG capsule Take 1 capsule (500 mg total) by mouth every 6 (six) hours. 56 capsule 0  . docusate sodium (COLACE) 100 MG capsule Take 1 capsule (100 mg total) by mouth 2 (two) times daily. 10 capsule 0  . Dulaglutide (TRULICITY) 1.5 UX/3.2TF SOPN Inject 1.5 mg into the skin once a week. 2 mL 2  . hydrochlorothiazide (MICROZIDE) 12.5 MG capsule Take 1 capsule (12.5 mg total) by mouth daily. 30 capsule 0  . ibuprofen (ADVIL) 600 MG tablet Take 1 tablet (600 mg total) by mouth every 8 (eight) hours as needed. 60 tablet 1  . Insulin Glargine (LANTUS SOLOSTAR) 100 UNIT/ML Solostar Pen Inject 30 Units into the skin daily. 5 pen 6  . Insulin Pen Needle 29G X 5MM MISC Use with lantus 100 each 0  . losartan (COZAAR) 100 MG tablet Take 1 tablet (100 mg total) by mouth daily. 30 tablet 0  . metFORMIN (GLUCOPHAGE) 500 MG  tablet Take 2 tablets (1,000 mg total) by mouth 2 (two) times daily. 120 tablet 2  . polyethylene glycol (MIRALAX / GLYCOLAX) 17 g packet Take 17 g by mouth daily as needed for mild constipation. 14 each 0   No facility-administered medications prior to visit.     Review of Systems Constitutional:   No  weight loss, night sweats,  Fevers, chills, fatigue, lassitude. HEENT:   No headaches,  Difficulty swallowing,  Tooth/dental problems,  Sore throat,                No sneezing, itching, ear ache, nasal congestion, post nasal drip,   CV:  No chest pain,  Orthopnea, PND, swelling in lower extremities, anasarca, dizziness, palpitations  GI  No heartburn, indigestion, abdominal pain, nausea, vomiting, diarrhea, change in bowel habits, loss of appetite  Resp: No shortness of breath with exertion or at rest.  No excess mucus, no productive cough,  No non-productive cough,  No coughing up of blood.  No change in color of mucus.  No wheezing.  No chest wall deformity  Skin: no rash or lesions.  GU: no dysuria, change in color of urine, no urgency or frequency.  No flank pain.  MS:  No joint pain or swelling.  No decreased range of motion.  No back pain.  LLE PAIN IN CALF AREA  Psych:  No change in mood or affect. No depression or anxiety.  No memory loss.     Objective:   Physical Exam There were no vitals filed for this visit.  Gen: Pleasant, well-nourished, in no distress,  normal affect  ENT: No lesions,  mouth clear,  oropharynx clear, no postnasal drip  Neck: No JVD, no TMG, no carotid bruits  Lungs: No use of accessory muscles, no dullness to percussion, clear without rales or rhonchi  Cardiovascular: RRR, heart sounds normal, no murmur or gallops, no peripheral edema  Abdomen: soft and NT, no HSM,  BS normal  Musculoskeletal: dressing over LLE Calf.  L foot  Less swollen.  L thigh normal   Neuro: alert, non focal  Skin: Warm, no lesions or rashes  FOOT EXAM:  Normal  sensation and pulses.  Toenail fungus and bunions  CBC Latest Ref Rng & Units 06/24/2019 06/12/2019 06/09/2019  WBC 3.4 - 10.8 x10E3/uL 9.4 10.7(H) 11.2(H)  Hemoglobin 13.0 - 17.7 g/dL 16.0 14.6 15.5  Hematocrit 37.5 - 51.0 % 47.9 43.9 46.9  Platelets 150 - 450 x10E3/uL 286 342 288   BMP Latest Ref Rng & Units 06/24/2019 06/14/2019 06/13/2019  Glucose 65 - 99 mg/dL 140(H) - -  BUN 6 - 20 mg/dL 17 - -  Creatinine 0.76 - 1.27 mg/dL 1.06 0.83 1.03  BUN/Creat Ratio 9 - 20 16 - -  Sodium 134 - 144 mmol/L 142 - -  Potassium 3.5 - 5.2 mmol/L 4.3 - -  Chloride 96 - 106 mmol/L 102 - -  CO2 20 - 29 mmol/L 21 - -  Calcium 8.7 - 10.2 mg/dL 9.7 - -  Lipid Panel     Component Value Date/Time   CHOL 169 06/24/2019 1052   TRIG 223 (H) 06/24/2019 1052   HDL 35 (L) 06/24/2019 1052   CHOLHDL 4.8 06/24/2019 1052   LDLCALC 89 06/24/2019 1052   cbg today 145      Assessment & Plan:  I personally reviewed all images and lab data in the [facility] system as well as any outside material available during this office visit and agree with the  radiology impressions.   No problem-specific Assessment & Plan notes found for this encounter.   There are no diagnoses linked to this encounter.Tetanus shot and flu vaccine were given at this visit and I recommended the patient receive an eye exam

## 2019-10-12 ENCOUNTER — Ambulatory Visit: Payer: Self-pay | Admitting: Critical Care Medicine

## 2019-11-17 ENCOUNTER — Ambulatory Visit: Payer: Self-pay | Attending: Critical Care Medicine | Admitting: Critical Care Medicine

## 2019-11-17 ENCOUNTER — Encounter: Payer: Self-pay | Admitting: Critical Care Medicine

## 2019-11-17 ENCOUNTER — Other Ambulatory Visit: Payer: Self-pay

## 2019-11-17 VITALS — BP 145/91 | HR 68 | Temp 98.7°F | Ht 70.0 in | Wt 223.0 lb

## 2019-11-17 DIAGNOSIS — E781 Pure hyperglyceridemia: Secondary | ICD-10-CM

## 2019-11-17 DIAGNOSIS — L03116 Cellulitis of left lower limb: Secondary | ICD-10-CM

## 2019-11-17 DIAGNOSIS — Z794 Long term (current) use of insulin: Secondary | ICD-10-CM

## 2019-11-17 DIAGNOSIS — I10 Essential (primary) hypertension: Secondary | ICD-10-CM

## 2019-11-17 DIAGNOSIS — L02416 Cutaneous abscess of left lower limb: Secondary | ICD-10-CM

## 2019-11-17 DIAGNOSIS — E1165 Type 2 diabetes mellitus with hyperglycemia: Secondary | ICD-10-CM

## 2019-11-17 DIAGNOSIS — E785 Hyperlipidemia, unspecified: Secondary | ICD-10-CM

## 2019-11-17 DIAGNOSIS — E1169 Type 2 diabetes mellitus with other specified complication: Secondary | ICD-10-CM

## 2019-11-17 LAB — POCT GLYCOSYLATED HEMOGLOBIN (HGB A1C): Hemoglobin A1C: 11.6 % — AB (ref 4.0–5.6)

## 2019-11-17 LAB — GLUCOSE, POCT (MANUAL RESULT ENTRY): POC Glucose: 254 mg/dl — AB (ref 70–99)

## 2019-11-17 MED ORDER — METFORMIN HCL 500 MG PO TABS: 1000.0000 mg | ORAL_TABLET | Freq: Two times a day (BID) | ORAL | 2 refills | Status: DC

## 2019-11-17 MED ORDER — HYDROCHLOROTHIAZIDE 12.5 MG PO CAPS: 12.5000 mg | ORAL_CAPSULE | Freq: Every day | ORAL | 0 refills | Status: DC

## 2019-11-17 MED ORDER — ATORVASTATIN CALCIUM 40 MG PO TABS: 40.0000 mg | ORAL_TABLET | Freq: Every day | ORAL | 3 refills | Status: DC

## 2019-11-17 MED ORDER — TRULICITY 1.5 MG/0.5ML ~~LOC~~ SOAJ: 0.7500 mg | SUBCUTANEOUS | 4 refills | Status: DC

## 2019-11-17 MED ORDER — LOSARTAN POTASSIUM 100 MG PO TABS: 100.0000 mg | ORAL_TABLET | Freq: Every day | ORAL | 1 refills | Status: DC

## 2019-11-17 MED ORDER — AMLODIPINE BESYLATE 10 MG PO TABS: 10.0000 mg | ORAL_TABLET | Freq: Every day | ORAL | 3 refills | Status: DC

## 2019-11-17 MED ORDER — LANTUS SOLOSTAR 100 UNIT/ML ~~LOC~~ SOPN: 35.0000 [IU] | PEN_INJECTOR | Freq: Every day | SUBCUTANEOUS | 6 refills | Status: DC

## 2019-11-17 MED FILL — ?AMLODIPINE BESYLATE 10 MG: 10 | 30 days supply | Qty: 30 | Fill #0

## 2019-11-17 MED FILL — metFORMIN HCL 500 MG TABS: 500 | 30 days supply | Qty: 120 | Fill #0

## 2019-11-17 MED FILL — $LANTUS SOLOSTAR 100 UNITS/: 100 | 25 days supply | Qty: 9 | Fill #0

## 2019-11-17 MED FILL — LOSARTAN POTASSIUM 100 MG T: 100 | 30 days supply | Qty: 30 | Fill #0

## 2019-11-17 MED FILL — TRULICITY 0.75 MG/0.5 ML PE: 0.75 | 28 days supply | Qty: 2 | Fill #0

## 2019-11-17 MED FILL — HYDROCHLOROTHIAZIDE 12.5 MG: 12.5 | 30 days supply | Qty: 30 | Fill #0

## 2019-11-17 MED FILL — ?ATORVASTATIN 40MG TABLET: 40 | 30 days supply | Qty: 30 | Fill #0

## 2019-11-17 NOTE — Assessment & Plan Note (Signed)
As we are not yet at goal on lipids will increase Lipitor to 40 mg daily and check liver function

## 2019-11-17 NOTE — Patient Instructions (Addendum)
Increase Lantus to 35 units daily  Start on Metformin twice daily and Trulicity once a week  9.24 mg 1 pen  Increase atorvastatin to 40 mg daily  Remember to rotate sites on Lantus and Trulicity  Please take healthy snacks to work  Begin to increase the movement of your body  A clinical pharmacy visit will be made in the next 2 weeks and return to see Dr. Joya Gaskins 2 months  Labs today will be a metabolic panel

## 2019-11-17 NOTE — Progress Notes (Signed)
Subjective:    Patient ID: Anthony Hubbard, male    DOB: 09/19/1986, 33 y.o.   MRN: [id number]  32 y.o.M with type 2 diabetes and hypertension and recent admission for left lower extremity cellulitis and abscess with need for drainage per orthopedics  This is a follow-up visit for hypertension type 2 diabetes and recent admission for left lower extremity cellulitis and abscess with drainage.  The patient's lower extremity is improving and wound care has been ongoing per orthopedics.  Recent notes from orthopedics suggest the wound is slowly healing.  The patient still on Keflex for antibiotic coverage of gram-positive cocci from the cultures.  The patient maintains the Lantus and also now on Trulicity.  Blood sugars have been ranging in the lower area of 1 20-1 40 at home.  The patient is due tetanus vaccine and flu shot.  Patient also needs a foot exam as well as microalbumin check in the urine.  Note the patient has still some pain particular with dressing changes on the wound.  11/17/2019 This is a follow-up visit from a September 2020 visit.  The patient is being treated for type 2 diabetes and hyperlipidemia.  Patient also had cellulitis and abscess of the left leg.  The cellulitis and abscess have resolved and the wound is healed.  The patient is now off antibiotics.  The patient is being followed for diabetes and hypertension.  Noted home he states his blood pressures in the 140/80 range.  His blood sugars have been in the 1 20-1 40 range fasting and then get up to mid 200s postprandial  He has not been compliant with his diet.  Note today's hemoglobin A1c is 11 and his CBG is 260  The patient tends to eat a lot in different times and does not take snacks that are healthy in for him between meals but instead eats unhealthy snacks  Past Medical History:  Diagnosis Date  . Diabetes mellitus without complication ([place])    type 2   . Hypertension   . Hypertensive urgency 06/07/2019  .  Phimosis      Family History  Problem Relation Age of Onset  . Diabetes Father   . Diabetes Paternal Uncle      Social History   Socioeconomic History  . Marital status: Married    Spouse name: Not on file  . Number of children: Not on file  . Years of education: Not on file  . Highest education level: Not on file  Occupational History  . Not on file  Tobacco Use  . Smoking status: Never Smoker  . Smokeless tobacco: Never Used  Substance and Sexual Activity  . Alcohol use: No  . Drug use: Yes    Types: Marijuana  . Sexual activity: Not on file  Other Topics Concern  . Not on file  Social History Narrative  . Not on file   Social Determinants of Health   Financial Resource Strain:   . Difficulty of Paying Living Expenses: Not on file  Food Insecurity:   . Worried About Charity fundraiser in the Last Year: Not on file  . Ran Out of Food in the Last Year: Not on file  Transportation Needs:   . Lack of Transportation (Medical): Not on file  . Lack of Transportation (Non-Medical): Not on file  Physical Activity:   . Days of Exercise per Week: Not on file  . Minutes of Exercise per Session: Not on file  Stress:   .  Feeling of Stress : Not on file  Social Connections:   . Frequency of Communication with Friends and Family: Not on file  . Frequency of Social Gatherings with Friends and Family: Not on file  . Attends Religious Services: Not on file  . Active Member of Clubs or Organizations: Not on file  . Attends Archivist Meetings: Not on file  . Marital Status: Not on file  Intimate Partner Violence:   . Fear of Current or Ex-Partner: Not on file  . Emotionally Abused: Not on file  . Physically Abused: Not on file  . Sexually Abused: Not on file     No Known Allergies   Outpatient Medications Prior to Visit  Medication Sig Dispense Refill  . blood glucose meter kit and supplies KIT Dispense based on patient and insurance preference. Use up to  four times daily as directed. (FOR ICD-9 250.00, 250.01). 1 each 0  . docusate sodium (COLACE) 100 MG capsule Take 1 capsule (100 mg total) by mouth 2 (two) times daily. 10 capsule 0  . ibuprofen (ADVIL) 600 MG tablet Take 1 tablet (600 mg total) by mouth every 8 (eight) hours as needed. 60 tablet 1  . Insulin Pen Needle 29G X 5MM MISC Use with lantus 100 each 0  . polyethylene glycol (MIRALAX / GLYCOLAX) 17 g packet Take 17 g by mouth daily as needed for mild constipation. 14 each 0  . amLODipine (NORVASC) 10 MG tablet Take 1 tablet (10 mg total) by mouth daily. 90 tablet 3  . atorvastatin (LIPITOR) 20 MG tablet Take 1 tablet (20 mg total) by mouth daily. 90 tablet 3  . Dulaglutide (TRULICITY) 1.5 WH/6.7RF SOPN Inject 1.5 mg into the skin once a week. (Patient taking differently: Inject 0.75 mg into the skin once a week. ) 2 mL 2  . hydrochlorothiazide (MICROZIDE) 12.5 MG capsule Take 1 capsule (12.5 mg total) by mouth daily. 30 capsule 0  . Insulin Glargine (LANTUS SOLOSTAR) 100 UNIT/ML Solostar Pen Inject 30 Units into the skin daily. 5 pen 6  . losartan (COZAAR) 100 MG tablet Take 1 tablet (100 mg total) by mouth daily. 30 tablet 0  . metFORMIN (GLUCOPHAGE) 500 MG tablet Take 2 tablets (1,000 mg total) by mouth 2 (two) times daily. 120 tablet 2  . cephALEXin (KEFLEX) 500 MG capsule Take 1 capsule (500 mg total) by mouth every 6 (six) hours. (Patient not taking: Reported on 11/17/2019) 56 capsule 0   No facility-administered medications prior to visit.    Review of Systems  Constitutional:   No  weight loss, night sweats,  Fevers, chills, fatigue, lassitude. HEENT:   No headaches,  Difficulty swallowing,  Tooth/dental problems,  Sore throat,                No sneezing, itching, ear ache, nasal congestion, post nasal drip,   CV:  No chest pain,  Orthopnea, PND, swelling in lower extremities, anasarca, dizziness, palpitations  GI  No heartburn, indigestion, abdominal pain, nausea, vomiting,  diarrhea, change in bowel habits, loss of appetite  Resp: No shortness of breath with exertion or at rest.  No excess mucus, no productive cough,  No non-productive cough,  No coughing up of blood.  No change in color of mucus.  No wheezing.  No chest wall deformity  Skin: no rash or lesions.  GU: no dysuria, change in color of urine, no urgency or frequency.  No flank pain.  MS:  No joint pain or  swelling.  No decreased range of motion.  No back pain.  Psych:  No change in mood or affect. No depression or anxiety.  No memory loss.     Objective:   Physical Exam There were no vitals taken for this visit.  Gen: Pleasant, well-nourished, in no distress,  normal affect  ENT: No lesions,  mouth clear,  oropharynx clear, no postnasal drip  Neck: No JVD, no TMG, no carotid bruits  Lungs: No use of accessory muscles, no dullness to percussion, clear without rales or rhonchi  Cardiovascular: RRR, heart sounds normal, no murmur or gallops, no peripheral edema  Abdomen: soft and NT, no HSM,  BS normal  Musculoskeletal: dressing over LLE Calf.  L foot  Less swollen.  L thigh normal   Neuro: alert, non focal  Skin: Warm, no lesions or rashes   CBC Latest Ref Rng & Units 06/24/2019 06/12/2019 06/09/2019  WBC 3.4 - 10.8 x10E3/uL 9.4 10.7(H) 11.2(H)  Hemoglobin 13.0 - 17.7 g/dL 16.0 14.6 15.5  Hematocrit 37.5 - 51.0 % 47.9 43.9 46.9  Platelets 150 - 450 x10E3/uL 286 342 288   BMP Latest Ref Rng & Units 06/24/2019 06/14/2019 06/13/2019  Glucose 65 - 99 mg/dL 140(H) - -  BUN 6 - 20 mg/dL 17 - -  Creatinine 0.76 - 1.27 mg/dL 1.06 0.83 1.03  BUN/Creat Ratio 9 - 20 16 - -  Sodium 134 - 144 mmol/L 142 - -  Potassium 3.5 - 5.2 mmol/L 4.3 - -  Chloride 96 - 106 mmol/L 102 - -  CO2 20 - 29 mmol/L 21 - -  Calcium 8.7 - 10.2 mg/dL 9.7 - -  Lipid Panel     Component Value Date/Time   CHOL 169 06/24/2019 1052   TRIG 223 (H) 06/24/2019 1052   HDL 35 (L) 06/24/2019 1052   CHOLHDL 4.8 06/24/2019  1052   LDLCALC 89 06/24/2019 1052   Lab Results  Component Value Date   HGBA1C 11.6 (A) 11/17/2019   cbg today 254  A1C >11      Assessment & Plan:  I personally reviewed all images and lab data in the [facility] system as well as any outside material available during this office visit and agree with the  radiology impressions.   Essential hypertension For now will continue amlodipine 10 mg daily and hydrochlorthiazide 12.5 along with losartan 100 mg daily  Hyperlipidemia associated with type 2 diabetes mellitus ([place]) As we are not yet at goal on lipids will increase Lipitor to 40 mg daily and check liver function  Type 2 diabetes mellitus with hyperglycemia, with long-term current use of insulin ([place]) Poorly controlled diabetes due to dietary noncompliance and increased insulin resistance with type 2 diabetes  I went over with the patient appropriate diet  I asked the patient to change to the Trulicity at 1.21 mcg weekly and asked him to rotate injection sites  Lantus will be increased to 35 units weekly and rotate injection sites  The patient to continue Metformin 1000 mg twice daily  We will connect with clinical pharmacist in the next 2 weeks  Cellulitis and abscess of left leg Cellulitis has resolved  Hypertriglyceridemia Modest elevation in triglycerides have asked the patient to follow-up on a low-fat diet   Diagnoses and all orders for this visit:  Type 2 diabetes mellitus with hyperglycemia, with long-term current use of insulin ([facility]) -     HgB A1c -     Glucose (CBG) -  Dulaglutide (TRULICITY) 1.5 JT/7.0VX SOPN; Inject 0.75 mg into the skin once a week. -     metFORMIN (GLUCOPHAGE) 500 MG tablet; Take 2 tablets (1,000 mg total) by mouth 2 (two) times daily. -     Cancel: Basic Metabolic Panel -     Comprehensive metabolic panel  Essential hypertension  Hyperlipidemia associated with type 2 diabetes mellitus ([place]) -     Comprehensive metabolic  panel  Cellulitis and abscess of left leg  Hypertriglyceridemia  Other orders -     amLODipine (NORVASC) 10 MG tablet; Take 1 tablet (10 mg total) by mouth daily. -     atorvastatin (LIPITOR) 40 MG tablet; Take 1 tablet (40 mg total) by mouth daily. -     hydrochlorothiazide (MICROZIDE) 12.5 MG capsule; Take 1 capsule (12.5 mg total) by mouth daily. -     Insulin Glargine (LANTUS SOLOSTAR) 100 UNIT/ML Solostar Pen; Inject 35 Units into the skin daily. -     losartan (COZAAR) 100 MG tablet; Take 1 tablet (100 mg total) by mouth daily.

## 2019-11-17 NOTE — Assessment & Plan Note (Signed)
Poorly controlled diabetes due to dietary noncompliance and increased insulin resistance with type 2 diabetes  I went over with the patient appropriate diet  I asked the patient to change to the Trulicity at 0.97 mcg weekly and asked him to rotate injection sites  Lantus will be increased to 35 units weekly and rotate injection sites  The patient to continue Metformin 1000 mg twice daily  We will connect with clinical pharmacist in the next 2 weeks

## 2019-11-17 NOTE — Assessment & Plan Note (Signed)
Cellulitis has resolved. 

## 2019-11-17 NOTE — Assessment & Plan Note (Signed)
For now will continue amlodipine 10 mg daily and hydrochlorthiazide 12.5 along with losartan 100 mg daily

## 2019-11-17 NOTE — Assessment & Plan Note (Signed)
Modest elevation in triglycerides have asked the patient to follow-up on a low-fat diet

## 2019-11-18 ENCOUNTER — Telehealth: Payer: Self-pay

## 2019-11-18 ENCOUNTER — Telehealth: Payer: Self-pay | Admitting: Critical Care Medicine

## 2019-11-18 LAB — COMPREHENSIVE METABOLIC PANEL
ALT: 25 IU/L (ref 0–44)
AST: 15 IU/L (ref 0–40)
Albumin/Globulin Ratio: 1.8 (ref 1.2–2.2)
Albumin: 4.3 g/dL (ref 4.0–5.0)
Alkaline Phosphatase: 116 IU/L (ref 39–117)
BUN/Creatinine Ratio: 11 (ref 9–20)
BUN: 12 mg/dL (ref 6–20)
Bilirubin Total: 0.4 mg/dL (ref 0.0–1.2)
CO2: 26 mmol/L (ref 20–29)
Calcium: 9.4 mg/dL (ref 8.7–10.2)
Chloride: 98 mmol/L (ref 96–106)
Creatinine, Ser: 1.06 mg/dL (ref 0.76–1.27)
GFR calc Af Amer: 108 mL/min/{1.73_m2} (ref >59)
GFR calc non Af Amer: 93 mL/min/{1.73_m2} (ref >59)
Globulin, Total: 2.4 g/dL (ref 1.5–4.5)
Glucose: 315 mg/dL — ABNORMAL HIGH (ref 65–99)
Potassium: 4.4 mmol/L (ref 3.5–5.2)
Sodium: 135 mmol/L (ref 134–144)
Total Protein: 6.7 g/dL (ref 6.0–8.5)

## 2019-11-18 NOTE — Telephone Encounter (Signed)
Patient returned phone call and was informed of lab results. 

## 2019-11-18 NOTE — Telephone Encounter (Signed)
-----   Message from Elsie Stain, MD sent at 11/18/2019 11:03 AM EST ----- Let pt know his metabolic panel is normal except for the high blood sugar.  Kidney and liver normal

## 2019-11-18 NOTE — Telephone Encounter (Signed)
Pt called to request her lab results and call was forwarded to [place], pt understood and had no further questions or concerns

## 2019-11-23 ENCOUNTER — Other Ambulatory Visit: Payer: Self-pay | Admitting: Critical Care Medicine

## 2019-12-01 ENCOUNTER — Ambulatory Visit: Payer: Self-pay | Attending: Family Medicine | Admitting: Pharmacist

## 2019-12-01 ENCOUNTER — Other Ambulatory Visit: Payer: Self-pay

## 2019-12-01 DIAGNOSIS — Z794 Long term (current) use of insulin: Secondary | ICD-10-CM

## 2019-12-01 DIAGNOSIS — E1165 Type 2 diabetes mellitus with hyperglycemia: Secondary | ICD-10-CM

## 2019-12-01 NOTE — Progress Notes (Signed)
   S:    PCP: Dr. Delford Field   No chief complaint on file.  Patient arrives good spirits.  Presents for diabetes evaluation, education, and management Patient was referred and last seen by Primary Care Provider on 11/17/19   Patient reports diabetes was diagnosed several years ago.  Family/Social History: DM (father); never smoker, denies alcohol intake.  Insurance coverage/medication affordability: self-pay   Patient denies adherence with medications.  Current diabetes medications include: metformin 1000 mg BID, Lantus 35 units daily, Trulicity 1.5 mg once weekly (not taking) Current hypertension medications include: amlodipine 10 mg daily, HCTZ 12.5 mg daily, losartan 100 mg daily  Current hyperlipidemia medications include: atorvastatin 40 mg daily   Patient denies hypoglycemic events.  Patient reported dietary habits: eats fish and vegetables, everything cooked in the oven, no oils. Drinks water and green tea w/o sugar. Patient-reported exercise habits: walks around apartment complex for ~1hr every day.   Patient denies nocturia.  Patient denies neuropathy. Patient denies visual changes. Patient reports self foot exams.     O:   A1c: 11.6 (11/17/19)  There were no vitals filed for this visit.  Lipid Panel: las LDL 89   Home fasting CBG: 110-150. Per patient most sugars stay in 130s.   2 hour post-prandial/random CBG: 120-200s. Per patient most sugars stay in 180s.  Clinical ASCVD: No  The ASCVD Risk score Denman George DC Jr., et al., 2013) failed to calculate for the following reasons:   The 2013 ASCVD risk score is only valid for ages 58 to 21   A/P: Diabetes longstanding currently uncontrolled based on A1c but home CBGs are improving. Patient is able to verbalize appropriate hypoglycemia management plan. Patient is not adherent with Trulicity as he wanted to make sure he can inject in the thigh. Injection technique reviewed; pt is amenable to restart the Trulicity.    -Continue current regimen.  -Extensively discussed pathophysiology of DM, recommended lifestyle interventions, dietary effects on glycemic control -Counseled on s/sx of and management of hypoglycemia -Next A1C anticipated 08/2019.   Written patient instructions provided.  Total time in face to face counseling 30 minutes.   Follow up with me 12/29/2019.  Butch Penny, PharmD, CPP Clinical Pharmacist [facility] & [facility] [phone]

## 2019-12-29 ENCOUNTER — Other Ambulatory Visit: Payer: Self-pay

## 2019-12-29 ENCOUNTER — Ambulatory Visit: Payer: Self-pay | Attending: Family Medicine | Admitting: Pharmacist

## 2019-12-29 DIAGNOSIS — E1165 Type 2 diabetes mellitus with hyperglycemia: Secondary | ICD-10-CM

## 2019-12-29 DIAGNOSIS — Z794 Long term (current) use of insulin: Secondary | ICD-10-CM

## 2019-12-29 LAB — POCT URINALYSIS DIP (CLINITEK)
Bilirubin, UA: NEGATIVE
Blood, UA: NEGATIVE
Glucose, UA: 500 mg/dL — AB
Leukocytes, UA: NEGATIVE
Nitrite, UA: NEGATIVE
POC PROTEIN,UA: NEGATIVE
Spec Grav, UA: 1.02 (ref 1.010–1.025)
Urobilinogen, UA: 2 E.U./dL — AB
pH, UA: 7 (ref 5.0–8.0)

## 2019-12-29 LAB — GLUCOSE, POCT (MANUAL RESULT ENTRY): POC Glucose: 470 mg/dl — AB (ref 70–99)

## 2019-12-29 MED ORDER — NOVOLOG FLEXPEN 100 UNIT/ML ~~LOC~~ SOPN: 8.0000 [IU] | PEN_INJECTOR | Freq: Three times a day (TID) | SUBCUTANEOUS | 0 refills | Status: DC

## 2019-12-29 NOTE — Progress Notes (Signed)
   S:    PCP: Dr. Delford Field   No chief complaint on file.  Patient arrives good spirits.  Presents for diabetes evaluation, education, and management Patient was referred and last seen by Primary Care Provider on 11/17/19.  Patient reports diabetes was diagnosed several years ago.  Family/Social History: DM (father); never smoker, denies alcohol intake.  Insurance coverage/medication affordability: self-pay   Patient denies adherence with medications.  Current diabetes medications include: metformin 1000 mg BID, Lantus 35 units daily, Trulicity 1.5 mg once weekly (not taking) Current hypertension medications include: amlodipine 10 mg daily, HCTZ 12.5 mg daily, losartan 100 mg daily  Current hyperlipidemia medications include: atorvastatin 40 mg daily   Patient denies hypoglycemic events.  Patient reported dietary habits: eats fish and vegetables, everything cooked in the oven, no oils. Drinks water and green tea w/o sugar. Patient-reported exercise habits: walks around apartment complex for ~1hr every day.   Patient denies nocturia.  Patient denies neuropathy. Patient denies visual changes. Patient reports self foot exams.     O:  POCT glucose: 494 10 units of Novolog given in clinic  2nd POCT glucose: 486 10 more units of Novolog given in clinic  3rd POCT glucose: 493 8 units given   A1c: 11.6 (11/17/19)  There were no vitals filed for this visit.  Lipid Panel: last LDL 89   Home fasting CBG: 110-150. Per patient most sugars stay in 130s.   2 hour post-prandial/random CBG: 120-200s. Per patient most sugars stay in 180s.  Clinical ASCVD: No  The ASCVD Risk score Denman George DC Jr., et al., 2013) failed to calculate for the following reasons:   The 2013 ASCVD risk score is only valid for ages 58 to 49   A/P: Diabetes longstanding currently uncontrolled based on A1c but home CBGs are improving. Patient is able to verbalize appropriate hypoglycemia management plan.    Patient with hyperglycemia today. Given a total of 28 units of Novolog with minimal improvement. PCP consulted. Will have patient begin mealtime insulin at home and return Thursday.    Patient is not adherent with Trulicity as he wanted to make sure he can inject in the thigh. Will add Novolog 8 units TID.  -Lantus 35 units daily. -Start Trulicity 1.5 mg weekly.  -Start Novolog 8 units TID. -Extensively discussed pathophysiology of DM, recommended lifestyle interventions, dietary effects on glycemic control -Counseled on s/sx of and management of hypoglycemia -Next A1C anticipated 01/2020.   Written patient instructions provided.  Total time in face to face counseling 30 minutes.   Follow up with me 12/29/2019.  Butch Penny, PharmD, CPP Clinical Pharmacist [facility] & [facility] [phone]

## 2019-12-30 LAB — GLUCOSE, POCT (MANUAL RESULT ENTRY): POC Glucose: 486 mg/dl — AB (ref 70–99)

## 2019-12-30 MED ORDER — INSULIN ASPART 100 UNIT/ML ~~LOC~~ SOLN: 8.0000 [IU] | Freq: Once | SUBCUTANEOUS | Status: DC

## 2019-12-30 MED ORDER — INSULIN ASPART 100 UNIT/ML ~~LOC~~ SOLN: 10.0000 [IU] | Freq: Once | SUBCUTANEOUS | Status: DC

## 2019-12-30 MED ORDER — INSULIN ASPART 100 UNIT/ML ~~LOC~~ SOLN
10.0000 [IU] | Freq: Once | SUBCUTANEOUS | Status: AC
  Administered 2019-12-30: 16:00:00 10 [IU] via SUBCUTANEOUS

## 2019-12-31 ENCOUNTER — Ambulatory Visit: Payer: Self-pay | Admitting: Pharmacist

## 2019-12-31 ENCOUNTER — Other Ambulatory Visit: Payer: Self-pay

## 2019-12-31 ENCOUNTER — Ambulatory Visit: Payer: Self-pay | Attending: Critical Care Medicine | Admitting: Pharmacist

## 2019-12-31 DIAGNOSIS — Z794 Long term (current) use of insulin: Secondary | ICD-10-CM

## 2019-12-31 DIAGNOSIS — E1165 Type 2 diabetes mellitus with hyperglycemia: Secondary | ICD-10-CM

## 2019-12-31 MED ORDER — INSULIN LISPRO (1 UNIT DIAL) 100 UNIT/ML (KWIKPEN): 8.0000 [IU] | PEN_INJECTOR | Freq: Three times a day (TID) | SUBCUTANEOUS | 0 refills | Status: DC

## 2019-12-31 NOTE — Progress Notes (Signed)
   S:    PCP: Dr. Delford Field   No chief complaint on file.  Patient arrives good spirits.  Presents for diabetes evaluation, education, and management Patient was referred and last seen by Primary Care Provider on 11/17/19.  Patient reports diabetes was diagnosed several years ago.  Family/Social History: DM (father); never smoker, denies alcohol intake.  Insurance coverage/medication affordability: self-pay   Patient denies adherence with medications.  Current diabetes medications include: metformin 1000 mg BID, Lantus 35 units daily (reports taking 45), Trulicity 1.5 mg once weekly (not taking) Current hypertension medications include: amlodipine 10 mg daily, HCTZ 12.5 mg daily, losartan 100 mg daily  Current hyperlipidemia medications include: atorvastatin 40 mg daily   Patient denies hypoglycemic events.  Patient reported dietary habits: eats fish and vegetables, everything cooked in the oven, no oils. Drinks water and green tea w/o sugar. Patient-reported exercise habits: walks around apartment complex for ~1hr every day.   Patient denies nocturia.  Patient denies neuropathy. Patient denies visual changes. Patient reports self foot exams.     O:  POCT glucose: 358  A1c: 11.6 (11/17/19)  There were no vitals filed for this visit.  Lipid Panel: last LDL 89   Home fasting CBG: 110-150. Per patient most sugars stay in 130s.   2 hour post-prandial/random CBG: 120-200s. Per patient most sugars stay in 180s.  Clinical ASCVD: No  The ASCVD Risk score Denman George DC Jr., et al., 2013) failed to calculate for the following reasons:   The 2013 ASCVD risk score is only valid for ages 56 to 108   A/P: Diabetes longstanding currently uncontrolled based on A1c but home CBGs are improving. Patient is able to verbalize appropriate hypoglycemia management plan.   Patient with hyperglycemia today and at home. Will increase Lantus to 55 units daily.    -Increase Lantus 55 units  daily. -Continue Trulicity 1.5 mg weekly.  -Continue Novolog 8 units TID. -Extensively discussed pathophysiology of DM, recommended lifestyle interventions, dietary effects on glycemic control -Counseled on s/sx of and management of hypoglycemia -Next A1C anticipated 01/2020.   Written patient instructions provided.  Total time in face to face counseling 30 minutes.   Follow up with Dr. Delford Field in 2 weeks.   Butch Penny, PharmD, CPP Clinical Pharmacist [facility] & [facility] [phone]

## 2020-01-01 ENCOUNTER — Other Ambulatory Visit: Payer: Self-pay | Admitting: Pharmacist

## 2020-01-01 DIAGNOSIS — Z794 Long term (current) use of insulin: Secondary | ICD-10-CM

## 2020-01-01 DIAGNOSIS — E1165 Type 2 diabetes mellitus with hyperglycemia: Secondary | ICD-10-CM

## 2020-01-01 MED ORDER — NOVOLOG FLEXPEN 100 UNIT/ML ~~LOC~~ SOPN: 8.0000 [IU] | PEN_INJECTOR | Freq: Three times a day (TID) | SUBCUTANEOUS | 0 refills | Status: DC

## 2020-01-01 MED ORDER — OZEMPIC (0.25 OR 0.5 MG/DOSE) 2 MG/1.5ML ~~LOC~~ SOPN: 0.5000 mg | PEN_INJECTOR | SUBCUTANEOUS | 1 refills | Status: DC

## 2020-01-01 MED ORDER — LANTUS SOLOSTAR 100 UNIT/ML ~~LOC~~ SOPN: 55.0000 [IU] | PEN_INJECTOR | Freq: Every day | SUBCUTANEOUS | 6 refills | Status: DC

## 2020-01-01 NOTE — Progress Notes (Signed)
Pt called the office today. Briefly, he used to take Ozempic and reports better tolerability with this vs Trulicity. Additionally, he reports better glycemic control with the Ozempic. I will send Ozempic to replace the Trulicity. Pt verbalizes understanding.

## 2020-01-12 ENCOUNTER — Ambulatory Visit: Payer: Self-pay | Attending: Critical Care Medicine | Admitting: Critical Care Medicine

## 2020-01-12 ENCOUNTER — Other Ambulatory Visit: Payer: Self-pay

## 2020-01-12 ENCOUNTER — Other Ambulatory Visit: Payer: Self-pay | Admitting: Pharmacist

## 2020-01-12 ENCOUNTER — Encounter: Payer: Self-pay | Admitting: Critical Care Medicine

## 2020-01-12 VITALS — BP 155/101 | HR 89 | Temp 97.5°F | Resp 18 | Ht 69.0 in | Wt 241.0 lb

## 2020-01-12 DIAGNOSIS — Z794 Long term (current) use of insulin: Secondary | ICD-10-CM

## 2020-01-12 DIAGNOSIS — I10 Essential (primary) hypertension: Secondary | ICD-10-CM

## 2020-01-12 DIAGNOSIS — E1169 Type 2 diabetes mellitus with other specified complication: Secondary | ICD-10-CM

## 2020-01-12 DIAGNOSIS — E785 Hyperlipidemia, unspecified: Secondary | ICD-10-CM

## 2020-01-12 DIAGNOSIS — E1165 Type 2 diabetes mellitus with hyperglycemia: Secondary | ICD-10-CM

## 2020-01-12 LAB — GLUCOSE, POCT (MANUAL RESULT ENTRY): POC Glucose: 200 mg/dl — AB (ref 70–99)

## 2020-01-12 MED ORDER — NOVOLOG FLEXPEN 100 UNIT/ML ~~LOC~~ SOPN: 8.0000 [IU] | PEN_INJECTOR | Freq: Three times a day (TID) | SUBCUTANEOUS | 0 refills | Status: DC

## 2020-01-12 MED ORDER — NOVOLOG FLEXPEN 100 UNIT/ML ~~LOC~~ SOPN: 11.0000 [IU] | PEN_INJECTOR | Freq: Three times a day (TID) | SUBCUTANEOUS | 1 refills | Status: DC

## 2020-01-12 NOTE — Assessment & Plan Note (Signed)
Patient to continue atorvastatin daily for his cholesterol and diabetes

## 2020-01-12 NOTE — Progress Notes (Signed)
Subjective:    Patient ID: Anthony Hubbard, male    DOB: Aug 25, 1986, 34 y.o.   MRN: [id number]  34 y.o.M with type 2 diabetes and hypertension and recent admission for left lower extremity cellulitis and abscess with need for drainage per orthopedics  This is a follow-up visit for hypertension type 2 diabetes and recent admission for left lower extremity cellulitis and abscess with drainage.  The patient's lower extremity is improving and wound care has been ongoing per orthopedics.  Recent notes from orthopedics suggest the wound is slowly healing.  The patient still on Keflex for antibiotic coverage of gram-positive cocci from the cultures.  The patient maintains the Lantus and also now on Trulicity.  Blood sugars have been ranging in the lower area of 1 20-1 40 at home.  The patient is due tetanus vaccine and flu shot.  Patient also needs a foot exam as well as microalbumin check in the urine.  Note the patient has still some pain particular with dressing changes on the wound.  11/17/2019 This is a follow-up visit from a September 2020 visit.  The patient is being treated for type 2 diabetes and hyperlipidemia.  Patient also had cellulitis and abscess of the left leg.  The cellulitis and abscess have resolved and the wound is healed.  The patient is now off antibiotics.  The patient is being followed for diabetes and hypertension.  Noted home he states his blood pressures in the 140/80 range.  His blood sugars have been in the 1 20-1 40 range fasting and then get up to mid 200s postprandial  He has not been compliant with his diet.  Note today's hemoglobin A1c is 11 and his CBG is 260  The patient tends to eat a lot in different times and does not take snacks that are healthy in for him between meals but instead eats unhealthy snacks  01/12/2020 The patient returns today for follow-up and continues to run high sugars although not as high as previous visits.  His fastings were in the 200  range postprandial 300.  Previously he was fasting in the 450 range.  He is not been able to access his NovoLog due to insurance barriers.  He is on the Lantus at 55 units daily and is on Metformin at 1000 mg twice daily as well now is on Ozempic 0.5 weekly  Blood pressure showing some improved control  We have been giving the patient counseling with regards to his diet   There are no other new complaints   Past Medical History:  Diagnosis Date  . Diabetes mellitus without complication ([place])    type 2   . Hypertension   . Hypertensive urgency 06/07/2019  . Phimosis      Family History  Problem Relation Age of Onset  . Diabetes Father   . Diabetes Paternal Uncle      Social History   Socioeconomic History  . Marital status: Married    Spouse name: Not on file  . Number of children: Not on file  . Years of education: Not on file  . Highest education level: Not on file  Occupational History  . Not on file  Tobacco Use  . Smoking status: Never Smoker  . Smokeless tobacco: Never Used  Substance and Sexual Activity  . Alcohol use: No  . Drug use: Yes    Types: Marijuana  . Sexual activity: Not on file  Other Topics Concern  . Not on file  Social History  Narrative  . Not on file   Social Determinants of Health   Financial Resource Strain:   . Difficulty of Paying Living Expenses: Not on file  Food Insecurity:   . Worried About Charity fundraiser in the Last Year: Not on file  . Ran Out of Food in the Last Year: Not on file  Transportation Needs:   . Lack of Transportation (Medical): Not on file  . Lack of Transportation (Non-Medical): Not on file  Physical Activity:   . Days of Exercise per Week: Not on file  . Minutes of Exercise per Session: Not on file  Stress:   . Feeling of Stress : Not on file  Social Connections:   . Frequency of Communication with Friends and Family: Not on file  . Frequency of Social Gatherings with Friends and Family: Not on file  .  Attends Religious Services: Not on file  . Active Member of Clubs or Organizations: Not on file  . Attends Archivist Meetings: Not on file  . Marital Status: Not on file  Intimate Partner Violence:   . Fear of Current or Ex-Partner: Not on file  . Emotionally Abused: Not on file  . Physically Abused: Not on file  . Sexually Abused: Not on file     No Known Allergies   Outpatient Medications Prior to Visit  Medication Sig Dispense Refill  . amLODipine (NORVASC) 10 MG tablet Take 1 tablet (10 mg total) by mouth daily. 90 tablet 3  . atorvastatin (LIPITOR) 40 MG tablet Take 1 tablet (40 mg total) by mouth daily. 30 tablet 3  . blood glucose meter kit and supplies KIT Dispense based on patient and insurance preference. Use up to four times daily as directed. (FOR ICD-9 250.00, 250.01). 1 each 0  . hydrochlorothiazide (MICROZIDE) 12.5 MG capsule Take 1 capsule (12.5 mg total) by mouth daily. 30 capsule 0  . ibuprofen (ADVIL) 600 MG tablet Take 1 tablet (600 mg total) by mouth every 8 (eight) hours as needed. 60 tablet 1  . Insulin Glargine (LANTUS SOLOSTAR) 100 UNIT/ML Solostar Pen Inject 55 Units into the skin daily. 5 pen 6  . losartan (COZAAR) 100 MG tablet Take 1 tablet (100 mg total) by mouth daily. 90 tablet 1  . metFORMIN (GLUCOPHAGE) 500 MG tablet Take 2 tablets (1,000 mg total) by mouth 2 (two) times daily. 120 tablet 2  . polyethylene glycol (MIRALAX / GLYCOLAX) 17 g packet Take 17 g by mouth daily as needed for mild constipation. 14 each 0  . Semaglutide,0.25 or 0.5MG/DOS, (OZEMPIC, 0.25 OR 0.5 MG/DOSE,) 2 MG/1.5ML SOPN Inject 0.5 mg into the skin once a week. 3 pen 1  . TRUEPLUS 5-BEVEL PEN NEEDLES 31G X 5 MM MISC USE AS DIRECTED FOR LANTUS 100 each 0  . insulin aspart (NOVOLOG FLEXPEN) 100 UNIT/ML FlexPen Inject 8 Units into the skin 3 (three) times daily with meals. 30 mL 0  . docusate sodium (COLACE) 100 MG capsule Take 1 capsule (100 mg total) by mouth 2 (two) times  daily. (Patient not taking: Reported on 01/12/2020) 10 capsule 0  . insulin aspart (novoLOG) injection 10 Units     . insulin aspart (novoLOG) injection 8 Units      No facility-administered medications prior to visit.    Review of Systems Constitutional:   No  weight loss, night sweats,  Fevers, chills, fatigue, lassitude. HEENT:   No headaches,  Difficulty swallowing,  Tooth/dental problems,  Sore throat,  No sneezing, itching, ear ache, nasal congestion, post nasal drip,   CV:  No chest pain,  Orthopnea, PND, swelling in lower extremities, anasarca, dizziness, palpitations  GI  No heartburn, indigestion, abdominal pain, nausea, vomiting, diarrhea, change in bowel habits, loss of appetite  Resp: No shortness of breath with exertion or at rest.  No excess mucus, no productive cough,  No non-productive cough,  No coughing up of blood.  No change in color of mucus.  No wheezing.  No chest wall deformity  Skin: no rash or lesions.  GU: no dysuria, change in color of urine, no urgency or frequency.  No flank pain.  MS:  No joint pain or swelling.  No decreased range of motion.  No back pain.  Psych:  No change in mood or affect. No depression or anxiety.  No memory loss.     Objective:   Physical ExamBP (!) 155/101 (BP Location: Left Arm, Patient Position: Sitting, Cuff Size: Normal)   Pulse 89   Temp (!) 97.5 F (36.4 C) (Oral)   Resp 18   Ht '5\' 9"'  (1.753 m)   Wt 241 lb (109.3 kg)   SpO2 98%   BMI 35.59 kg/m   Gen: Pleasant, well-nourished, in no distress,  normal affect  ENT: No lesions,  mouth clear,  oropharynx clear, no postnasal drip  Neck: No JVD, no TMG, no carotid bruits  Lungs: No use of accessory muscles, no dullness to percussion, clear without rales or rhonchi  Cardiovascular: RRR, heart sounds normal, no murmur or gallops, no peripheral edema  Abdomen: soft and NT, no HSM,  BS normal  Musculoskeletal: dressing over LLE Calf.  L foot  Less  swollen.  L thigh normal   Neuro: alert, non focal  Skin: Warm, no lesions or rashes   CBC Latest Ref Rng & Units 06/24/2019 06/12/2019 06/09/2019  WBC 3.4 - 10.8 x10E3/uL 9.4 10.7(H) 11.2(H)  Hemoglobin 13.0 - 17.7 g/dL 16.0 14.6 15.5  Hematocrit 37.5 - 51.0 % 47.9 43.9 46.9  Platelets 150 - 450 x10E3/uL 286 342 288   BMP Latest Ref Rng & Units 11/17/2019 06/24/2019 06/14/2019  Glucose 65 - 99 mg/dL 315(H) 140(H) -  BUN 6 - 20 mg/dL 12 17 -  Creatinine 0.76 - 1.27 mg/dL 1.06 1.06 0.83  BUN/Creat Ratio 9 - '20 11 16 ' -  Sodium 134 - 144 mmol/L 135 142 -  Potassium 3.5 - 5.2 mmol/L 4.4 4.3 -  Chloride 96 - 106 mmol/L 98 102 -  CO2 20 - 29 mmol/L 26 21 -  Calcium 8.7 - 10.2 mg/dL 9.4 9.7 -  Lipid Panel     Component Value Date/Time   CHOL 169 06/24/2019 1052   TRIG 223 (H) 06/24/2019 1052   HDL 35 (L) 06/24/2019 1052   CHOLHDL 4.8 06/24/2019 1052   LDLCALC 89 06/24/2019 1052   Lab Results  Component Value Date   HGBA1C 11.6 (A) 11/17/2019   cbg 200      Assessment & Plan:  I personally reviewed all images and lab data in the [facility] system as well as any outside material available during this office visit and agree with the  radiology impressions.   Type 2 diabetes mellitus with hyperglycemia, with long-term current use of insulin ([place]) Plan will be to increase NovoLog to 11 units 3 times daily and provide the patient a 90-day supply per his insurance guidance  We will continue Ozempic Metformin and Lantus is currently prescribed  We gave the  patient dietary education and he will be returned in follow-up in 2 months  Hyperlipidemia associated with type 2 diabetes mellitus ([place]) Patient to continue atorvastatin daily for his cholesterol and diabetes  Essential hypertension We will continue to monitor blood pressure for now have asked the patient to obtain a blood pressure monitor cuff at home  Patient will continue amlodipine 10 mg daily and hydrochlorthiazide 12.5 mg  daily   Korin was seen today for follow-up.  Diagnoses and all orders for this visit:  Type 2 diabetes mellitus with hyperglycemia, with long-term current use of insulin ([facility]) -     Glucose (CBG) -     insulin aspart (NOVOLOG FLEXPEN) 100 UNIT/ML FlexPen; Inject 11 Units into the skin 3 (three) times daily with meals.  Essential hypertension  Hyperlipidemia associated with type 2 diabetes mellitus ([place])  The patient knows to obtain an eye exam in the next few months

## 2020-01-12 NOTE — Patient Instructions (Addendum)
Increase Novolog to 11 units three times a day with meals  No other medication changes  Return Dr Delford Field in 2 months

## 2020-01-12 NOTE — Assessment & Plan Note (Signed)
Plan will be to increase NovoLog to 11 units 3 times daily and provide the patient a 90-day supply per his insurance guidance  We will continue Ozempic Metformin and Lantus is currently prescribed  We gave the patient dietary education and he will be returned in follow-up in 2 months

## 2020-01-12 NOTE — Assessment & Plan Note (Signed)
We will continue to monitor blood pressure for now have asked the patient to obtain a blood pressure monitor cuff at home  Patient will continue amlodipine 10 mg daily and hydrochlorthiazide 12.5 mg daily

## 2020-01-19 ENCOUNTER — Emergency Department (HOSPITAL_COMMUNITY)
Admission: EM | Admit: 2020-01-19 | Discharge: 2020-01-19 | Disposition: A | Discharge status: Home / Self Care | Payer: BC Managed Care – PPO | Attending: Emergency Medicine | Admitting: Emergency Medicine

## 2020-01-19 ENCOUNTER — Other Ambulatory Visit: Payer: Self-pay

## 2020-01-19 ENCOUNTER — Encounter (HOSPITAL_COMMUNITY): Payer: Self-pay | Admitting: Emergency Medicine

## 2020-01-19 ENCOUNTER — Ambulatory Visit: Payer: Self-pay | Admitting: Critical Care Medicine

## 2020-01-19 DIAGNOSIS — E1165 Type 2 diabetes mellitus with hyperglycemia: Secondary | ICD-10-CM | POA: Diagnosis not present

## 2020-01-19 DIAGNOSIS — H538 Other visual disturbances: Secondary | ICD-10-CM | POA: Diagnosis not present

## 2020-01-19 DIAGNOSIS — Z79899 Other long term (current) drug therapy: Secondary | ICD-10-CM | POA: Insufficient documentation

## 2020-01-19 DIAGNOSIS — I10 Essential (primary) hypertension: Secondary | ICD-10-CM | POA: Insufficient documentation

## 2020-01-19 DIAGNOSIS — Z794 Long term (current) use of insulin: Secondary | ICD-10-CM | POA: Diagnosis not present

## 2020-01-19 DIAGNOSIS — R739 Hyperglycemia, unspecified: Secondary | ICD-10-CM

## 2020-01-19 LAB — BASIC METABOLIC PANEL
Anion gap: 12 (ref 5–15)
BUN: 15 mg/dL (ref 6–20)
CO2: 23 mmol/L (ref 22–32)
Calcium: 9.1 mg/dL (ref 8.9–10.3)
Chloride: 100 mmol/L (ref 98–111)
Creatinine, Ser: 1.18 mg/dL (ref 0.61–1.24)
GFR calc Af Amer: >60 mL/min (ref >60)
GFR calc non Af Amer: >60 mL/min (ref >60)
Glucose, Bld: 384 mg/dL — ABNORMAL HIGH (ref 70–99)
Potassium: 4.2 mmol/L (ref 3.5–5.1)
Sodium: 135 mmol/L (ref 135–145)

## 2020-01-19 LAB — CBC WITH DIFFERENTIAL/PLATELET
Abs Immature Granulocytes: 0.03 10*3/uL (ref 0.00–0.07)
Basophils Absolute: 0.1 10*3/uL (ref 0.0–0.1)
Basophils Relative: 1 %
Eosinophils Absolute: 0.1 10*3/uL (ref 0.0–0.5)
Eosinophils Relative: 1 %
HCT: 50.9 % (ref 39.0–52.0)
Hemoglobin: 17.6 g/dL — ABNORMAL HIGH (ref 13.0–17.0)
Immature Granulocytes: 0 %
Lymphocytes Relative: 39 %
Lymphs Abs: 3.3 10*3/uL (ref 0.7–4.0)
MCH: 29 pg (ref 26.0–34.0)
MCHC: 34.6 g/dL (ref 30.0–36.0)
MCV: 83.9 fL (ref 80.0–100.0)
Monocytes Absolute: 0.4 10*3/uL (ref 0.1–1.0)
Monocytes Relative: 5 %
Neutro Abs: 4.5 10*3/uL (ref 1.7–7.7)
Neutrophils Relative %: 54 %
Platelets: 191 10*3/uL (ref 150–400)
RBC: 6.07 MIL/uL — ABNORMAL HIGH (ref 4.22–5.81)
RDW: 12.2 % (ref 11.5–15.5)
WBC: 8.4 10*3/uL (ref 4.0–10.5)
nRBC: 0 % (ref 0.0–0.2)

## 2020-01-19 LAB — URINALYSIS, ROUTINE W REFLEX MICROSCOPIC
Bacteria, UA: NONE SEEN
Bilirubin Urine: NEGATIVE
Glucose, UA: >=500 mg/dL — AB
Ketones, ur: NEGATIVE mg/dL
Leukocytes,Ua: NEGATIVE
Nitrite: NEGATIVE
Protein, ur: NEGATIVE mg/dL
Specific Gravity, Urine: 1.024 (ref 1.005–1.030)
pH: 6 (ref 5.0–8.0)

## 2020-01-19 LAB — CBG MONITORING, ED
Glucose-Capillary: 331 mg/dL — ABNORMAL HIGH (ref 70–99)
Glucose-Capillary: 319 mg/dL — ABNORMAL HIGH (ref 70–99)
Glucose-Capillary: 261 mg/dL — ABNORMAL HIGH (ref 70–99)

## 2020-01-19 MED ORDER — SODIUM CHLORIDE 0.9 % IV BOLUS
2000.0000 mL | Freq: Once | INTRAVENOUS | Status: AC
  Administered 2020-01-19: 10:00:00 2000 mL via INTRAVENOUS

## 2020-01-19 MED ORDER — INSULIN ASPART 100 UNIT/ML IV SOLN
10.0000 [IU] | Freq: Once | INTRAVENOUS | Status: AC
  Administered 2020-01-19: 10 [IU] via INTRAVENOUS
  Filled 2020-01-19: qty 0.1

## 2020-01-19 NOTE — ED Triage Notes (Signed)
Patient C/C hyperglycemia, checked his fasting CBGthis AM and it was 502, and on his way to work he started experiencing blurred vision. Denies N/V/D, chest pain, SOB.

## 2020-01-19 NOTE — Discharge Instructions (Signed)
Follow up with your family doc.  Call them today and let them know you came to the ED.   Please return for worsening symptoms, one sided weakness or numbness difficulty with speech or swallowing or if you develop a fever chest pain or trouble breathing.

## 2020-01-19 NOTE — ED Provider Notes (Signed)
[facility]-EMERGENCY DEPT Provider Note   CSN: [id number] Arrival date & time: 01/19/20  0938     History Chief Complaint  Patient presents with  . Hyperglycemia  . Blurred Vision    Jovonta Szalkowski is a 33 y.o. male.  33 yo M with a chief complaints of hyperglycemia.  This is been an ongoing issue for him.  Just saw his family doctor a week ago and had an increase of his insulin dose.  Felt like his blood sugar had improved somewhat since that visit but woke up this morning and checked it and it was 500.  The number itself concerned him and he afterwards felt he was having some blurry vision and so came to the ED for evaluation.  Feels that that is resolved.  Denies one-sided numbness or weakness denies difficulty speech or swallowing.  Has been compliant with his medications.  Denies chest pain denies shortness of breath denies cough denies fever had one episode of vomiting yesterday.  Denies abdominal pain.  Denies urinary symptoms.  The history is provided by the patient.  Illness Severity:  Moderate Onset quality:  Gradual Duration:  1 week Timing:  Constant Progression:  Worsening Chronicity:  New Associated symptoms: vomiting   Associated symptoms: no abdominal pain, no chest pain, no congestion, no diarrhea, no fever, no headaches, no myalgias, no nausea, no rash and no shortness of breath        Past Medical History:  Diagnosis Date  . Diabetes mellitus without complication ([facility])    type 2   . Hypertension   . Hypertensive urgency 06/07/2019  . Phimosis     Patient Active Problem List   Diagnosis Date Noted  . Toenail fungus 07/15/2019  . Bilateral bunions 07/15/2019  . Superficial thrombosis of left lower extremity 06/24/2019  . Type 2 diabetes mellitus with hyperglycemia, with long-term current use of insulin ([facility]) 06/07/2019  . Essential hypertension 06/07/2019  . Hypertriglyceridemia 11/20/2017  . Hyperlipidemia associated with  type 2 diabetes mellitus ([facility]) 06/12/2017    Past Surgical History:  Procedure Laterality Date  . CIRCUMCISION    . I & D EXTREMITY Left 06/10/2019   Procedure: IRRIGATION AND DEBRIDEMENT LEFT LOWER EXTREMITY;  Surgeon: Xu, Naiping M, MD;  Location: [facility] OR;  Service: Orthopedics;  Laterality: Left;  . TONSILLECTOMY         Family History  Problem Relation Age of Onset  . Diabetes Father   . Diabetes Paternal Uncle     Social History   Tobacco Use  . Smoking status: Never Smoker  . Smokeless tobacco: Never Used  Substance Use Topics  . Alcohol use: No  . Drug use: Not Currently    Types: Marijuana    Home Medications Prior to Admission medications   Medication Sig Start Date End Date Taking? Authorizing Provider  amLODipine (NORVASC) 10 MG tablet Take 1 tablet (10 mg total) by mouth daily. 11/17/19   Wright, Patrick E, MD  atorvastatin (LIPITOR) 40 MG tablet Take 1 tablet (40 mg total) by mouth daily. 11/17/19   Wright, Patrick E, MD  blood glucose meter kit and supplies KIT Dispense based on patient and insurance preference. Use up to four times daily as directed. (FOR ICD-9 250.00, 250.01). 06/15/19   Memon, Jehanzeb, MD  docusate sodium (COLACE) 100 MG capsule Take 1 capsule (100 mg total) by mouth 2 (two) times daily. Patient not taking: Reported on 01/12/2020 06/15/19   Memon, Jehanzeb, MD  hydrochlorothiazide (MICROZIDE) 12.5   MG capsule Take 1 capsule (12.5 mg total) by mouth daily. 11/17/19   Elsie Stain, MD  ibuprofen (ADVIL) 600 MG tablet Take 1 tablet (600 mg total) by mouth every 8 (eight) hours as needed. 07/15/19   Elsie Stain, MD  insulin aspart (NOVOLOG FLEXPEN) 100 UNIT/ML FlexPen Inject 11 Units into the skin 3 (three) times daily with meals. 01/12/20   Elsie Stain, MD  Insulin Glargine (LANTUS SOLOSTAR) 100 UNIT/ML Solostar Pen Inject 55 Units into the skin daily. 01/01/20   Elsie Stain, MD  losartan (COZAAR) 100 MG tablet Take 1 tablet (100  mg total) by mouth daily. 11/17/19   Elsie Stain, MD  metFORMIN (GLUCOPHAGE) 500 MG tablet Take 2 tablets (1,000 mg total) by mouth 2 (two) times daily. 11/17/19   Elsie Stain, MD  polyethylene glycol (MIRALAX / GLYCOLAX) 17 g packet Take 17 g by mouth daily as needed for mild constipation. 06/15/19   Kathie Dike, MD  Semaglutide,0.25 or 0.5MG/DOS, (OZEMPIC, 0.25 OR 0.5 MG/DOSE,) 2 MG/1.5ML SOPN Inject 0.5 mg into the skin once a week. 01/01/20   Elsie Stain, MD  TRUEPLUS 5-BEVEL PEN NEEDLES 31G X 5 MM MISC USE AS DIRECTED FOR LANTUS 11/23/19   Elsie Stain, MD    Allergies    Patient has no known allergies.  Review of Systems   Review of Systems  Constitutional: Negative for chills and fever.  HENT: Negative for congestion and facial swelling.   Eyes: Positive for visual disturbance. Negative for discharge.  Respiratory: Negative for shortness of breath.   Cardiovascular: Negative for chest pain and palpitations.  Gastrointestinal: Positive for vomiting. Negative for abdominal pain, diarrhea and nausea.  Musculoskeletal: Negative for arthralgias and myalgias.  Skin: Negative for color change and rash.  Neurological: Negative for tremors, syncope and headaches.  Psychiatric/Behavioral: Negative for confusion and dysphoric mood.    Physical Exam Updated Vital Signs BP (!) 158/106   Pulse 83   Temp 98 F (36.7 C) (Oral)   Resp 20   Ht 5' 9" (1.753 m)   Wt 109.3 kg   SpO2 96%   BMI 35.59 kg/m   Physical Exam Vitals and nursing note reviewed.  Constitutional:      Appearance: He is well-developed.  HENT:     Head: Normocephalic and atraumatic.  Eyes:     Pupils: Pupils are equal, round, and reactive to light.  Neck:     Vascular: No JVD.  Cardiovascular:     Rate and Rhythm: Normal rate and regular rhythm.     Heart sounds: No murmur. No friction rub. No gallop.   Pulmonary:     Effort: No respiratory distress.     Breath sounds: No wheezing.    Abdominal:     General: There is no distension.     Tenderness: There is no abdominal tenderness. There is no guarding or rebound.  Musculoskeletal:        General: Normal range of motion.     Cervical back: Normal range of motion and neck supple.  Skin:    Coloration: Skin is not pale.     Findings: No rash.  Neurological:     Mental Status: He is alert and oriented to person, place, and time.  Psychiatric:        Behavior: Behavior normal.     ED Results / Procedures / Treatments   Labs (all labs ordered are listed, but only abnormal results are displayed) Labs  Reviewed  CBC WITH DIFFERENTIAL/PLATELET - Abnormal; Notable for the following components:      Result Value   RBC 6.07 (*)    Hemoglobin 17.6 (*)    All other components within normal limits  BASIC METABOLIC PANEL - Abnormal; Notable for the following components:   Glucose, Bld 384 (*)    All other components within normal limits  URINALYSIS, ROUTINE W REFLEX MICROSCOPIC - Abnormal; Notable for the following components:   Glucose, UA >=500 (*)    Hgb urine dipstick SMALL (*)    All other components within normal limits  CBG MONITORING, ED - Abnormal; Notable for the following components:   Glucose-Capillary 331 (*)    All other components within normal limits  CBG MONITORING, ED - Abnormal; Notable for the following components:   Glucose-Capillary 319 (*)    All other components within normal limits    EKG None  Radiology No results found.  Procedures Procedures (including critical care time)  Medications Ordered in ED Medications  sodium chloride 0.9 % bolus 2,000 mL (2,000 mLs Intravenous New Bag/Given 01/19/20 1001)  insulin aspart (novoLOG) injection 10 Units (10 Units Intravenous Given 01/19/20 1148)    ED Course  I have reviewed the triage vital signs and the nursing notes.  Pertinent labs & imaging results that were available during my care of the patient were reviewed by me and considered in  my medical decision making (see chart for details).    MDM Rules/Calculators/A&P                      33 yo M with a chief complaint hyperglycemia.  Patient transient episode of blurry vision after seeing his blood sugar was so high.  1 episode of vomiting.  Will give 2 L of IV fluids at evaluate for DKA and reassess.  Lab work is returned patient is not in diabetic ketoacidosis.  Does not anion gap is normal.  Blood sugar in the low 300s.  Given 2 L of IV fluids.  Bolus of insulin.  Patient's visual complaint continues to be resolved.  We will have him follow-up with his family doctor.  11:56 AM:  I have discussed the diagnosis/risks/treatment options with the patient and believe the pt to be eligible for discharge home to follow-up with PCP. We also discussed returning to the ED immediately if new or worsening sx occur. We discussed the sx which are most concerning (e.g., sudden worsening pain, fever, inability to tolerate by mouth, stroke s/sx) that necessitate immediate return. Medications administered to the patient during their visit and any new prescriptions provided to the patient are listed below.  Medications given during this visit Medications  sodium chloride 0.9 % bolus 2,000 mL (2,000 mLs Intravenous New Bag/Given 01/19/20 1001)  insulin aspart (novoLOG) injection 10 Units (10 Units Intravenous Given 01/19/20 1148)     The patient appears reasonably screen and/or stabilized for discharge and I doubt any other medical condition or other EMC requiring further screening, evaluation, or treatment in the ED at this time prior to discharge.   Final Clinical Impression(s) / ED Diagnoses Final diagnoses:  Hyperglycemia  Blurry vision    Rx / DC Orders ED Discharge Orders    None       Floyd, Dan, DO 01/19/20 1156  

## 2020-02-02 ENCOUNTER — Ambulatory Visit: Payer: BC Managed Care – PPO | Admitting: Critical Care Medicine

## 2020-02-02 NOTE — Progress Notes (Deleted)
Subjective:    Patient ID: Anthony Hubbard, male    DOB: [birth year], 34 y.o.   MRN: [id number]  34 y.o.M with type 2 diabetes and hypertension and recent admission for left lower extremity cellulitis and abscess with need for drainage per orthopedics  This is a follow-up visit for hypertension type 2 diabetes and recent admission for left lower extremity cellulitis and abscess with drainage.  The patient's lower extremity is improving and wound care has been ongoing per orthopedics.  Recent notes from orthopedics suggest the wound is slowly healing.  The patient still on Keflex for antibiotic coverage of gram-positive cocci from the cultures.  The patient maintains the Lantus and also now on Trulicity.  Blood sugars have been ranging in the lower area of 1 20-1 40 at home.  The patient is due tetanus vaccine and flu shot.  Patient also needs a foot exam as well as microalbumin check in the urine.  Note the patient has still some pain particular with dressing changes on the wound.  11/17/2019 This is a follow-up visit from a September 2020 visit.  The patient is being treated for type 2 diabetes and hyperlipidemia.  Patient also had cellulitis and abscess of the left leg.  The cellulitis and abscess have resolved and the wound is healed.  The patient is now off antibiotics.  The patient is being followed for diabetes and hypertension.  Noted home he states his blood pressures in the 140/80 range.  His blood sugars have been in the 1 20-1 40 range fasting and then get up to mid 200s postprandial  He has not been compliant with his diet.  Note today's hemoglobin A1c is 11 and his CBG is 260  The patient tends to eat a lot in different times and does not take snacks that are healthy in for him between meals but instead eats unhealthy snacks  01/12/2020 The patient returns today for follow-up and continues to run high sugars although not as high as previous visits.  His fastings were in the 200  range postprandial 300.  Previously he was fasting in the 450 range.  He is not been able to access his NovoLog due to insurance barriers.  He is on the Lantus at 55 units daily and is on Metformin at 1000 mg twice daily as well now is on Ozempic 0.5 weekly  Blood pressure showing some improved control  We have been giving the patient counseling with regards to his diet   There are no other new complaints   02/02/2020  Type 2 diabetes mellitus with hyperglycemia, with long-term current use of insulin (Anthony Hubbard) Plan will be to increase NovoLog to 11 units 3 times daily and provide the patient a 90-day supply per his insurance guidance  We will continue Ozempic Metformin and Lantus is currently prescribed  We gave the patient dietary education and he will be returned in follow-up in 2 months  Hyperlipidemia associated with type 2 diabetes mellitus (Anthony Hubbard) Patient to continue atorvastatin daily for his cholesterol and diabetes  Essential hypertension We will continue to monitor blood pressure for now have asked the patient to obtain a blood pressure monitor cuff at home  Patient will continue amlodipine 10 mg daily and hydrochlorthiazide 12.5 mg daily   Anthony Hubbard was seen today for follow-up.  Diagnoses and all orders for this visit:  Type 2 diabetes mellitus with hyperglycemia, with long-term current use of insulin ([facility]) -     Glucose (CBG) -  insulin aspart (NOVOLOG FLEXPEN) 100 UNIT/ML FlexPen; Inject 11 Units into the skin 3 (three) times daily with meals.  Essential hypertension  Hyperlipidemia associated with type 2 diabetes mellitus (Anthony Hubbard)  The patient knows to obtain an eye exam in the next few months   Past Medical History:  Diagnosis Date  . Diabetes mellitus without complication ([place])    type 2   . Hypertension   . Hypertensive urgency 06/07/2019  . Phimosis      Family History  Problem Relation Age of Onset  . Diabetes Father   . Diabetes Paternal Uncle       Social History   Socioeconomic History  . Marital status: Married    Spouse name: Not on file  . Number of children: Not on file  . Years of education: Not on file  . Highest education level: Not on file  Occupational History  . Not on file  Tobacco Use  . Smoking status: Never Smoker  . Smokeless tobacco: Never Used  Substance and Sexual Activity  . Alcohol use: No  . Drug use: Not Currently    Types: Marijuana  . Sexual activity: Not on file  Other Topics Concern  . Not on file  Social History Narrative  . Not on file   Social Determinants of Health   Financial Resource Strain:   . Difficulty of Paying Living Expenses: Not on file  Food Insecurity:   . Worried About Charity fundraiser in the Last Year: Not on file  . Ran Out of Food in the Last Year: Not on file  Transportation Needs:   . Lack of Transportation (Medical): Not on file  . Lack of Transportation (Non-Medical): Not on file  Physical Activity:   . Days of Exercise per Week: Not on file  . Minutes of Exercise per Session: Not on file  Stress:   . Feeling of Stress : Not on file  Social Connections:   . Frequency of Communication with Friends and Family: Not on file  . Frequency of Social Gatherings with Friends and Family: Not on file  . Attends Religious Services: Not on file  . Active Member of Clubs or Organizations: Not on file  . Attends Archivist Meetings: Not on file  . Marital Status: Not on file  Intimate Partner Violence:   . Fear of Current or Ex-Partner: Not on file  . Emotionally Abused: Not on file  . Physically Abused: Not on file  . Sexually Abused: Not on file     No Known Allergies   Outpatient Medications Prior to Visit  Medication Sig Dispense Refill  . amLODipine (NORVASC) 10 MG tablet Take 1 tablet (10 mg total) by mouth daily. 90 tablet 3  . atorvastatin (LIPITOR) 40 MG tablet Take 1 tablet (40 mg total) by mouth daily. 30 tablet 3  . blood glucose meter kit  and supplies KIT Dispense based on patient and insurance preference. Use up to four times daily as directed. (FOR ICD-9 250.00, 250.01). 1 each 0  . docusate sodium (COLACE) 100 MG capsule Take 1 capsule (100 mg total) by mouth 2 (two) times daily. (Patient not taking: Reported on 01/12/2020) 10 capsule 0  . hydrochlorothiazide (MICROZIDE) 12.5 MG capsule Take 1 capsule (12.5 mg total) by mouth daily. 30 capsule 0  . ibuprofen (ADVIL) 600 MG tablet Take 1 tablet (600 mg total) by mouth every 8 (eight) hours as needed. 60 tablet 1  . insulin aspart (NOVOLOG FLEXPEN) 100  UNIT/ML FlexPen Inject 11 Units into the skin 3 (three) times daily with meals. 30 mL 1  . Insulin Glargine (LANTUS SOLOSTAR) 100 UNIT/ML Solostar Pen Inject 55 Units into the skin daily. 5 pen 6  . losartan (COZAAR) 100 MG tablet Take 1 tablet (100 mg total) by mouth daily. 90 tablet 1  . metFORMIN (GLUCOPHAGE) 500 MG tablet Take 2 tablets (1,000 mg total) by mouth 2 (two) times daily. 120 tablet 2  . polyethylene glycol (MIRALAX / GLYCOLAX) 17 g packet Take 17 g by mouth daily as needed for mild constipation. 14 each 0  . Semaglutide,0.25 or 0.5MG/DOS, (OZEMPIC, 0.25 OR 0.5 MG/DOSE,) 2 MG/1.5ML SOPN Inject 0.5 mg into the skin once a week. 3 pen 1  . TRUEPLUS 5-BEVEL PEN NEEDLES 31G X 5 MM MISC USE AS DIRECTED FOR LANTUS 100 each 0   No facility-administered medications prior to visit.    Review of Systems Constitutional:   No  weight loss, night sweats,  Fevers, chills, fatigue, lassitude. HEENT:   No headaches,  Difficulty swallowing,  Tooth/dental problems,  Sore throat,                No sneezing, itching, ear ache, nasal congestion, post nasal drip,   CV:  No chest pain,  Orthopnea, PND, swelling in lower extremities, anasarca, dizziness, palpitations  GI  No heartburn, indigestion, abdominal pain, nausea, vomiting, diarrhea, change in bowel habits, loss of appetite  Resp: No shortness of breath with exertion or at rest.   No excess mucus, no productive cough,  No non-productive cough,  No coughing up of blood.  No change in color of mucus.  No wheezing.  No chest wall deformity  Skin: no rash or lesions.  GU: no dysuria, change in color of urine, no urgency or frequency.  No flank pain.  MS:  No joint pain or swelling.  No decreased range of motion.  No back pain.  Psych:  No change in mood or affect. No depression or anxiety.  No memory loss.     Objective:   Physical ExamThere were no vitals taken for this visit.  Gen: Pleasant, well-nourished, in no distress,  normal affect  ENT: No lesions,  mouth clear,  oropharynx clear, no postnasal drip  Neck: No JVD, no TMG, no carotid bruits  Lungs: No use of accessory muscles, no dullness to percussion, clear without rales or rhonchi  Cardiovascular: RRR, heart sounds normal, no murmur or gallops, no peripheral edema  Abdomen: soft and NT, no HSM,  BS normal  Musculoskeletal: dressing over LLE Calf.  L foot  Less swollen.  L thigh normal   Neuro: alert, non focal  Skin: Warm, no lesions or rashes   CBC Latest Ref Rng & Units 01/19/2020 06/24/2019 06/12/2019  WBC 4.0 - 10.5 K/uL 8.4 9.4 10.7(H)  Hemoglobin 13.0 - 17.0 g/dL 17.6(H) 16.0 14.6  Hematocrit 39.0 - 52.0 % 50.9 47.9 43.9  Platelets 150 - 400 K/uL 191 286 342   BMP Latest Ref Rng & Units 01/19/2020 11/17/2019 06/24/2019  Glucose 70 - 99 mg/dL 384(H) 315(H) 140(H)  BUN 6 - 20 mg/dL '15 12 17  ' Creatinine 0.61 - 1.24 mg/dL 1.18 1.06 1.06  BUN/Creat Ratio 9 - 20 - 11 16  Sodium 135 - 145 mmol/L 135 135 142  Potassium 3.5 - 5.1 mmol/L 4.2 4.4 4.3  Chloride 98 - 111 mmol/L 100 98 102  CO2 22 - 32 mmol/L '23 26 21  ' Calcium 8.9 -  10.3 mg/dL 9.1 9.4 9.7  Lipid Panel     Component Value Date/Time   CHOL 169 06/24/2019 1052   TRIG 223 (H) 06/24/2019 1052   HDL 35 (L) 06/24/2019 1052   CHOLHDL 4.8 06/24/2019 1052   LDLCALC 89 06/24/2019 1052   Lab Results  Component Value Date   HGBA1C 11.6  (A) 11/17/2019   cbg 200      Assessment & Plan:  I personally reviewed all images and lab data in the [facility] system as well as any outside material available during this office visit and agree with the  radiology impressions.   No problem-specific Assessment & Plan notes found for this encounter.   There are no diagnoses linked to this encounter.The patient knows to obtain an eye exam in the next few months

## 2020-03-03 NOTE — Telephone Encounter (Signed)
-----   Message from Storm Frisk, MD sent at 03/03/2020  2:23 PM EDT ----- This pt needs to be seen soon ,any provider or with [facility]

## 2020-03-03 NOTE — Telephone Encounter (Signed)
Pt called pt no answer no vm. If pt calls back please make an appointment with [name]

## 2020-03-07 ENCOUNTER — Other Ambulatory Visit: Payer: Self-pay

## 2020-03-07 ENCOUNTER — Other Ambulatory Visit: Payer: Self-pay | Admitting: Critical Care Medicine

## 2020-03-07 ENCOUNTER — Ambulatory Visit: Payer: BC Managed Care – PPO | Attending: Critical Care Medicine | Admitting: Pharmacist

## 2020-03-07 DIAGNOSIS — Z794 Long term (current) use of insulin: Secondary | ICD-10-CM | POA: Diagnosis not present

## 2020-03-07 DIAGNOSIS — E1165 Type 2 diabetes mellitus with hyperglycemia: Secondary | ICD-10-CM

## 2020-03-07 LAB — POCT URINALYSIS DIP (CLINITEK)
Bilirubin, UA: NEGATIVE
Glucose, UA: 500 mg/dL — AB
Ketones, POC UA: NEGATIVE mg/dL
Leukocytes, UA: NEGATIVE
Nitrite, UA: NEGATIVE
Spec Grav, UA: 1.025 (ref 1.010–1.025)
Urobilinogen, UA: 0.2 E.U./dL
pH, UA: 5.5 (ref 5.0–8.0)

## 2020-03-07 LAB — GLUCOSE, POCT (MANUAL RESULT ENTRY): POC Glucose: 409 mg/dl — AB (ref 70–99)

## 2020-03-07 MED ORDER — LANTUS SOLOSTAR 100 UNIT/ML ~~LOC~~ SOPN: 15.0000 [IU] | PEN_INJECTOR | Freq: Every day | SUBCUTANEOUS | 2 refills | Status: DC

## 2020-03-07 MED ORDER — INSULIN ASPART 100 UNIT/ML ~~LOC~~ SOLN
10.0000 [IU] | Freq: Once | SUBCUTANEOUS | Status: AC
  Administered 2020-03-07: 10 [IU] via SUBCUTANEOUS

## 2020-03-07 MED ORDER — NOVOLOG FLEXPEN 100 UNIT/ML ~~LOC~~ SOPN: 11.0000 [IU] | PEN_INJECTOR | Freq: Three times a day (TID) | SUBCUTANEOUS | 1 refills | Status: DC

## 2020-03-07 NOTE — Progress Notes (Signed)
   S:    PCP: Dr. Delford Field   No chief complaint on file.  Patient arrives good spirits.  Presents for diabetes evaluation, education, and management Patient was referred and last seen by Primary Care Provider on 01/12/20. He reports that he recently got a new job with new insurance. We have been attempting to reach him to schedule an appointment but he recently changed his number when he switched jobs. This new number has been added to our system.   Patient reports diabetes was diagnosed several years ago.  Family/Social History: DM (father); never smoker, denies alcohol intake.  Insurance coverage/medication affordability: self-pay   Patient denies adherence with medications.  Current diabetes medications include: Novolog 11 units TID (not taking), Lantus 35 units daily (not taking), metformin 1000 mg BID, Ozempic 0.5 mg weekly  Current hypertension medications include: amlodipine 10 mg daily, HCTZ 12.5 mg daily, losartan 100 mg daily  Current hyperlipidemia medications include: atorvastatin 40 mg daily   Patient denies hypoglycemic events.  Patient reported dietary habits: eats fish and vegetables, everything cooked in the oven, no oils. Drinks water and green tea w/o sugar. Patient-reported exercise habits: walks around apartment complex for ~1hr every day.   Patient denies nocturia.  Patient denies neuropathy. Patient denies visual changes. Patient reports self foot exams.     O:  POCT glucose: 409 - 10 u Novolog given; 347 on second check 15 mins later. UA DIP ([place]) performed: negative for ketones, glucose =500, trace blood.  Last A1c: 11.6 (11/17/19)  Lipid Panel: last LDL 89 (06/24/19)  Patient reports drastic improvement in home glycemic control.  Home fasting CBG: per patient, 120-130s Home post-prandial/random CBGs: 180s-200s  Clinical ASCVD: No  The ASCVD Risk score Denman George DC Jr., et al., 2013) failed to calculate for the following reasons:   The 2013 ASCVD  risk score is only valid for ages 18 to 23   A/P: Diabetes longstanding currently uncontrolled based on A1c but home CBGs are improving. Patient is able to verbalize appropriate hypoglycemia management plan.   Patient with hyperglycemia today. Consulted w/ provider in clinic. 10u of Novolog given and patient responded well. He was quite surprised concerning this given the degree of control at home recently. Admits that this may be due to some dietary indiscretion with breakfast this AM. With that being said, per patient, he is only taking metformin and Ozempic. I will order Novolog and Basaglar for him to keep on hand. I have advised for him to restart Basaglar but at a reduced dose given home glucose levels.    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  -Continue metformin, Ozempic. -Extensively discussed pathophysiology of DM, recommended lifestyle interventions, dietary effects on glycemic control -Counseled on s/sx of and management of hypoglycemia -A1c overdue; will wait until he sees Dr. Delford Field next month.   Written patient instructions provided.  Total time in face to face counseling 30 minutes.   Follow up with Dr. Delford Field in May. Me on Friday.  Butch Penny, PharmD, CPP Clinical Pharmacist [facility] [phone]

## 2020-03-08 ENCOUNTER — Encounter: Payer: Self-pay | Admitting: Pharmacist

## 2020-03-09 ENCOUNTER — Telehealth: Payer: Self-pay | Admitting: Critical Care Medicine

## 2020-03-09 ENCOUNTER — Ambulatory Visit: Payer: Self-pay | Admitting: Critical Care Medicine

## 2020-03-09 NOTE — Telephone Encounter (Signed)
Patient call returned. Verified I was speaking to him using two identifiers. He has not been able to pick up his insulin as he does not get paid until Friday. We do not have samples to give. Verified that patient's CBG was 468 this AM - I recommend for the patient to go to the ED for further evaluation, however, he states that he cannot do this d/t cost. Will transfer to scheduling to see if we can get a walk-in appointment. At this point, I think he needs to be seen by a provider.

## 2020-03-09 NOTE — Telephone Encounter (Signed)
Patient called and requested to inform his 486 fasting blood sugar, this morning around 7am. Patient stated that he was unable to go to work due to his vision. Please follow up at your earliest convenience.

## 2020-03-11 ENCOUNTER — Ambulatory Visit: Payer: BC Managed Care – PPO | Admitting: Pharmacist

## 2020-03-14 ENCOUNTER — Ambulatory Visit: Payer: BC Managed Care – PPO | Admitting: Pharmacist

## 2020-03-16 NOTE — Progress Notes (Signed)
Patient ID: Anthony Hubbard, male    DOB: 09/22/1986  MRN: [id number]  CC: Diabetes and high blood pressure follow-up  Subjective: Anthony Hubbard is a 34 y.o. male with history of essential hypertension, superficial thrombosis of left lower extremity, type 2 diabetes with hyperglycemia with long-term current use of insulin, hyperlipidemia associated with type 2 diabetes, and hypertriglyceridemia who presents for diabetes and hypertension follow-up.  1. DIABETES TYPE 2 FOLLOW-UP: Last A1C:  None recorded Results for orders placed or performed in visit on 03/07/20  POCT URINALYSIS DIP (CLINITEK)  Result Value Ref Range   Color, UA yellow yellow   Clarity, UA clear clear   Glucose, UA =500 (A) negative mg/dL   Bilirubin, UA negative negative   Ketones, POC UA negative negative mg/dL   Spec Grav, UA 1.025 1.010 - 1.025   Blood, UA trace-intact (A) negative   pH, UA 5.5 5.0 - 8.0   POC PROTEIN,UA trace negative, trace   Urobilinogen, UA 0.2 0.2 or 1.0 E.U./dL   Nitrite, UA Negative Negative   Leukocytes, UA Negative Negative  Glucose (CBG)  Result Value Ref Range   POC Glucose 409 (A) 70 - 99 mg/dl    Med Adherence:  _0  Yes    _1  No Medication side effects:  _2  Yes    _3  No Home Monitoring?  _4  Yes    _5  No Home glucose results range: over this past week had a wide range of blood sugars from 90-480, normal range for him is 200-300. Diet Adherence: _6  Yes    _7  No  Exercise: _8  Yes    _9  No Hypoglycemic episodes?: _10  Yes    _11  No Numbness of the feet? _12  Yes    _13  No Retinopathy hx? _14  Yes    _15  No Last eye exam: 2021 Comments: Patient reports he has not taken diabetes medication today. Reports when his blood sugars get really high or really low his vision appears as if he is staring into high-beam lights of a car.    Last appointment 01/12/2020 with Dr. Joya Gaskins. During that encounter Novolog increased to 11 units three times daily, Ozempic/Metformin/Lantus  continued and to return in 2 months for follow-up.  Last appointment 03/07/2020 with Dr. Daisy Blossom. During that encounter patient was restarted on Basaglar 15 units daily, continued on Metformin and Ozempic, discussed lifestyle interventions and dietary effects on glycemic control, signs and symptoms of and management of hypoglycemia, and A1C collected at next visit with primary physician.  2. HYPERTENSION FOLLOW-UP:  Currently taking: see medication list Med Adherence: _16  Yes    _17  No Medication side effects: _18  Yes    _19  No Adherence with salt restriction: _20  Yes    _21  No Exercise: Yes _22  No _23   Home Monitoring?: _24  Yes    _25  No, but patient reports he does have a home monitor and plans to purchase some batteries to get it working again Monitoring Frequency: _26  Yes    _27  No Home BP results range: _28  Yes    _29  No Smoking _30  Yes _31  No SOB? _32  Yes    _33  No Chest Pain?: _34  Yes    _35  No Leg swelling?: _36  Yes    _37  No, no leg swelling but having some tenderness of left lower extremity since having an irrigation and debridement at the same site in July 2020. Reports onset of toenail fungus shortly after this procedure.  Headaches?: _38  Yes    _39  No Dizziness? _40  Yes, random  times Comments: Last visit 01/12/2020 with Dr. Joya Gaskins during that encounter patient continued on Amlodipine 10 mg/daily, Hydrochlorothiazide 12.5 mg/daily, and Losartan 100 mg/daily .   3. HYPERLIPIDEMIA FOLLOW-UP:  Last Lipid Panel results:  HDL  Date Value Ref Range Status  06/24/2019 35 (L) >39 mg/dL Final   Triglycerides  Date Value Ref Range Status  06/24/2019 223 (H) 0 - 149 mg/dL Final    Med Adherence: _0  Yes    _1  No Medication side effects: _2  Yes    _3  No Muscle aches:  _4  Yes    _5  No Diet Adherence: _6  Yes    _7  No Comments: Last visit 01/12/2020 with Dr. Joya Gaskins. During that encounter Atorvastatin daily prescribed.  Patient Active Problem List   Diagnosis Date Noted  . Toenail fungus  07/15/2019  . Bilateral bunions 07/15/2019  . Superficial thrombosis of left lower extremity 06/24/2019  . Type 2 diabetes mellitus with hyperglycemia, with long-term current use of insulin ([place]) 06/07/2019  . Essential hypertension 06/07/2019  . Hypertriglyceridemia 11/20/2017  . Hyperlipidemia associated with type 2 diabetes mellitus ([place]) 06/12/2017     Current Outpatient Medications on File Prior to Visit  Medication Sig Dispense Refill  . amLODipine (NORVASC) 10 MG tablet Take 1 tablet (10 mg total) by mouth daily. 90 tablet 3  . atorvastatin (LIPITOR) 40 MG tablet Take 1 tablet (40 mg total) by mouth daily. 30 tablet 3  . blood glucose meter kit and supplies KIT Dispense based on patient and insurance preference. Use up to four times daily as directed. (FOR ICD-9 250.00, 250.01). 1 each 0  . docusate sodium (COLACE) 100 MG capsule Take 1 capsule (100 mg total) by mouth 2 (two) times daily. (Patient not taking: Reported on 01/12/2020) 10 capsule 0  . hydrochlorothiazide (MICROZIDE) 12.5 MG capsule Take 1 capsule (12.5 mg total) by mouth daily. 30 capsule 0  . ibuprofen (ADVIL) 600 MG tablet Take 1 tablet (600 mg total) by mouth every 8 (eight) hours as needed. 60 tablet 1  . insulin aspart (NOVOLOG FLEXPEN) 100 UNIT/ML FlexPen Inject 11 Units into the skin 3 (three) times daily with meals. 30 mL 1  . Insulin Glargine (BASAGLAR KWIKPEN) 100 UNIT/ML Inject 0.15 mLs (15 Units total) into the skin daily. 15 mL 2  . losartan (COZAAR) 100 MG tablet Take 1 tablet (100 mg total) by mouth daily. 90 tablet 1  . metFORMIN (GLUCOPHAGE) 500 MG tablet Take 2 tablets (1,000 mg total) by mouth 2 (two) times daily. 120 tablet 2  . polyethylene glycol (MIRALAX / GLYCOLAX) 17 g packet Take 17 g by mouth daily as needed for mild constipation. 14 each 0  . Semaglutide,0.25 or 0.5MG/DOS, (OZEMPIC, 0.25 OR 0.5 MG/DOSE,) 2 MG/1.5ML SOPN Inject 0.5 mg into the skin once a week. 3 pen 1  . TRUEPLUS 5-BEVEL PEN  NEEDLES 31G X 5 MM MISC USE AS DIRECTED FOR LANTUS 100 each 0   No current facility-administered medications on file prior to visit.    No Known Allergies  Social History   Socioeconomic History  . Marital status: Married    Spouse name: Not on file  . Number of children: Not on file  . Years of education: Not on file  . Highest education level: Not on file  Occupational History  . Not on file  Tobacco Use  . Smoking status: Never Smoker  . Smokeless tobacco: Never Used  Substance and Sexual Activity  . Alcohol use: No  . Drug use: Not  Currently    Types: Marijuana  . Sexual activity: Not on file  Other Topics Concern  . Not on file  Social History Narrative  . Not on file   Social Determinants of Health   Financial Resource Strain:   . Difficulty of Paying Living Expenses:   Food Insecurity:   . Worried About Charity fundraiser in the Last Year:   . Arboriculturist in the Last Year:   Transportation Needs:   . Film/video editor (Medical):   Marland Kitchen Lack of Transportation (Non-Medical):   Physical Activity:   . Days of Exercise per Week:   . Minutes of Exercise per Session:   Stress:   . Feeling of Stress :   Social Connections:   . Frequency of Communication with Friends and Family:   . Frequency of Social Gatherings with Friends and Family:   . Attends Religious Services:   . Active Member of Clubs or Organizations:   . Attends Archivist Meetings:   Marland Kitchen Marital Status:   Intimate Partner Violence:   . Fear of Current or Ex-Partner:   . Emotionally Abused:   Marland Kitchen Physically Abused:   . Sexually Abused:     Family History  Problem Relation Age of Onset  . Diabetes Father   . Diabetes Paternal Uncle     Past Surgical History:  Procedure Laterality Date  . CIRCUMCISION    . I & D EXTREMITY Left 06/10/2019   Procedure: IRRIGATION AND DEBRIDEMENT LEFT LOWER EXTREMITY;  Surgeon: Leandrew Koyanagi, MD;  Location: [place] [facility];  Service: Orthopedics;   Laterality: Left;  . TONSILLECTOMY      ROS: Review of Systems Negative except as stated above  PHYSICAL EXAM: Vitals with BMI 03/18/2020 03/18/2020 01/19/2020  Height - _0  -  Weight - 245 lbs 13 oz -  BMI - 87.56 -  Systolic 433 295 188  Diastolic 86 416 606  Pulse - 93 83  SpO2- 97%, room air Temperature- 98.1 F, oral  Physical Exam General appearance - alert, well appearing, and in no distress and oriented to person, place, and time Mental status - alert, oriented to person, place, and time, normal mood, behavior, speech, dress, motor activity, and thought processes Eyes - pupils equal and reactive, extraocular eye movements intact Ears - bilateral TM's and external ear canals normal Nose - normal and patent, no erythema, discharge or polyps and normal nontender sinuses Mouth - mucous membranes moist, pharynx normal without lesions Neck - supple, no significant adenopathy Lymphatics - no palpable lymphadenopathy, no hepatosplenomegaly Chest - clear to auscultation, no wheezes, rales or rhonchi, symmetric air entry, no tachypnea, retractions or cyanosis Heart - normal rate, regular rhythm, normal S1, S2, no murmurs, rubs, clicks or gallops Neurological - alert, oriented, normal speech, no focal findings or movement disorder noted, neck supple without rigidity, cranial nerves II through XII intact, funduscopic exam normal, discs flat and sharp, DTR's normal and symmetric, motor and sensory grossly normal bilaterally, normal muscle tone, no tremors, strength 5/5, Romberg sign negative, normal gait and station Musculoskeletal - no joint deformity or swelling, left calf muscular tenderness noted Extremities - peripheral pulses normal, no pedal edema, no clubbing or cyanosis Skin - normal coloration and turgor, no rashes, no suspicious skin lesions noted  ASSESSMENT AND PLAN: 1. Type 2 diabetes mellitus with hyperglycemia, with long-term current use of insulin ([place]): -Continue  Novolog Flexpen, Lantus, Metformin, and Semaglutide at current dose and refill. -CBG collected  in clinic today. Patient not fasting.  -Will collect A1C. Once results return for A1C medication doses may need to be adjusted. -Ambulatory referral to podiatry as recommended for annual examination of feet in diabetic patients. Patient complains of thick toenails which are hard to cut with a toenail clipper and fungus of toenails since having a left lower extremity irrigation and debridement in July 2020. Counseled patient to follow-up with orthopedic surgeon related to concerns of repeat left lower leg infection. Patient agreeable. -Discussed the importance of healthy eating habits, low-carbohydrate diet, low-sugar diet, regular aerobic exercise (at least 150 minutes a week as tolerated) and medication compliance to achieve or maintain control of diabetes. -To achieve an A1C goal of less than or equal to 7.0 percent, a fasting blood sugar of 80 to 130 mg/dL and a postprandial glucose (90 to 120 minutes after a meal) less than 180 mg/dL. In the event of sugars less than 60 mg/dl or greater than 400 mg/dl please notify the clinic ASAP. It is recommended that you undergo annual eye exams and annual foot exams. -Patient have been counseled extensively about nutrition and exercise. Other issues discussed during this visit include: low cholesterol diet, weight control and daily exercise, foot care, annual eye examinations at Ophthalmology, importance of adherence with medications and regular follow-up. We also discussed long term complications of uncontrolled diabetes and hypertension. -Follow-up with primary provider in 3 months or sooner if needed. - Glucose (CBG) - Hemoglobin A1c - insulin aspart (NOVOLOG FLEXPEN) 100 UNIT/ML FlexPen; Inject 11 Units into the skin 3 (three) times daily with meals.  Dispense: 30 mL; Refill: 1 - Insulin Glargine (BASAGLAR KWIKPEN) 100 UNIT/ML; Inject 0.15 mLs (15 Units total) into  the skin daily.  Dispense: 15 mL; Refill: 2 - metFORMIN (GLUCOPHAGE) 500 MG tablet; Take 2 tablets (1,000 mg total) by mouth 2 (two) times daily.  Dispense: 120 tablet; Refill: 2 - Semaglutide,0.25 or 0.5MG/DOS, (OZEMPIC, 0.25 OR 0.5 MG/DOSE,) 2 MG/1.5ML SOPN; Inject 0.5 mg into the skin once a week.  Dispense: 3 pen; Refill: 1 - Ambulatory referral to Podiatry   2. Essential hypertension: -Blood pressure therapeutic during today's visit.  -Continue Amlodipine, Hydrochlorothiazide, and Losartan at current doses and refill. -Check blood pressure at least once daily and record results. -Counseled on blood pressure goal of less than 130/80, low-sodium, DASH diet, medication compliance, 150 minutes of moderate intensity exercise per week. Discussed medication compliance, adverse effects. -Follow-up with primary physician in 3 months or sooner if needed. - amLODipine (NORVASC) 10 MG tablet; Take 1 tablet (10 mg total) by mouth daily.  Dispense: 90 tablet; Refill: 2 - hydrochlorothiazide (MICROZIDE) 12.5 MG capsule; Take 1 capsule (12.5 mg total) by mouth daily.  Dispense: 30 capsule; Refill: 2 - losartan (COZAAR) 100 MG tablet; Take 1 tablet (100 mg total) by mouth daily.  Dispense: 90 tablet; Refill: 1  3. Hyperlipidemia associated with type 2 diabetes mellitus ([place]): -Continue Atorvastatin at current dose and refill. -Counseled patient on importance of low-fat diet, exercise, and lifestyle modifications. - atorvastatin (LIPITOR) 40 MG tablet; Take 1 tablet (40 mg total) by mouth daily.  Dispense: 30 tablet; Refill: 2  Patient was given the opportunity to ask questions.  Patient verbalized understanding of the plan and was able to repeat key elements of the plan. Patient was given clear instructions to go to Emergency Department or return to medical center if symptoms don't improve, worsen, or new problems develop.The patient verbalized understanding.    Requested Prescriptions  No  prescriptions requested or ordered in this encounter    Hayden Kihara Zachery Dauer, NP

## 2020-03-18 ENCOUNTER — Encounter: Payer: Self-pay | Admitting: Family

## 2020-03-18 ENCOUNTER — Ambulatory Visit: Payer: BC Managed Care – PPO | Attending: Family | Admitting: Family

## 2020-03-18 ENCOUNTER — Other Ambulatory Visit: Payer: Self-pay

## 2020-03-18 VITALS — BP 128/86 | HR 93 | Temp 98.1°F | Ht 69.0 in | Wt 245.8 lb

## 2020-03-18 DIAGNOSIS — I10 Essential (primary) hypertension: Secondary | ICD-10-CM

## 2020-03-18 DIAGNOSIS — Z794 Long term (current) use of insulin: Secondary | ICD-10-CM

## 2020-03-18 DIAGNOSIS — E1169 Type 2 diabetes mellitus with other specified complication: Secondary | ICD-10-CM | POA: Diagnosis not present

## 2020-03-18 DIAGNOSIS — E1165 Type 2 diabetes mellitus with hyperglycemia: Secondary | ICD-10-CM | POA: Diagnosis not present

## 2020-03-18 DIAGNOSIS — E785 Hyperlipidemia, unspecified: Secondary | ICD-10-CM

## 2020-03-18 LAB — GLUCOSE, POCT (MANUAL RESULT ENTRY): POC Glucose: 242 mg/dl — AB (ref 70–99)

## 2020-03-18 MED ORDER — LOSARTAN POTASSIUM 100 MG PO TABS: 100.0000 mg | ORAL_TABLET | Freq: Every day | ORAL | 1 refills | Status: DC

## 2020-03-18 MED ORDER — ATORVASTATIN CALCIUM 40 MG PO TABS: 40.0000 mg | ORAL_TABLET | Freq: Every day | ORAL | 2 refills | Status: DC

## 2020-03-18 MED ORDER — AMLODIPINE BESYLATE 10 MG PO TABS: 10.0000 mg | ORAL_TABLET | Freq: Every day | ORAL | 2 refills | Status: DC

## 2020-03-18 MED ORDER — HYDROCHLOROTHIAZIDE 12.5 MG PO CAPS: 12.5000 mg | ORAL_CAPSULE | Freq: Every day | ORAL | 2 refills | Status: DC

## 2020-03-18 MED ORDER — NOVOLOG FLEXPEN 100 UNIT/ML ~~LOC~~ SOPN: 11.0000 [IU] | PEN_INJECTOR | Freq: Three times a day (TID) | SUBCUTANEOUS | 1 refills | Status: DC

## 2020-03-18 MED ORDER — BASAGLAR KWIKPEN 100 UNIT/ML ~~LOC~~ SOPN: 15.0000 [IU] | PEN_INJECTOR | Freq: Every day | SUBCUTANEOUS | 2 refills | Status: DC

## 2020-03-18 MED ORDER — METFORMIN HCL 500 MG PO TABS: 1000.0000 mg | ORAL_TABLET | Freq: Two times a day (BID) | ORAL | 2 refills | Status: DC

## 2020-03-18 MED ORDER — OZEMPIC (0.25 OR 0.5 MG/DOSE) 2 MG/1.5ML ~~LOC~~ SOPN: 0.5000 mg | PEN_INJECTOR | SUBCUTANEOUS | 1 refills | Status: DC

## 2020-03-18 NOTE — Patient Instructions (Signed)
Continue blood pressure and diabetic medications as prescribed. Follow-up in 3 months or sooner if needed for management of chronic issues. Diabetes Basics  Diabetes (diabetes mellitus) is a long-term (chronic) disease. It occurs when the body does not properly use sugar (glucose) that is released from food after you eat. Diabetes may be caused by one or both of these problems:  Your pancreas does not make enough of a hormone called insulin.  Your body does not react in a normal way to insulin that it makes. Insulin lets sugars (glucose) go into cells in your body. This gives you energy. If you have diabetes, sugars cannot get into cells. This causes high blood sugar (hyperglycemia). Follow these instructions at home: How is diabetes treated? You may need to take insulin or other diabetes medicines daily to keep your blood sugar in balance. Take your diabetes medicines every day as told by your doctor. List your diabetes medicines here: Diabetes medicines  Name of medicine: ______________________________ ? Amount (dose): _______________ Time (a.m./p.m.): _______________ Notes: ___________________________________  Name of medicine: ______________________________ ? Amount (dose): _______________ Time (a.m./p.m.): _______________ Notes: ___________________________________  Name of medicine: ______________________________ ? Amount (dose): _______________ Time (a.m./p.m.): _______________ Notes: ___________________________________ If you use insulin, you will learn how to give yourself insulin by injection. You may need to adjust the amount based on the food that you eat. List the types of insulin you use here: Insulin  Insulin type: ______________________________ ? Amount (dose): _______________ Time (a.m./p.m.): _______________ Notes: ___________________________________  Insulin type: ______________________________ ? Amount (dose): _______________ Time (a.m./p.m.): _______________ Notes:  ___________________________________  Insulin type: ______________________________ ? Amount (dose): _______________ Time (a.m./p.m.): _______________ Notes: ___________________________________  Insulin type: ______________________________ ? Amount (dose): _______________ Time (a.m./p.m.): _______________ Notes: ___________________________________  Insulin type: ______________________________ ? Amount (dose): _______________ Time (a.m./p.m.): _______________ Notes: ___________________________________ How do I manage my blood sugar?  Check your blood sugar levels using a blood glucose monitor as directed by your doctor. Your doctor will set treatment goals for you. Generally, you should have these blood sugar levels:  Before meals (preprandial): 80-130 mg/dL (6.1-6.0 mmol/L).  After meals (postprandial): below 180 mg/dL (10 mmol/L).  A1c level: less than 7%. Write down the times that you will check your blood sugar levels: Blood sugar checks  Time: _______________ Notes: ___________________________________  Time: _______________ Notes: ___________________________________  Time: _______________ Notes: ___________________________________  Time: _______________ Notes: ___________________________________  Time: _______________ Notes: ___________________________________  Time: _______________ Notes: ___________________________________  What do I need to know about low blood sugar? Low blood sugar is called hypoglycemia. This is when blood sugar is at or below 70 mg/dL (3.9 mmol/L). Symptoms may include:  Feeling: ? Hungry. ? Worried or nervous (anxious). ? Sweaty and clammy. ? Confused. ? Dizzy. ? Sleepy. ? Sick to your stomach (nauseous).  Having: ? A fast heartbeat. ? A headache. ? A change in your vision. ? Tingling or no feeling (numbness) around the mouth, lips, or tongue. ? Jerky movements that you cannot control (seizure).  Having trouble with: ? Moving  (coordination). ? Sleeping. ? Passing out (fainting). ? Getting upset easily (irritability). Treating low blood sugar To treat low blood sugar, eat or drink something sugary right away. If you can think clearly and swallow safely, follow the 15:15 rule:  Take 15 grams of a fast-acting carb (carbohydrate). Talk with your doctor about how much you should take.  Some fast-acting carbs are: ? Sugar tablets (glucose pills). Take 3-4 glucose pills. ? 6-8 pieces of hard candy. ? 4-6 oz (120-150 mL) of  fruit juice. ? 4-6 oz (120-150 mL) of regular (not diet) soda. ? 1 Tbsp (15 mL) honey or sugar.  Check your blood sugar 15 minutes after you take the carb.  If your blood sugar is still at or below 70 mg/dL (3.9 mmol/L), take 15 grams of a carb again.  If your blood sugar does not go above 70 mg/dL (3.9 mmol/L) after 3 tries, get help right away.  After your blood sugar goes back to normal, eat a meal or a snack within 1 hour. Treating very low blood sugar If your blood sugar is at or below 54 mg/dL (3 mmol/L), you have very low blood sugar (severe hypoglycemia). This is an emergency. Do not wait to see if the symptoms will go away. Get medical help right away. Call your local emergency services (911 in the [place] Do not drive yourself to the hospital. Questions to ask your health care provider  Do I need to meet with a diabetes educator?  What equipment will I need to care for myself at home?  What diabetes medicines do I need? When should I take them?  How often do I need to check my blood sugar?  What number can I call if I have questions?  When is my next doctor's visit?  Where can I find a support group for people with diabetes? Where to find more information  American Diabetes Association: www.diabetes.org  American Association of Diabetes Educators: www.diabeteseducator.org/patient-resources Contact a doctor if:  Your blood sugar is at or above 240 mg/dL (17.5 mmol/L) for  2 days in a row.  You have been sick or have had a fever for 2 days or more, and you are not getting better.  You have any of these problems for more than 6 hours: ? You cannot eat or drink. ? You feel sick to your stomach (nauseous). ? You throw up (vomit). ? You have watery poop (diarrhea). Get help right away if:  Your blood sugar is lower than 54 mg/dL (3 mmol/L).  You get confused.  You have trouble: ? Thinking clearly. ? Breathing. Summary  Diabetes (diabetes mellitus) is a long-term (chronic) disease. It occurs when the body does not properly use sugar (glucose) that is released from food after digestion.  Take insulin and diabetes medicines as told.  Check your blood sugar every day, as often as told.  Keep all follow-up visits as told by your doctor. This is important. This information is not intended to replace advice given to you by your health care provider. Make sure you discuss any questions you have with your health care provider. Document Revised: 08/05/2019 Document Reviewed: 02/14/2018 Elsevier Patient Education  2020 [employer].

## 2020-03-19 LAB — HEMOGLOBIN A1C
Est. average glucose Bld gHb Est-mCnc: 206 mg/dL
Hgb A1c MFr Bld: 8.8 % — ABNORMAL HIGH (ref 4.8–5.6)

## 2020-03-21 ENCOUNTER — Telehealth: Payer: Self-pay | Admitting: *Deleted

## 2020-03-21 MED ORDER — OZEMPIC (0.25 OR 0.5 MG/DOSE) 2 MG/1.5ML ~~LOC~~ SOPN: 0.5000 mg | PEN_INJECTOR | SUBCUTANEOUS | 1 refills | Status: DC

## 2020-03-21 NOTE — Telephone Encounter (Signed)
Patient would like his Ozampic resent to [facility] off of [address] Please

## 2020-03-21 NOTE — Addendum Note (Signed)
Addended by: Rema Fendt on: 03/21/2020 01:39 PM   Modules accepted: Orders

## 2020-03-21 NOTE — Progress Notes (Signed)
A1C is higher than normal. A1C is 8.8. Normal range being < 7.   Patient should continue Novolog, Ozempic, and Metformin as previously prescribed.   Patient will increase Lantus from 15 units/daily to 18 units/daily.  I have called the patient to inform him of this update.

## 2020-03-22 ENCOUNTER — Emergency Department (HOSPITAL_COMMUNITY)
Admission: EM | Admit: 2020-03-22 | Discharge: 2020-03-22 | Disposition: A | Discharge status: Home / Self Care | Payer: BC Managed Care – PPO | Attending: Emergency Medicine | Admitting: Emergency Medicine

## 2020-03-22 ENCOUNTER — Other Ambulatory Visit: Payer: Self-pay

## 2020-03-22 DIAGNOSIS — I10 Essential (primary) hypertension: Secondary | ICD-10-CM | POA: Diagnosis not present

## 2020-03-22 DIAGNOSIS — R739 Hyperglycemia, unspecified: Secondary | ICD-10-CM

## 2020-03-22 DIAGNOSIS — R202 Paresthesia of skin: Secondary | ICD-10-CM | POA: Diagnosis not present

## 2020-03-22 DIAGNOSIS — Z794 Long term (current) use of insulin: Secondary | ICD-10-CM | POA: Insufficient documentation

## 2020-03-22 DIAGNOSIS — E1165 Type 2 diabetes mellitus with hyperglycemia: Secondary | ICD-10-CM | POA: Diagnosis not present

## 2020-03-22 LAB — BASIC METABOLIC PANEL
Anion gap: 12 (ref 5–15)
BUN: 15 mg/dL (ref 6–20)
CO2: 24 mmol/L (ref 22–32)
Calcium: 9.2 mg/dL (ref 8.9–10.3)
Chloride: 97 mmol/L — ABNORMAL LOW (ref 98–111)
Creatinine, Ser: 1 mg/dL (ref 0.61–1.24)
GFR calc Af Amer: >60 mL/min (ref >60)
GFR calc non Af Amer: >60 mL/min (ref >60)
Glucose, Bld: 434 mg/dL — ABNORMAL HIGH (ref 70–99)
Potassium: 4.1 mmol/L (ref 3.5–5.1)
Sodium: 133 mmol/L — ABNORMAL LOW (ref 135–145)

## 2020-03-22 LAB — CBC
HCT: 50.2 % (ref 39.0–52.0)
Hemoglobin: 17.1 g/dL — ABNORMAL HIGH (ref 13.0–17.0)
MCH: 29.1 pg (ref 26.0–34.0)
MCHC: 34.1 g/dL (ref 30.0–36.0)
MCV: 85.4 fL (ref 80.0–100.0)
Platelets: 192 10*3/uL (ref 150–400)
RBC: 5.88 MIL/uL — ABNORMAL HIGH (ref 4.22–5.81)
RDW: 12.1 % (ref 11.5–15.5)
WBC: 8.2 10*3/uL (ref 4.0–10.5)
nRBC: 0 % (ref 0.0–0.2)

## 2020-03-22 LAB — CBG MONITORING, ED
Glucose-Capillary: 418 mg/dL — ABNORMAL HIGH (ref 70–99)
Glucose-Capillary: 231 mg/dL — ABNORMAL HIGH (ref 70–99)
Glucose-Capillary: 203 mg/dL — ABNORMAL HIGH (ref 70–99)

## 2020-03-22 MED ORDER — SODIUM CHLORIDE 0.9 % IV SOLN: INTRAVENOUS | Status: DC

## 2020-03-22 MED ORDER — INSULIN ASPART 100 UNIT/ML ~~LOC~~ SOLN
10.0000 [IU] | Freq: Once | SUBCUTANEOUS | Status: DC
  Filled 2020-03-22: qty 0.1

## 2020-03-22 MED ORDER — SODIUM CHLORIDE 0.9 % IV BOLUS
2000.0000 mL | Freq: Once | INTRAVENOUS | Status: AC
  Administered 2020-03-22: 2000 mL via INTRAVENOUS

## 2020-03-22 NOTE — ED Provider Notes (Signed)
[facility] DEPT Provider Note   CSN: [id number] Arrival date & time: 03/22/20  9767     History Chief Complaint  Patient presents with  . Hyperglycemia    Anthony Hubbard is a 34 y.o. male.  34 year old male with history of type 2 diabetes presents with hyperglycemia.  Patient states he had diffuse body paresthesias today.  Denies any focal weakness.  No headache.  No emesis.  Had his diabetic medications changed recently.  States compliant with that.  States compliance with his diabetic diet.  Sugar at home was 500.  Took his normal a.m. medications and came here.        Past Medical History:  Diagnosis Date  . Diabetes mellitus without complication ([place])    type 2   . Hypertension   . Hypertensive urgency 06/07/2019  . Phimosis     Patient Active Problem List   Diagnosis Date Noted  . Toenail fungus 07/15/2019  . Bilateral bunions 07/15/2019  . Superficial thrombosis of left lower extremity 06/24/2019  . Type 2 diabetes mellitus with hyperglycemia, with long-term current use of insulin ([place]) 06/07/2019  . Essential hypertension 06/07/2019  . Hypertriglyceridemia 11/20/2017  . Hyperlipidemia associated with type 2 diabetes mellitus ([place]) 06/12/2017    Past Surgical History:  Procedure Laterality Date  . CIRCUMCISION    . I & D EXTREMITY Left 06/10/2019   Procedure: IRRIGATION AND DEBRIDEMENT LEFT LOWER EXTREMITY;  Surgeon: Leandrew Koyanagi, MD;  Location: [facility];  Service: Orthopedics;  Laterality: Left;  . TONSILLECTOMY         Family History  Problem Relation Age of Onset  . Diabetes Father   . Diabetes Paternal Uncle     Social History   Tobacco Use  . Smoking status: Never Smoker  . Smokeless tobacco: Never Used  Substance Use Topics  . Alcohol use: No  . Drug use: Not Currently    Types: Marijuana    Home Medications Prior to Admission medications   Medication Sig Start Date End Date Taking? Authorizing Provider   amLODipine (NORVASC) 10 MG tablet Take 1 tablet (10 mg total) by mouth daily. 03/18/20   Camillia Herter, NP  atorvastatin (LIPITOR) 40 MG tablet Take 1 tablet (40 mg total) by mouth daily. 03/18/20   Camillia Herter, NP  blood glucose meter kit and supplies KIT Dispense based on patient and insurance preference. Use up to four times daily as directed. (FOR ICD-9 250.00, 250.01). 06/15/19   Kathie Dike, MD  docusate sodium (COLACE) 100 MG capsule Take 1 capsule (100 mg total) by mouth 2 (two) times daily. Patient not taking: Reported on 01/12/2020 06/15/19   Kathie Dike, MD  hydrochlorothiazide (MICROZIDE) 12.5 MG capsule Take 1 capsule (12.5 mg total) by mouth daily. 03/18/20   Camillia Herter, NP  ibuprofen (ADVIL) 600 MG tablet Take 1 tablet (600 mg total) by mouth every 8 (eight) hours as needed. 07/15/19   Elsie Stain, MD  insulin aspart (NOVOLOG FLEXPEN) 100 UNIT/ML FlexPen Inject 11 Units into the skin 3 (three) times daily with meals. 03/18/20   Camillia Herter, NP  Insulin Glargine (BASAGLAR KWIKPEN) 100 UNIT/ML Inject 0.15 mLs (15 Units total) into the skin daily. 03/18/20   Camillia Herter, NP  losartan (COZAAR) 100 MG tablet Take 1 tablet (100 mg total) by mouth daily. 03/18/20   Camillia Herter, NP  metFORMIN (GLUCOPHAGE) 500 MG tablet Take 2 tablets (1,000 mg total) by mouth 2 (two)  times daily. 03/18/20   Camillia Herter, NP  polyethylene glycol (MIRALAX / GLYCOLAX) 17 g packet Take 17 g by mouth daily as needed for mild constipation. 06/15/19   Kathie Dike, MD  Semaglutide,0.25 or 0.5MG/DOS, (OZEMPIC, 0.25 OR 0.5 MG/DOSE,) 2 MG/1.5ML SOPN Inject 0.5 mg into the skin once a week. 03/21/20   Camillia Herter, NP  TRUEPLUS 5-BEVEL PEN NEEDLES 31G X 5 MM MISC USE AS DIRECTED FOR LANTUS 11/23/19   Elsie Stain, MD    Allergies    Patient has no known allergies.  Review of Systems   Review of Systems  All other systems reviewed and are negative.   Physical Exam Updated  Vital Signs BP (!) 156/110   Pulse 86   Temp 98.3 F (36.8 C) (Oral)   Resp 16   Ht 1.753 m (_0 )   Wt 111.5 kg   SpO2 100%   BMI 36.30 kg/m   Physical Exam Vitals and nursing note reviewed.  Constitutional:      General: He is not in acute distress.    Appearance: Normal appearance. He is well-developed. He is not toxic-appearing.  HENT:     Head: Normocephalic and atraumatic.  Eyes:     General: Lids are normal.     Conjunctiva/sclera: Conjunctivae normal.     Pupils: Pupils are equal, round, and reactive to light.  Neck:     Thyroid: No thyroid mass.     Trachea: No tracheal deviation.  Cardiovascular:     Rate and Rhythm: Normal rate and regular rhythm.     Heart sounds: Normal heart sounds. No murmur. No gallop.   Pulmonary:     Effort: Pulmonary effort is normal. No respiratory distress.     Breath sounds: Normal breath sounds. No stridor. No decreased breath sounds, wheezing, rhonchi or rales.  Abdominal:     General: Bowel sounds are normal. There is no distension.     Palpations: Abdomen is soft.     Tenderness: There is no abdominal tenderness. There is no rebound.  Musculoskeletal:        General: No tenderness. Normal range of motion.     Cervical back: Normal range of motion and neck supple.  Skin:    General: Skin is warm and dry.     Findings: No abrasion or rash.  Neurological:     Mental Status: He is alert and oriented to person, place, and time.     GCS: GCS eye subscore is 4. GCS verbal subscore is 5. GCS motor subscore is 6.     Cranial Nerves: No cranial nerve deficit.     Sensory: No sensory deficit.  Psychiatric:        Speech: Speech normal.        Behavior: Behavior normal.     ED Results / Procedures / Treatments   Labs (all labs ordered are listed, but only abnormal results are displayed) Labs Reviewed  BASIC METABOLIC PANEL - Abnormal; Notable for the following components:      Result Value   Sodium 133 (*)    Chloride 97 (*)     Glucose, Bld 434 (*)    All other components within normal limits  CBC - Abnormal; Notable for the following components:   RBC 5.88 (*)    Hemoglobin 17.1 (*)    All other components within normal limits  CBG MONITORING, ED - Abnormal; Notable for the following components:   Glucose-Capillary 418 (*)  All other components within normal limits  CBG MONITORING, ED - Abnormal; Notable for the following components:   Glucose-Capillary 231 (*)    All other components within normal limits  URINALYSIS, ROUTINE W REFLEX MICROSCOPIC  CBG MONITORING, ED  CBG MONITORING, ED    EKG None  Radiology No results found.  Procedures Procedures (including critical care time)  Medications Ordered in ED Medications  insulin aspart (novoLOG) injection 10 Units (10 Units Subcutaneous Not Given 03/22/20 1223)  0.9 %  sodium chloride infusion (has no administration in time range)  sodium chloride 0.9 % bolus 2,000 mL (2,000 mLs Intravenous New Bag/Given 03/22/20 1223)    ED Course  I have reviewed the triage vital signs and the nursing notes.  Pertinent labs & imaging results that were available during my care of the patient were reviewed by me and considered in my medical decision making (see chart for details).    MDM Rules/Calculators/A&P                       Final Clinical Impression(s) / ED Diagnoses Final diagnoses:  None  Patient's blood sugar here is stable after receiving 2 L of fluid.  Will discharge home  Rx / DC Orders ED Discharge Orders    None       Lacretia Leigh, MD 03/22/20 1338

## 2020-03-22 NOTE — ED Triage Notes (Signed)
Per patient he woke up this morning and his whole body felt numb, says he drove to work and kept falling asleep. Checked his CBG- and he states it was 500. No insulin coverage today, CBG in triage 418. Patient states he has meal coverage and lantus at night. Patient reports he took lantus last night as scheduled.

## 2020-03-22 NOTE — ED Notes (Signed)
CBG=418 

## 2020-03-29 ENCOUNTER — Other Ambulatory Visit: Payer: Self-pay

## 2020-03-29 ENCOUNTER — Ambulatory Visit: Payer: BC Managed Care – PPO | Attending: Critical Care Medicine | Admitting: Critical Care Medicine

## 2020-03-29 ENCOUNTER — Telehealth: Payer: Self-pay | Admitting: Critical Care Medicine

## 2020-03-29 ENCOUNTER — Encounter: Payer: Self-pay | Admitting: Critical Care Medicine

## 2020-03-29 VITALS — BP 145/89 | Ht 70.0 in | Wt 245.0 lb

## 2020-03-29 DIAGNOSIS — Z794 Long term (current) use of insulin: Secondary | ICD-10-CM | POA: Diagnosis not present

## 2020-03-29 DIAGNOSIS — E1169 Type 2 diabetes mellitus with other specified complication: Secondary | ICD-10-CM | POA: Diagnosis not present

## 2020-03-29 DIAGNOSIS — E1165 Type 2 diabetes mellitus with hyperglycemia: Secondary | ICD-10-CM

## 2020-03-29 DIAGNOSIS — E785 Hyperlipidemia, unspecified: Secondary | ICD-10-CM

## 2020-03-29 DIAGNOSIS — I10 Essential (primary) hypertension: Secondary | ICD-10-CM

## 2020-03-29 DIAGNOSIS — E781 Pure hyperglyceridemia: Secondary | ICD-10-CM

## 2020-03-29 LAB — GLUCOSE, POCT (MANUAL RESULT ENTRY): POC Glucose: 243 mg/dl — AB (ref 70–99)

## 2020-03-29 MED ORDER — METFORMIN HCL 500 MG PO TABS: 1000.0000 mg | ORAL_TABLET | Freq: Two times a day (BID) | ORAL | 2 refills | Status: DC

## 2020-03-29 MED ORDER — OZEMPIC (0.25 OR 0.5 MG/DOSE) 2 MG/1.5ML ~~LOC~~ SOPN: 1.0000 mg | PEN_INJECTOR | SUBCUTANEOUS | 1 refills | Status: DC

## 2020-03-29 MED ORDER — FREESTYLE LIBRE 2 SENSOR MISC: 6 refills | Status: DC

## 2020-03-29 MED ORDER — HYDROCHLOROTHIAZIDE 12.5 MG PO CAPS: 12.5000 mg | ORAL_CAPSULE | Freq: Every day | ORAL | 2 refills | Status: DC

## 2020-03-29 MED ORDER — FREESTYLE LIBRE 2 READER DEVI: 6 refills | Status: DC

## 2020-03-29 MED ORDER — ATORVASTATIN CALCIUM 40 MG PO TABS: 40.0000 mg | ORAL_TABLET | Freq: Every day | ORAL | 2 refills | Status: DC

## 2020-03-29 MED ORDER — BASAGLAR KWIKPEN 100 UNIT/ML ~~LOC~~ SOPN: 20.0000 [IU] | PEN_INJECTOR | Freq: Every day | SUBCUTANEOUS | 2 refills | Status: DC

## 2020-03-29 NOTE — Progress Notes (Signed)
Subjective:    Patient ID: Anthony Hubbard, male    DOB: 1985-12-04, 34 y.o.   MRN: [id number]  34 y.o.M with type 2 diabetes and hypertension and recent admission for left lower extremity cellulitis and abscess with need for drainage per orthopedics  This is a follow-up visit for hypertension type 2 diabetes and recent admission for left lower extremity cellulitis and abscess with drainage.  The patient's lower extremity is improving and wound care has been ongoing per orthopedics.  Recent notes from orthopedics suggest the wound is slowly healing.  The patient still on Keflex for antibiotic coverage of gram-positive cocci from the cultures.  The patient maintains the Lantus and also now on Trulicity.  Blood sugars have been ranging in the lower area of 1 20-1 40 at home.  The patient is due tetanus vaccine and flu shot.  Patient also needs a foot exam as well as microalbumin check in the urine.  Note the patient has still some pain particular with dressing changes on the wound.  11/17/2019 This is a follow-up visit from a September 2020 visit.  The patient is being treated for type 2 diabetes and hyperlipidemia.  Patient also had cellulitis and abscess of the left leg.  The cellulitis and abscess have resolved and the wound is healed.  The patient is now off antibiotics.  The patient is being followed for diabetes and hypertension.  Noted home he states his blood pressures in the 140/80 range.  His blood sugars have been in the 1 20-1 40 range fasting and then get up to mid 200s postprandial  He has not been compliant with his diet.  Note today's hemoglobin A1c is 11 and his CBG is 260  The patient tends to eat a lot in different times and does not take snacks that are healthy in for him between meals but instead eats unhealthy snacks  01/12/2020 The patient returns today for follow-up and continues to run high sugars although not as high as previous visits.  His fastings were in the 200  range postprandial 300.  Previously he was fasting in the 450 range.  He is not been able to access his NovoLog due to insurance barriers.  He is on the Lantus at 55 units daily and is on Metformin at 1000 mg twice daily as well now is on Ozempic 0.5 weekly  Blood pressure showing some improved control  We have been giving the patient counseling with regards to his diet   There are no other new complaints   03/29/2020 This patient is seen as a work in visit as he had gone to the ER recently with hyperglycemia again.  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  Initially was working the first shift going in at 4:30 in the morning getting off at 2:30 in the afternoon in the situation he would skip his breakfast.  At other times he is shifted over to an afternoon shift going into work at 2:30 PM getting off at 10:30 PM in that circumstance he found himself skipping dinner or going out and having fast food.  He states normally his blood sugars are 120 fasting and 180 after eating more recently they have been increasing since eating more fast food and skipping meals  He is to be on Lantus 15 units daily Ozempic 1 injection weekly and NovoLog 11 units 3 times a day with  meals but he has been skipping meals    Past Medical History:  Diagnosis Date  . Diabetes mellitus without complication ([place])    type 2   . Hypertension   . Hypertensive urgency 06/07/2019  . Phimosis      Family History  Problem Relation Age of Onset  . Diabetes Father   . Diabetes Paternal Uncle      Social History   Socioeconomic History  . Marital status: Married    Spouse name: Not on file  . Number of children: Not on file  . Years of education: Not on file  . Highest education level: Not on file  Occupational History  . Not on file  Tobacco Use  . Smoking status: Never Smoker  . Smokeless tobacco: Never Used    Substance and Sexual Activity  . Alcohol use: No  . Drug use: Not Currently    Types: Marijuana  . Sexual activity: Not on file  Other Topics Concern  . Not on file  Social History Narrative  . Not on file   Social Determinants of Health   Financial Resource Strain:   . Difficulty of Paying Living Expenses:   Food Insecurity:   . Worried About Charity fundraiser in the Last Year:   . Arboriculturist in the Last Year:   Transportation Needs:   . Film/video editor (Medical):   Marland Kitchen Lack of Transportation (Non-Medical):   Physical Activity:   . Days of Exercise per Week:   . Minutes of Exercise per Session:   Stress:   . Feeling of Stress :   Social Connections:   . Frequency of Communication with Friends and Family:   . Frequency of Social Gatherings with Friends and Family:   . Attends Religious Services:   . Active Member of Clubs or Organizations:   . Attends Archivist Meetings:   Marland Kitchen Marital Status:   Intimate Partner Violence:   . Fear of Current or Ex-Partner:   . Emotionally Abused:   Marland Kitchen Physically Abused:   . Sexually Abused:      No Known Allergies   Outpatient Medications Prior to Visit  Medication Sig Dispense Refill  . amLODipine (NORVASC) 10 MG tablet Take 1 tablet (10 mg total) by mouth daily. 90 tablet 2  . blood glucose meter kit and supplies KIT Dispense based on patient and insurance preference. Use up to four times daily as directed. (FOR ICD-9 250.00, 250.01). 1 each 0  . insulin aspart (NOVOLOG FLEXPEN) 100 UNIT/ML FlexPen Inject 11 Units into the skin 3 (three) times daily with meals. 30 mL 1  . losartan (COZAAR) 100 MG tablet Take 1 tablet (100 mg total) by mouth daily. 90 tablet 1  . TRUEPLUS 5-BEVEL PEN NEEDLES 31G X 5 MM MISC USE AS DIRECTED FOR LANTUS 100 each 0  . atorvastatin (LIPITOR) 40 MG tablet Take 1 tablet (40 mg total) by mouth daily. 30 tablet 2  . hydrochlorothiazide (MICROZIDE) 12.5 MG capsule Take 1 capsule (12.5 mg  total) by mouth daily. 30 capsule 2  . Insulin Glargine (BASAGLAR KWIKPEN) 100 UNIT/ML Inject 0.15 mLs (15 Units total) into the skin daily. 15 mL 2  . metFORMIN (GLUCOPHAGE) 500 MG tablet Take 2 tablets (1,000 mg total) by mouth 2 (two) times daily. 120 tablet 2  . Semaglutide,0.25 or 0.5MG/DOS, (OZEMPIC, 0.25 OR 0.5 MG/DOSE,) 2 MG/1.5ML SOPN Inject 0.5 mg into the skin once a week. 3 pen 1  .  ibuprofen (ADVIL) 600 MG tablet Take 1 tablet (600 mg total) by mouth every 8 (eight) hours as needed. (Patient not taking: Reported on 03/29/2020) 60 tablet 1  . polyethylene glycol (MIRALAX / GLYCOLAX) 17 g packet Take 17 g by mouth daily as needed for mild constipation. (Patient not taking: Reported on 03/29/2020) 14 each 0  . docusate sodium (COLACE) 100 MG capsule Take 1 capsule (100 mg total) by mouth 2 (two) times daily. (Patient not taking: Reported on 03/29/2020) 10 capsule 0   No facility-administered medications prior to visit.    Review of Systems  Constitutional:   No  weight loss, night sweats,  Fevers, chills, fatigue, lassitude. HEENT:   No headaches,  Difficulty swallowing,  Tooth/dental problems,  Sore throat,                No sneezing, itching, ear ache, nasal congestion, post nasal drip,   CV:  No chest pain,  Orthopnea, PND, swelling in lower extremities, anasarca, dizziness, palpitations  GI  No heartburn, indigestion, abdominal pain, nausea, vomiting, diarrhea, change in bowel habits, loss of appetite  Resp: No shortness of breath with exertion or at rest.  No excess mucus, no productive cough,  No non-productive cough,  No coughing up of blood.  No change in color of mucus.  No wheezing.  No chest wall deformity  Skin: no rash or lesions.  GU: no dysuria, change in color of urine, no urgency or frequency.  No flank pain.  MS:  No joint pain or swelling.  No decreased range of motion.  No back pain.  Psych:  No change in mood or affect. No depression or anxiety.  No memory  loss.     Objective:   Physical Exam BP (!) 145/89 (BP Location: Left Arm, Patient Position: Sitting)   Ht _0  (1.778 m)   Wt 245 lb (111.1 kg)   BMI 35.15 kg/m   Gen: Pleasant, well-nourished, in no distress,  normal affect  ENT: No lesions,  mouth clear,  oropharynx clear, no postnasal drip  Neck: No JVD, no TMG, no carotid bruits  Lungs: No use of accessory muscles, no dullness to percussion, clear without rales or rhonchi  Cardiovascular: RRR, heart sounds normal, no murmur or gallops, no peripheral edema  Abdomen: soft and NT, no HSM,  BS normal  Musculoskeletal: slight swelling left lateral calf no evidence infection or DVT Neuro: alert, non focal  Skin: Warm, no lesions or rashes   CBC Latest Ref Rng & Units 03/22/2020 01/19/2020 06/24/2019  WBC 4.0 - 10.5 K/uL 8.2 8.4 9.4  Hemoglobin 13.0 - 17.0 g/dL 17.1(H) 17.6(H) 16.0  Hematocrit 39.0 - 52.0 % 50.2 50.9 47.9  Platelets 150 - 400 K/uL 192 191 286   BMP Latest Ref Rng & Units 03/22/2020 01/19/2020 11/17/2019  Glucose 70 - 99 mg/dL 434(H) 384(H) 315(H)  BUN 6 - 20 mg/dL _1 Creatinine 0.61 - 1.24 mg/dL 1.00 1.18 1.06  BUN/Creat Ratio 9 - 20 - - 11  Sodium 135 - 145 mmol/L 133(L) 135 135  Potassium 3.5 - 5.1 mmol/L 4.1 4.2 4.4  Chloride 98 - 111 mmol/L 97(L) 100 98  CO2 22 - 32 mmol/L _2 Calcium 8.9 - 10.3 mg/dL 9.2 9.1 9.4  Lipid Panel     Component Value Date/Time   CHOL 169 06/24/2019 1052   TRIG 223 (H) 06/24/2019 1052   HDL 35 (L) 06/24/2019 1052   CHOLHDL 4.8 06/24/2019 1052  [place] 89 06/24/2019 1052   Lab Results  Component Value Date   HGBA1C 8.8 (H) 03/18/2020   cbg 243      Assessment & Plan:  I personally reviewed all images and lab data in the [facility] system as well as any outside material available during this office visit and agree with the  radiology impressions.   Type 2 diabetes mellitus with hyperglycemia, with long-term current use of insulin (HCC) Type 2 DM not  well controlled due to missing meals, not adhering to DM diet.    Reinstructed as to proper meal plan and will make referral to DM nutritionist  Increase lantus to 20units daily Increase ozempic to 80m weekly Cont tid novolog 11 units with meals Return two weeks with CPP and 1 month with MD  Essential hypertension HTN with adequate control   No change meds  Hyperlipidemia associated with type 2 diabetes mellitus ([place] Cont atorvastitn    CRodmanwas seen today for hospitalization follow-up.  Diagnoses and all orders for this visit:  Type 2 diabetes mellitus with hyperglycemia, with long-term current use of insulin (HCC) -     POCT glucose (manual entry) -     Amb ref to Medical Nutrition Therapy-MNT -     metFORMIN (GLUCOPHAGE) 500 MG tablet; Take 2 tablets (1,000 mg total) by mouth 2 (two) times daily. -     Semaglutide,0.25 or 0.5MG/DOS, (OZEMPIC, 0.25 OR 0.5 MG/DOSE,) 2 MG/1.5ML SOPN; Inject 1 mg into the skin once a week. -     Insulin Glargine (BASAGLAR KWIKPEN) 100 UNIT/ML; Inject 0.2 mLs (20 Units total) into the skin daily.  Hyperlipidemia associated with type 2 diabetes mellitus ([place] -     Amb ref to Medical Nutrition Therapy-MNT -     atorvastatin (LIPITOR) 40 MG tablet; Take 1 tablet (40 mg total) by mouth daily.  Essential hypertension -     Amb ref to Medical Nutrition Therapy-MNT -     hydrochlorothiazide (MICROZIDE) 12.5 MG capsule; Take 1 capsule (12.5 mg total) by mouth daily.  Hypertriglyceridemia  Other orders -     Continuous Blood Gluc Receiver (FREESTYLE LIBRE 2 READER) DEVI; Use to monitor blood sugar -     Continuous Blood Gluc Sensor (FREESTYLE LIBRE 2 SENSOR) MISC; Use to monitor blood sugar  The patient knows to obtain an eye exam in the next few months

## 2020-03-29 NOTE — Assessment & Plan Note (Signed)
HTN with adequate control   No change meds

## 2020-03-29 NOTE — Patient Instructions (Addendum)
Obtain Eye exam  Obtain a Covid vaccine, see below  COVID-19 Vaccine Information can be found at: [email or web address] For questions related to vaccine distribution or appointments, please email [email or web address] or call [phone].    Refills on all medications [facility]  I sent a prescription for a libre blood sugar monitoring system, need to see if your insurance will cover  Lantus:   20 units daily  Novolog:  11 units three times a day with meals  Oxempic increase to 1 mg weekly     A nutritionist appointment will be made  Return Dr Delford Field in 1 month and [name] in two weeks

## 2020-03-29 NOTE — Telephone Encounter (Signed)
I spoke to the patient,  He needs to be added on today at 330p  I told him to arrive at 315p  He needs a CBG on arrival

## 2020-03-29 NOTE — Telephone Encounter (Signed)
Pt added for today.

## 2020-03-29 NOTE — Progress Notes (Signed)
Here for Hospital f /u   CBG 243

## 2020-03-29 NOTE — Assessment & Plan Note (Signed)
Cont atorvastitn

## 2020-03-29 NOTE — Assessment & Plan Note (Signed)
Type 2 DM not well controlled due to missing meals, not adhering to DM diet.    Reinstructed as to proper meal plan and will make referral to DM nutritionist  Increase lantus to 20units daily Increase ozempic to 1mg  weekly Cont tid novolog 11 units with meals Return two weeks with CPP and 1 month with MD

## 2020-03-30 ENCOUNTER — Other Ambulatory Visit: Payer: Self-pay | Admitting: Critical Care Medicine

## 2020-03-30 DIAGNOSIS — Z794 Long term (current) use of insulin: Secondary | ICD-10-CM

## 2020-03-30 DIAGNOSIS — E1165 Type 2 diabetes mellitus with hyperglycemia: Secondary | ICD-10-CM

## 2020-03-30 MED ORDER — OZEMPIC (1 MG/DOSE) 2 MG/1.5ML ~~LOC~~ SOPN: 1.0000 mg | PEN_INJECTOR | SUBCUTANEOUS | 4 refills | Status: DC

## 2020-04-05 ENCOUNTER — Ambulatory Visit: Payer: BC Managed Care – PPO | Admitting: Critical Care Medicine

## 2020-04-12 ENCOUNTER — Ambulatory Visit: Payer: BC Managed Care – PPO | Admitting: Pharmacist

## 2020-04-12 ENCOUNTER — Ambulatory Visit: Payer: BC Managed Care – PPO | Attending: Critical Care Medicine | Admitting: Pharmacist

## 2020-04-12 ENCOUNTER — Other Ambulatory Visit: Payer: Self-pay

## 2020-04-12 DIAGNOSIS — E1165 Type 2 diabetes mellitus with hyperglycemia: Secondary | ICD-10-CM | POA: Diagnosis not present

## 2020-04-12 DIAGNOSIS — Z794 Long term (current) use of insulin: Secondary | ICD-10-CM | POA: Diagnosis not present

## 2020-04-12 NOTE — Progress Notes (Signed)
    S:    PCP: Dr. Delford Field   No chief complaint on file.  Patient arrives good spirits.  Presents for diabetes evaluation, education, and management Patient was referred and last seen by Primary Care Provider on 03/29/2020.   Patient reports diabetes was diagnosed several years ago.  Family/Social History: DM (father); never smoker, denies alcohol intake.  Insurance coverage/medication affordability: self-pay   Patient reports adherence with medications.  Current diabetes medications include: Novolog 11 units TID (taking 15 units TID), Basaglar 20 units daily, metformin 1000 mg BID, Ozempic 1 mg weekly   Patient denies hypoglycemic events.  Patient reported dietary habits: eats fish and vegetables, everything cooked in the oven, no oils. Drinks water and green tea w/o sugar. Patient-reported exercise habits: walks around apartment complex for ~1hr every day.   Patient denies nocturia.  Patient denies neuropathy. Patient denies visual changes. Patient reports self foot exams.    O:  POCT glucose: 205.  Patient reports drastic improvement in home glycemic control. Verified this with his CGM.  Home fasting CBG: per patient, 120-130s Home post-prandial/random CBGs: 180s-200s  Clinical ASCVD: No  The ASCVD Risk score Denman George DC Jr., et al., 2013) failed to calculate for the following reasons:   The 2013 ASCVD risk score is only valid for ages 83 to 33   A/P: Diabetes longstanding currently uncontrolled based on A1c but home CBGs are improving. Patient is able to verbalize appropriate hypoglycemia management plan. Confirms adherence to medications. Of note, he is injecting Novolog before meals. Will hold on dose changes and have him take Novolog before meals.   -Continue current regimen. -Inject Novolog 15 minutes before meals.  -Extensively discussed pathophysiology of DM, recommended lifestyle interventions, dietary effects on glycemic control -Counseled on s/sx of and management  of hypoglycemia -A1c due 05/2020  Written patient instructions provided.  Total time in face to face counseling 30 minutes.   Follow up with Dr. Delford Field in July.  Butch Penny, PharmD, CPP Clinical Pharmacist [facility] [phone]

## 2020-04-19 ENCOUNTER — Encounter: Payer: Self-pay | Admitting: Podiatry

## 2020-04-19 ENCOUNTER — Ambulatory Visit (INDEPENDENT_AMBULATORY_CARE_PROVIDER_SITE_OTHER): Payer: BC Managed Care – PPO | Admitting: Podiatry

## 2020-04-19 ENCOUNTER — Other Ambulatory Visit: Payer: Self-pay

## 2020-04-19 DIAGNOSIS — Z79899 Other long term (current) drug therapy: Secondary | ICD-10-CM

## 2020-04-19 DIAGNOSIS — B351 Tinea unguium: Secondary | ICD-10-CM | POA: Diagnosis not present

## 2020-04-19 DIAGNOSIS — E1165 Type 2 diabetes mellitus with hyperglycemia: Secondary | ICD-10-CM | POA: Diagnosis not present

## 2020-04-19 NOTE — Progress Notes (Signed)
Subjective:   Patient ID: Anthony Hubbard, male   DOB: 34 y.o.   MRN: [id number]   HPI 34 year old male presents For Diabetic Foot Exam as well as toenail fungus.  He currently denies any ulceration he does have a history of infection of the left leg.  He states he is still getting feeling back from having surgery in the infection.  He does get some discomfort to the toenails most of the big toenails most of the base of the nail but denies any redness or drainage or any swelling.  Is been ongoing for about the last 2 years no significant treatment for the nails.  Has been diabetic the last 4 years. His last A1c was 8.8 on March 18, 2020.   Review of Systems  All other systems reviewed and are negative.  Past Medical History:  Diagnosis Date  . Diabetes mellitus without complication ([place])    type 2   . Hypertension   . Hypertensive urgency 06/07/2019  . Phimosis     Past Surgical History:  Procedure Laterality Date  . CIRCUMCISION    . I & D EXTREMITY Left 06/10/2019   Procedure: IRRIGATION AND DEBRIDEMENT LEFT LOWER EXTREMITY;  Surgeon: Leandrew Koyanagi, MD;  Location: [facility];  Service: Orthopedics;  Laterality: Left;  . TONSILLECTOMY       Current Outpatient Medications:  .  amLODipine (NORVASC) 10 MG tablet, Take 1 tablet (10 mg total) by mouth daily., Disp: 90 tablet, Rfl: 2 .  atorvastatin (LIPITOR) 40 MG tablet, Take 1 tablet (40 mg total) by mouth daily., Disp: 30 tablet, Rfl: 2 .  blood glucose meter kit and supplies KIT, Dispense based on patient and insurance preference. Use up to four times daily as directed. (FOR ICD-9 250.00, 250.01)., Disp: 1 each, Rfl: 0 .  Continuous Blood Gluc Receiver (FREESTYLE LIBRE 2 READER) DEVI, Use to monitor blood sugar, Disp: 1 each, Rfl: 6 .  Continuous Blood Gluc Sensor (FREESTYLE LIBRE 2 SENSOR) MISC, Use to monitor blood sugar, Disp: 1 each, Rfl: 6 .  hydrochlorothiazide (MICROZIDE) 12.5 MG capsule, Take 1 capsule (12.5 mg total) by mouth  daily., Disp: 30 capsule, Rfl: 2 .  ibuprofen (ADVIL) 600 MG tablet, Take 1 tablet (600 mg total) by mouth every 8 (eight) hours as needed. (Patient not taking: Reported on 03/29/2020), Disp: 60 tablet, Rfl: 1 .  insulin aspart (NOVOLOG FLEXPEN) 100 UNIT/ML FlexPen, Inject 11 Units into the skin 3 (three) times daily with meals., Disp: 30 mL, Rfl: 1 .  Insulin Glargine (BASAGLAR KWIKPEN) 100 UNIT/ML, Inject 0.2 mLs (20 Units total) into the skin daily., Disp: 15 mL, Rfl: 2 .  losartan (COZAAR) 100 MG tablet, Take 1 tablet (100 mg total) by mouth daily., Disp: 90 tablet, Rfl: 1 .  metFORMIN (GLUCOPHAGE) 500 MG tablet, Take 2 tablets (1,000 mg total) by mouth 2 (two) times daily., Disp: 120 tablet, Rfl: 2 .  polyethylene glycol (MIRALAX / GLYCOLAX) 17 g packet, Take 17 g by mouth daily as needed for mild constipation. (Patient not taking: Reported on 03/29/2020), Disp: 14 each, Rfl: 0 .  Semaglutide, 1 MG/DOSE, (OZEMPIC, 1 MG/DOSE,) 2 MG/1.5ML SOPN, Inject 1 mg into the skin once a week., Disp: 4 pen, Rfl: 4 .  TRUEPLUS 5-BEVEL PEN NEEDLES 31G X 5 MM MISC, USE AS DIRECTED FOR LANTUS, Disp: 100 each, Rfl: 0  No Known Allergies       Objective:  Physical Exam  General: AAO x3, NAD  Dermatological: Nails are  hypertrophic, dystrophic, brittle, discolored, elongated 10. No surrounding redness or drainage.  The nails 1-5 bilaterally do cause irritation with shoes. No open lesions or pre-ulcerative lesions are identified today.  Scar in the left leg without any ulceration or signs of infection  Vascular: Dorsalis Pedis artery and Posterior Tibial artery pedal pulses are 2/4 bilateral with immedate capillary fill time.  There is no pain with calf compression, swelling, warmth, erythema.   Neruologic: Grossly intact via light touch bilateral.   Musculoskeletal: No gross boney pedal deformities bilateral. No pain, crepitus, or limitation noted with foot and ankle range of motion bilateral. Muscular  strength 5/5 in all groups tested bilateral.  Gait: Unassisted, Nonantalgic.       Assessment:   Onychomycosis, uncontrolled type 2 diabetes     Plan:  -Treatment options discussed including all alternatives, risks, and complications -Etiology of symptoms were discussed -As a courtesy debrided the nails but any complications or bleeding.  Discussed treatment options for the nail fungus.  As there is currently no significant pain with any other signs of infection can hold off on nail removal.  We discussed treatment options for nail fungus and was to proceed with oral therapy, Lamisil.  I ordered a CBC and LFT to check prior to starting the medication we discussed side effects.  Discussed the importance daily foot inspection.  Return in about 6 weeks (around 05/31/2020) for Lamisil check .  Trula Slade DPM

## 2020-04-19 NOTE — Patient Instructions (Signed)
Terbinafine oral granules What is this medicine? TERBINAFINE (TER bin a feen) is an antifungal medicine. It is used to treat certain kinds of fungal or yeast infections. This medicine may be used for other purposes; ask your health care provider or pharmacist if you have questions. COMMON BRAND NAME(S): Lamisil What should I tell my health care provider before I take this medicine? They need to know if you have any of these conditions:  drink alcoholic beverages  kidney disease  liver disease  an unusual or allergic reaction to Terbinafine, other medicines, foods, dyes, or preservatives  pregnant or trying to get pregnant  breast-feeding How should I use this medicine? Take this medicine by mouth. Follow the directions on the prescription label. Hold packet with cut line on top. Shake packet gently to settle contents. Tear packet open along cut line, or use scissors to cut across line. Carefully pour the entire contents of packet onto a spoonful of a soft food, such as pudding or other soft, non-acidic food such as mashed potatoes (do NOT use applesauce or a fruit-based food). If two packets are required for each dose, you may either sprinkle the content of both packets on one spoonful of non-acidic food, or sprinkle the contents of both packets on two spoonfuls of non-acidic food. Make sure that no granules remain in the packet. Swallow the mxiture of the food and granules without chewing. Take your medicine at regular intervals. Do not take it more often than directed. Take all of your medicine as directed even if you think you are better. Do not skip doses or stop your medicine early. Contact your pediatrician or health care professional regarding the use of this medicine in children. While this medicine may be prescribed for children as young as 4 years for selected conditions, precautions do apply. Overdosage: If you think you have taken too much of this medicine contact a poison control  center or emergency room at once. NOTE: This medicine is only for you. Do not share this medicine with others. What if I miss a dose? If you miss a dose, take it as soon as you can. If it is almost time for your next dose, take only that dose. Do not take double or extra doses. What may interact with this medicine? Do not take this medicine with any of the following medications:  thioridazine This medicine may also interact with the following medications:  beta-blockers  caffeine  cimetidine  cyclosporine  MAOIs like Carbex, Eldepryl, Marplan, Nardil, and Parnate  medicines for fungal infections like fluconazole and ketoconazole  medicines for irregular heartbeat like amiodarone, flecainide and propafenone  rifampin  SSRIs like citalopram, escitalopram, fluoxetine, fluvoxamine, paroxetine and sertraline  tricyclic antidepressants like amitriptyline, clomipramine, desipramine, imipramine, nortriptyline, and others  warfarin This list may not describe all possible interactions. Give your health care provider a list of all the medicines, herbs, non-prescription drugs, or dietary supplements you use. Also tell them if you smoke, drink alcohol, or use illegal drugs. Some items may interact with your medicine. What should I watch for while using this medicine? Your doctor may monitor your liver function. Tell your doctor right away if you have nausea or vomiting, loss of appetite, stomach pain on your right upper side, yellow skin, dark urine, light stools, or are over tired. This medicine may cause serious skin reactions. They can happen weeks to months after starting the medicine. Contact your health care provider right away if you notice fevers or flu-like symptoms   with a rash. The rash may be red or purple and then turn into blisters or peeling of the skin. Or, you might notice a red rash with swelling of the face, lips or lymph nodes in your neck or under your arms. You need to take  this medicine for 6 weeks or longer to cure the fungal infection. Take your medicine regularly for as long as your doctor or health care provider tells you to. What side effects may I notice from receiving this medicine? Side effects that you should report to your doctor or health care professional as soon as possible:  allergic reactions like skin rash or hives, swelling of the face, lips, or tongue  change in vision  dark urine  fever or infection  general ill feeling or flu-like symptoms  light-colored stools  loss of appetite, nausea  rash, fever, and swollen lymph nodes  redness, blistering, peeling or loosening of the skin, including inside the mouth  right upper belly pain  unusually weak or tired  yellowing of the eyes or skin Side effects that usually do not require medical attention (report to your doctor or health care professional if they continue or are bothersome):  changes in taste  diarrhea  hair loss  muscle or joint pain  stomach upset This list may not describe all possible side effects. Call your doctor for medical advice about side effects. You may report side effects to FDA at 1-800-FDA-1088. Where should I keep my medicine? Keep out of the reach of children. Store at room temperature between 15 and 30 degrees C (59 and 86 degrees F). Throw away any unused medicine after the expiration date. NOTE: This sheet is a summary. It may not cover all possible information. If you have questions about this medicine, talk to your doctor, pharmacist, or health care provider.  2020 Elsevier/Gold Standard (2019-02-20 15:35:11)   Diabetes Mellitus and Foot Care Foot care is an important part of your health, especially when you have diabetes. Diabetes may cause you to have problems because of poor blood flow (circulation) to your feet and legs, which can cause your skin to:  Become thinner and drier.  Break more easily.  Heal more slowly.  Peel and  crack. You may also have nerve damage (neuropathy) in your legs and feet, causing decreased feeling in them. This means that you may not notice minor injuries to your feet that could lead to more serious problems. Noticing and addressing any potential problems early is the best way to prevent future foot problems. How to care for your feet Foot hygiene  Wash your feet daily with warm water and mild soap. Do not use hot water. Then, pat your feet and the areas between your toes until they are completely dry. Do not soak your feet as this can dry your skin.  Trim your toenails straight across. Do not dig under them or around the cuticle. File the edges of your nails with an emery board or nail file.  Apply a moisturizing lotion or petroleum jelly to the skin on your feet and to dry, brittle toenails. Use lotion that does not contain alcohol and is unscented. Do not apply lotion between your toes. Shoes and socks  Wear clean socks or stockings every day. Make sure they are not too tight. Do not wear knee-high stockings since they may decrease blood flow to your legs.  Wear shoes that fit properly and have enough cushioning. Always look in your shoes before you put   them on to be sure there are no objects inside.  To break in new shoes, wear them for just a few hours a day. This prevents injuries on your feet. Wounds, scrapes, corns, and calluses  Check your feet daily for blisters, cuts, bruises, sores, and redness. If you cannot see the bottom of your feet, use a mirror or ask someone for help.  Do not cut corns or calluses or try to remove them with medicine.  If you find a minor scrape, cut, or break in the skin on your feet, keep it and the skin around it clean and dry. You may clean these areas with mild soap and water. Do not clean the area with peroxide, alcohol, or iodine.  If you have a wound, scrape, corn, or callus on your foot, look at it several times a day to make sure it is  healing and not infected. Check for: ? Redness, swelling, or pain. ? Fluid or blood. ? Warmth. ? Pus or a bad smell. General instructions  Do not cross your legs. This may decrease blood flow to your feet.  Do not use heating pads or hot water bottles on your feet. They may burn your skin. If you have lost feeling in your feet or legs, you may not know this is happening until it is too late.  Protect your feet from hot and cold by wearing shoes, such as at the beach or on hot pavement.  Schedule a complete foot exam at least once a year (annually) or more often if you have foot problems. If you have foot problems, report any cuts, sores, or bruises to your health care provider immediately. Contact a health care provider if:  You have a medical condition that increases your risk of infection and you have any cuts, sores, or bruises on your feet.  You have an injury that is not healing.  You have redness on your legs or feet.  You feel burning or tingling in your legs or feet.  You have pain or cramps in your legs and feet.  Your legs or feet are numb.  Your feet always feel cold.  You have pain around a toenail. Get help right away if:  You have a wound, scrape, corn, or callus on your foot and: ? You have pain, swelling, or redness that gets worse. ? You have fluid or blood coming from the wound, scrape, corn, or callus. ? Your wound, scrape, corn, or callus feels warm to the touch. ? You have pus or a bad smell coming from the wound, scrape, corn, or callus. ? You have a fever. ? You have a red line going up your leg. Summary  Check your feet every day for cuts, sores, red spots, swelling, and blisters.  Moisturize feet and legs daily.  Wear shoes that fit properly and have enough cushioning.  If you have foot problems, report any cuts, sores, or bruises to your health care provider immediately.  Schedule a complete foot exam at least once a year (annually) or more  often if you have foot problems. This information is not intended to replace advice given to you by your health care provider. Make sure you discuss any questions you have with your health care provider. Document Revised: 08/05/2019 Document Reviewed: 12/14/2016 Elsevier Patient Education  2020 Elsevier Inc.   

## 2020-05-02 ENCOUNTER — Telehealth: Payer: Self-pay | Admitting: Critical Care Medicine

## 2020-05-02 ENCOUNTER — Encounter: Payer: Self-pay | Admitting: Critical Care Medicine

## 2020-05-02 ENCOUNTER — Other Ambulatory Visit: Payer: Self-pay

## 2020-05-02 ENCOUNTER — Ambulatory Visit: Payer: BC Managed Care – PPO

## 2020-05-02 DIAGNOSIS — E1169 Type 2 diabetes mellitus with other specified complication: Secondary | ICD-10-CM

## 2020-05-02 DIAGNOSIS — I10 Essential (primary) hypertension: Secondary | ICD-10-CM

## 2020-05-02 DIAGNOSIS — Z794 Long term (current) use of insulin: Secondary | ICD-10-CM

## 2020-05-02 MED ORDER — METFORMIN HCL 500 MG PO TABS: 1000.0000 mg | ORAL_TABLET | Freq: Two times a day (BID) | ORAL | 2 refills | Status: DC

## 2020-05-02 MED ORDER — FREESTYLE LIBRE 2 SENSOR MISC: 6 refills | Status: DC

## 2020-05-02 MED ORDER — OZEMPIC (1 MG/DOSE) 2 MG/1.5ML ~~LOC~~ SOPN: 1.0000 mg | PEN_INJECTOR | SUBCUTANEOUS | 4 refills | Status: DC

## 2020-05-02 MED ORDER — ATORVASTATIN CALCIUM 40 MG PO TABS: 40.0000 mg | ORAL_TABLET | Freq: Every day | ORAL | 2 refills | Status: DC

## 2020-05-02 MED ORDER — HYDROCHLOROTHIAZIDE 12.5 MG PO CAPS: 12.5000 mg | ORAL_CAPSULE | Freq: Every day | ORAL | 2 refills | Status: DC

## 2020-05-02 NOTE — Telephone Encounter (Signed)
1) Medication(s) Requested (by name): ozempic & metformin & atorvastatin & hydrochlorothiazide & contiinuous free flow SENSOR.  2) Pharmacy of Choice: [facility] [address]  3) Special Requests:   Approved medications will be sent to the pharmacy, we will reach out if there is an issue.  Requests made after 3pm may not be addressed until the following business day!  If a patient is unsure of the name of the medication(s) please note and ask patient to call back when they are able to provide all info, do not send to responsible party until all information is available!  Marland Kitchen

## 2020-05-02 NOTE — Telephone Encounter (Signed)
Called pt made aware of refills/ unable to come tomorrow afternoon / booked for morning

## 2020-05-02 NOTE — Telephone Encounter (Signed)
Refills on requested meds sent to our pharmacy  pls schedule a work in visit tomorrow: after lunch ok

## 2020-05-03 ENCOUNTER — Encounter: Payer: Self-pay | Admitting: Critical Care Medicine

## 2020-05-03 ENCOUNTER — Ambulatory Visit: Payer: BC Managed Care – PPO | Attending: Critical Care Medicine | Admitting: Critical Care Medicine

## 2020-05-03 VITALS — BP 147/107 | HR 75 | Temp 97.5°F | Resp 16 | Wt 244.4 lb

## 2020-05-03 DIAGNOSIS — E1165 Type 2 diabetes mellitus with hyperglycemia: Secondary | ICD-10-CM | POA: Diagnosis not present

## 2020-05-03 DIAGNOSIS — Z794 Long term (current) use of insulin: Secondary | ICD-10-CM | POA: Diagnosis not present

## 2020-05-03 DIAGNOSIS — I10 Essential (primary) hypertension: Secondary | ICD-10-CM | POA: Diagnosis not present

## 2020-05-03 DIAGNOSIS — E1169 Type 2 diabetes mellitus with other specified complication: Secondary | ICD-10-CM

## 2020-05-03 DIAGNOSIS — E781 Pure hyperglyceridemia: Secondary | ICD-10-CM

## 2020-05-03 DIAGNOSIS — E785 Hyperlipidemia, unspecified: Secondary | ICD-10-CM

## 2020-05-03 DIAGNOSIS — K029 Dental caries, unspecified: Secondary | ICD-10-CM | POA: Insufficient documentation

## 2020-05-03 LAB — GLUCOSE, POCT (MANUAL RESULT ENTRY): POC Glucose: 182 mg/dl — AB (ref 70–99)

## 2020-05-03 MED ORDER — NOVOLOG FLEXPEN 100 UNIT/ML ~~LOC~~ SOPN: 11.0000 [IU] | PEN_INJECTOR | Freq: Three times a day (TID) | SUBCUTANEOUS | 1 refills | Status: DC

## 2020-05-03 MED ORDER — LOSARTAN POTASSIUM 100 MG PO TABS: 100.0000 mg | ORAL_TABLET | Freq: Every day | ORAL | 1 refills | Status: DC

## 2020-05-03 MED ORDER — HYDROCHLOROTHIAZIDE 25 MG PO TABS
25.0000 mg | ORAL_TABLET | Freq: Every day | ORAL | 3 refills | Status: DC
Start: 2020-05-03 — End: 2020-12-26

## 2020-05-03 MED ORDER — AMLODIPINE BESYLATE 10 MG PO TABS: 10.0000 mg | ORAL_TABLET | Freq: Every day | ORAL | 2 refills | Status: DC

## 2020-05-03 MED ORDER — BASAGLAR KWIKPEN 100 UNIT/ML ~~LOC~~ SOPN: 20.0000 [IU] | PEN_INJECTOR | Freq: Every day | SUBCUTANEOUS | 2 refills | Status: DC

## 2020-05-03 NOTE — Assessment & Plan Note (Signed)
Hypertension under variable control of asked the patient to obtain a blood pressure monitor and check his blood pressure at home in the meantime we will increase hydrochlorthiazide to 25 mg daily maintain losartan and amlodipine at current dose levels

## 2020-05-03 NOTE — Assessment & Plan Note (Signed)
Severe dental caries involving the molars of the right and left lower jaw  Have made a referral to dental care for extraction

## 2020-05-03 NOTE — Patient Instructions (Signed)
Increase hydrochlorthiazide to one daily  No other medication changes  Please obtain an eye exam  Please see a dentist for bad teeth  Return Dr Delford Field 3 months

## 2020-05-03 NOTE — Assessment & Plan Note (Signed)
Type 2 diabetes with slow improvement in control who recommended he use Glucerna for snacks in particular if he is going to skip dinner to have a Glucerna shake instead and maintain his 3 times daily NovoLog dosing along with his Ozempic weekly and Lantus as prescribed

## 2020-05-03 NOTE — Progress Notes (Signed)
Subjective:    Patient ID: Anthony Hubbard, male    DOB: 1985-12-04, 34 y.o.   MRN: [id number]  34 y.o.M with type 2 diabetes and hypertension and recent admission for left lower extremity cellulitis and abscess with need for drainage per orthopedics  This is a follow-up visit for hypertension type 2 diabetes and recent admission for left lower extremity cellulitis and abscess with drainage.  The patient's lower extremity is improving and wound care has been ongoing per orthopedics.  Recent notes from orthopedics suggest the wound is slowly healing.  The patient still on Keflex for antibiotic coverage of gram-positive cocci from the cultures.  The patient maintains the Lantus and also now on Trulicity.  Blood sugars have been ranging in the lower area of 1 20-1 40 at home.  The patient is due tetanus vaccine and flu shot.  Patient also needs a foot exam as well as microalbumin check in the urine.  Note the patient has still some pain particular with dressing changes on the wound.  11/17/2019 This is a follow-up visit from a September 2020 visit.  The patient is being treated for type 2 diabetes and hyperlipidemia.  Patient also had cellulitis and abscess of the left leg.  The cellulitis and abscess have resolved and the wound is healed.  The patient is now off antibiotics.  The patient is being followed for diabetes and hypertension.  Noted home he states his blood pressures in the 140/80 range.  His blood sugars have been in the 1 20-1 40 range fasting and then get up to mid 200s postprandial  He has not been compliant with his diet.  Note today's hemoglobin A1c is 11 and his CBG is 260  The patient tends to eat a lot in different times and does not take snacks that are healthy in for him between meals but instead eats unhealthy snacks  01/12/2020 The patient returns today for follow-up and continues to run high sugars although not as high as previous visits.  His fastings were in the 200  range postprandial 300.  Previously he was fasting in the 450 range.  He is not been able to access his NovoLog due to insurance barriers.  He is on the Lantus at 55 units daily and is on Metformin at 1000 mg twice daily as well now is on Ozempic 0.5 weekly  Blood pressure showing some improved control  We have been giving the patient counseling with regards to his diet   There are no other new complaints   03/29/2020 This patient is seen as a work in visit as he had gone to the ER recently with hyperglycemia again.  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  Initially was working the first shift going in at 4:30 in the morning getting off at 2:30 in the afternoon in the situation he would skip his breakfast.  At other times he is shifted over to an afternoon shift going into work at 2:30 PM getting off at 10:30 PM in that circumstance he found himself skipping dinner or going out and having fast food.  He states normally his blood sugars are 120 fasting and 180 after eating more recently they have been increasing since eating more fast food and skipping meals  He is to be on Lantus 15 units daily Ozempic 1 injection weekly and NovoLog 11 units 3 times a day with  meals but he has been skipping meals  05/03/2020 Patient seen today in return follow-up blood sugars have been improved he typically runs in the 160s in the middle of the day preprandial 180s postprandial sometimes overnight he will run in the 160s as well.  Very few blood glucoses are over 200 these days compared to prior.  He did receive his first Covid vaccine which was a Estate manager/land agent vaccine recently.  He has no new complaints at this time.  He is working at [employer] in [employer] and works from 3-11  His dietary habits have improved however he occasionally skips dinner The patient's diabetes protocol is as below however he occasionally  skips the evening dose of NovoLog when he does not eat dinner Increase lantus to 20units daily Increase ozempic to '1mg'$  weekly Cont tid novolog 11 units with meals Blood pressure has been trending up and today is 147/107 on arrival he is on the amlodipine 10 mg a day losartan 100 mg a day and HCTZ 12.5 daily  The patient does struggle with poor dentition and has carious teeth in the molar distribution in his right lower jaw that need removal  Past Medical History:  Diagnosis Date  . Diabetes mellitus without complication ([place])    type 2   . Hypertension   . Hypertensive urgency 06/07/2019  . Phimosis   . Superficial thrombosis of left lower extremity 06/24/2019     Family History  Problem Relation Age of Onset  . Diabetes Father   . Diabetes Paternal Uncle      Social History   Socioeconomic History  . Marital status: Married    Spouse name: Not on file  . Number of children: Not on file  . Years of education: Not on file  . Highest education level: Not on file  Occupational History  . Not on file  Tobacco Use  . Smoking status: Never Smoker  . Smokeless tobacco: Never Used  Substance and Sexual Activity  . Alcohol use: No  . Drug use: Not Currently    Types: Marijuana  . Sexual activity: Not on file  Other Topics Concern  . Not on file  Social History Narrative  . Not on file   Social Determinants of Health   Financial Resource Strain:   . Difficulty of Paying Living Expenses:   Food Insecurity:   . Worried About Charity fundraiser in the Last Year:   . Arboriculturist in the Last Year:   Transportation Needs:   . Film/video editor (Medical):   Marland Kitchen Lack of Transportation (Non-Medical):   Physical Activity:   . Days of Exercise per Week:   . Minutes of Exercise per Session:   Stress:   . Feeling of Stress :   Social Connections:   . Frequency of Communication with Friends and Family:   . Frequency of Social Gatherings with Friends and Family:   .  Attends Religious Services:   . Active Member of Clubs or Organizations:   . Attends Archivist Meetings:   Marland Kitchen Marital Status:   Intimate Partner Violence:   . Fear of Current or Ex-Partner:   . Emotionally Abused:   Marland Kitchen Physically Abused:   . Sexually Abused:      No Known Allergies   Outpatient Medications Prior to Visit  Medication Sig Dispense Refill  . atorvastatin (LIPITOR) 40 MG tablet Take 1 tablet (40 mg total) by mouth daily. 30 tablet 2  . blood  glucose meter kit and supplies KIT Dispense based on patient and insurance preference. Use up to four times daily as directed. (FOR ICD-9 250.00, 250.01). 1 each 0  . Continuous Blood Gluc Receiver (FREESTYLE LIBRE 2 READER) DEVI Use to monitor blood sugar 1 each 6  . Continuous Blood Gluc Sensor (FREESTYLE LIBRE 2 SENSOR) MISC Use to monitor blood sugar 1 each 6  . ibuprofen (ADVIL) 600 MG tablet Take 1 tablet (600 mg total) by mouth every 8 (eight) hours as needed. 60 tablet 1  . metFORMIN (GLUCOPHAGE) 500 MG tablet Take 2 tablets (1,000 mg total) by mouth 2 (two) times daily. 120 tablet 2  . polyethylene glycol (MIRALAX / GLYCOLAX) 17 g packet Take 17 g by mouth daily as needed for mild constipation. 14 each 0  . Semaglutide, 1 MG/DOSE, (OZEMPIC, 1 MG/DOSE,) 2 MG/1.5ML SOPN Inject 0.75 mLs (1 mg total) into the skin once a week. 4 pen 4  . TRUEPLUS 5-BEVEL PEN NEEDLES 31G X 5 MM MISC USE AS DIRECTED FOR LANTUS 100 each 0  . amLODipine (NORVASC) 10 MG tablet Take 1 tablet (10 mg total) by mouth daily. 90 tablet 2  . hydrochlorothiazide (MICROZIDE) 12.5 MG capsule Take 1 capsule (12.5 mg total) by mouth daily. 30 capsule 2  . insulin aspart (NOVOLOG FLEXPEN) 100 UNIT/ML FlexPen Inject 11 Units into the skin 3 (three) times daily with meals. 30 mL 1  . Insulin Glargine (BASAGLAR KWIKPEN) 100 UNIT/ML Inject 0.2 mLs (20 Units total) into the skin daily. 15 mL 2  . losartan (COZAAR) 100 MG tablet Take 1 tablet (100 mg total) by  mouth daily. 90 tablet 1   No facility-administered medications prior to visit.    Review of Systems Constitutional:   No  weight loss, night sweats,  Fevers, chills, fatigue, lassitude. HEENT:   headaches,  Difficulty swallowing,  Tooth/dental problems,  Sore throat,                No sneezing, itching, ear ache, nasal congestion, post nasal drip,   CV:  No chest pain,  Orthopnea, PND, swelling in lower extremities, anasarca, dizziness, palpitations  GI  No heartburn, indigestion, abdominal pain, nausea, vomiting, diarrhea, change in bowel habits, loss of appetite  Resp: No shortness of breath with exertion or at rest.  No excess mucus, no productive cough,  No non-productive cough,  No coughing up of blood.  No change in color of mucus.  No wheezing.  No chest wall deformity  Skin: no rash or lesions.  GU: no dysuria, change in color of urine, no urgency or frequency.  No flank pain.  MS:  No joint pain or swelling.  No decreased range of motion.  No back pain.  Psych:  No change in mood or affect. No depression or anxiety.  No memory loss.     Objective:   Physical ExamBP (!) 147/107   Pulse 75   Temp (!) 97.5 F (36.4 C)   Resp 16   Wt 244 lb 6.4 oz (110.9 kg)   SpO2 98%   BMI 35.07 kg/m   Gen: Pleasant, well-nourished, in no distress,  normal affect  ENT: Poor dentition with numerous carious teeth in the molar distribution right lower and left lower jaw mouth clear,  oropharynx clear, no postnasal drip  Neck: No JVD, no TMG, no carotid bruits  Lungs: No use of accessory muscles, no dullness to percussion, clear without rales or rhonchi  Cardiovascular: RRR, heart sounds normal, no  murmur or gallops, no peripheral edema  Abdomen: soft and NT, no HSM,  BS normal  Musculoskeletal: slight swelling left lateral calf no evidence infection or DVT Neuro: alert, non focal  Skin: Warm, no lesions or rashes   CBC Latest Ref Rng & Units 03/22/2020 01/19/2020 06/24/2019    WBC 4.0 - 10.5 K/uL 8.2 8.4 9.4  Hemoglobin 13.0 - 17.0 g/dL 17.1(H) 17.6(H) 16.0  Hematocrit 39.0 - 52.0 % 50.2 50.9 47.9  Platelets 150 - 400 K/uL 192 191 286   BMP Latest Ref Rng & Units 03/22/2020 01/19/2020 11/17/2019  Glucose 70 - 99 mg/dL 434(H) 384(H) 315(H)  BUN 6 - 20 mg/dL '15 15 12  '$ Creatinine 0.61 - 1.24 mg/dL 1.00 1.18 1.06  BUN/Creat Ratio 9 - 20 - - 11  Sodium 135 - 145 mmol/L 133(L) 135 135  Potassium 3.5 - 5.1 mmol/L 4.1 4.2 4.4  Chloride 98 - 111 mmol/L 97(L) 100 98  CO2 22 - 32 mmol/L '24 23 26  '$ Calcium 8.9 - 10.3 mg/dL 9.2 9.1 9.4  Lipid Panel     Component Value Date/Time   CHOL 169 06/24/2019 1052   TRIG 223 (H) 06/24/2019 1052   HDL 35 (L) 06/24/2019 1052   CHOLHDL 4.8 06/24/2019 1052   LDLCALC 89 06/24/2019 1052   Lab Results  Component Value Date   HGBA1C 8.8 (H) 03/18/2020   cbg 182     Assessment & Plan:  I personally reviewed all images and lab data in the [facility] system as well as any outside material available during this office visit and agree with the  radiology impressions.   Essential hypertension Hypertension under variable control of asked the patient to obtain a blood pressure monitor and check his blood pressure at home in the meantime we will increase hydrochlorthiazide to 25 mg daily maintain losartan and amlodipine at current dose levels  Hyperlipidemia associated with type 2 diabetes mellitus ([place]) Hyperlipidemia secondary to diabetes we will continue atorvastatin 40 mg daily  Type 2 diabetes mellitus with hyperglycemia, with long-term current use of insulin ([facility]) Type 2 diabetes with slow improvement in control who recommended he use Glucerna for snacks in particular if he is going to skip dinner to have a Glucerna shake instead and maintain his 3 times daily NovoLog dosing along with his Ozempic weekly and Lantus as prescribed  Dental caries Severe dental caries involving the molars of the right and left lower jaw  Have made a  referral to dental care for extraction   Anthony Hubbard was seen today for diabetes and hypertension.  Diagnoses and all orders for this visit:  Type 2 diabetes mellitus with hyperglycemia, with long-term current use of insulin ([facility]) -     POCT glucose (manual entry) -     insulin aspart (NOVOLOG FLEXPEN) 100 UNIT/ML FlexPen; Inject 11 Units into the skin 3 (three) times daily with meals. -     Insulin Glargine (BASAGLAR KWIKPEN) 100 UNIT/ML; Inject 0.2 mLs (20 Units total) into the skin daily.  Essential hypertension -     amLODipine (NORVASC) 10 MG tablet; Take 1 tablet (10 mg total) by mouth daily. -     losartan (COZAAR) 100 MG tablet; Take 1 tablet (100 mg total) by mouth daily.  Hyperlipidemia associated with type 2 diabetes mellitus ([facility])  Hypertriglyceridemia  Dental caries  Other orders -     hydrochlorothiazide (HYDRODIURIL) 25 MG tablet; Take 1 tablet (25 mg total) by mouth daily.   The patient was given information  on how to go to urgent tooth for dental extraction and as well the need for an eye exam

## 2020-05-03 NOTE — Assessment & Plan Note (Signed)
Hyperlipidemia secondary to diabetes we will continue atorvastatin 40 mg daily

## 2020-05-04 ENCOUNTER — Ambulatory Visit: Payer: BC Managed Care – PPO | Admitting: Pharmacist

## 2020-05-04 ENCOUNTER — Telehealth: Payer: Self-pay | Admitting: Critical Care Medicine

## 2020-05-04 ENCOUNTER — Other Ambulatory Visit: Payer: Self-pay

## 2020-05-04 ENCOUNTER — Encounter: Payer: BC Managed Care – PPO | Attending: Critical Care Medicine | Admitting: Skilled Nursing Facility1

## 2020-05-04 ENCOUNTER — Encounter: Payer: Self-pay | Admitting: Critical Care Medicine

## 2020-05-04 ENCOUNTER — Encounter: Payer: Self-pay | Admitting: Skilled Nursing Facility1

## 2020-05-04 DIAGNOSIS — E538 Deficiency of other specified B group vitamins: Secondary | ICD-10-CM | POA: Diagnosis not present

## 2020-05-04 DIAGNOSIS — Z794 Long term (current) use of insulin: Secondary | ICD-10-CM | POA: Insufficient documentation

## 2020-05-04 DIAGNOSIS — E119 Type 2 diabetes mellitus without complications: Secondary | ICD-10-CM | POA: Insufficient documentation

## 2020-05-04 DIAGNOSIS — Z79899 Other long term (current) drug therapy: Secondary | ICD-10-CM | POA: Diagnosis not present

## 2020-05-04 NOTE — Progress Notes (Signed)
Diabetes Self-Management Education  Visit Type: First/Initial  05/04/2020  [name], identified by name and date of birth, is a 34 y.o. male with a diagnosis of Diabetes: Type 2.   ASSESSMENT  Height 5\' 8"  (1.727 m), weight 243 lb (110.2 kg). Body mass index is 36.95 kg/m.  Pt states he has had diabetes about 4-5 years.  Pt states he does not get hungry until 1: fasting numbers: 180; 2 hours post prandial: 130. Pt states his work does not have AC so when he had his low was very hot and experienced a low of 60.  Pt states he has an infection in his mouth.  Pt states he has not had much of a hunger since he had sepsis.  Pt states he has pretty physical job and works 10 hours a day 6 days a week: working 1:30pm to midnight. Pt states he sleeps about 6 hours every night.   Pt states Since 5 years ago he had a lot of hair loss (patchy significant hair loss). Pt states he feels his tongue is swollen and has poor taste Pt states he loses his balance a lot, blurry vision (with high blood sugars). Drained and weak feeling. 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  Pt denies drinking alcohol and smoking.  Diabetes Self-Management Education - 05/04/20 0835      Visit Information   Visit Type  First/Initial      Initial Visit   Diabetes Type  Type 2    Are you currently following a meal plan?  No    Are you taking your medications as prescribed?  Yes      Health Coping   How would you rate your overall health?  Fair      Psychosocial Assessment   Patient Belief/Attitude about Diabetes  Motivated to manage diabetes    Self-care barriers  None    Self-management support  Friends    Patient Concerns  Nutrition/Meal planning    Special Needs  None      Pre-Education Assessment   Patient understands the diabetes disease and treatment process.  Needs Instruction    Patient understands incorporating nutritional management into lifestyle.   Needs Instruction    Patient undertands incorporating physical activity into lifestyle.  Needs Instruction    Patient understands using medications safely.  Needs Instruction    Patient understands monitoring blood glucose, interpreting and using results  Needs Instruction    Patient understands prevention, detection, and treatment of acute complications.  Needs Instruction    Patient understands prevention, detection, and treatment of chronic complications.  Needs Instruction    Patient understands how to develop strategies to address psychosocial issues.  Needs Instruction    Patient understands how to develop strategies to promote health/change behavior.  Needs Instruction      Complications   Last HgB A1C per patient/outside source  8.8 %    How often do you check your blood sugar?  > 4 times/day    Fasting Blood glucose range (mg/dL)  180-200    Postprandial Blood glucose range (mg/dL)  130-179    Number of hypoglycemic episodes per month  1    Can you tell when your blood sugar is low?  Yes    What do you do if your blood sugar is low?  eat    Number of hyperglycemic episodes per week  0    Have you had a dilated eye exam  in the past 12 months?  No    Have you had a dental exam in the past 12 months?  Yes    Are you checking your feet?  Yes    How many days per week are you checking your feet?  7      Dietary Intake   Breakfast  coffee + splenda + sugar free creamer    Lunch  chicken + brown rice    Snack (afternoon)  coffee + mexican sweet bread    Beverage(s)  coffee, water, hibicus tea      Exercise   Exercise Type  Light (walking / raking leaves)    How many days per week to you exercise?  6    How many minutes per day do you exercise?  60    Total minutes per week of exercise  360      Patient Education   Previous Diabetes Education  No    Disease state   Factors that contribute to the development of diabetes;Definition of diabetes, type 1 and 2, and the diagnosis of  diabetes    Acute complications  Taught treatment of hypoglycemia - the 15 rule.;Discussed and identified patients' treatment of hyperglycemia.    Personal strategies to promote health  Lifestyle issues that need to be addressed for better diabetes care      Individualized Goals (developed by patient)   Nutrition  Follow meal plan discussed;General guidelines for healthy choices and portions discussed    Physical Activity  Exercise 5-7 days per week;60 minutes per day      Post-Education Assessment   Patient understands the diabetes disease and treatment process.  Demonstrates understanding / competency    Patient understands incorporating nutritional management into lifestyle.  Demonstrates understanding / competency    Patient undertands incorporating physical activity into lifestyle.  Demonstrates understanding / competency    Patient understands using medications safely.  Demonstrates understanding / competency    Patient understands monitoring blood glucose, interpreting and using results  Demonstrates understanding / competency    Patient understands prevention, detection, and treatment of acute complications.  Demonstrates understanding / competency    Patient understands prevention, detection, and treatment of chronic complications.  Demonstrates understanding / competency    Patient understands how to develop strategies to address psychosocial issues.  Demonstrates understanding / competency    Patient understands how to develop strategies to promote health/change behavior.  Demonstrates understanding / competency      Outcomes   Expected Outcomes  Demonstrated interest in learning. Expect positive outcomes    Future DMSE  4-6 wks    Program Status  Completed       Individualized Plan for Diabetes Self-Management Training:   Learning Objective:  Patient will have a greater understanding of diabetes self-management. Patient education plan is to attend individual and/or group  sessions per assessed needs and concerns.   Plan:   Goals: Aim to eat every 3-5 hours If feeling sick/shaky check your blood sugar Check your feet every day looking for anything that was not there the day before Create balanced meals Be sure to bring 2 meals and 1-2 snacks while at work For snack: any fruit will do Not eating is not an option Take a multivitamin with iron   Expected Outcomes:  Demonstrated interest in learning. Expect positive outcomes  Education material provided: ADA - How to Thrive: A Guide for Your Journey with Diabetes, My Plate and Snack sheet  If problems or questions, patient  to contact team via:  Phone  Future DSME appointment: 4-6 wks

## 2020-05-04 NOTE — Telephone Encounter (Signed)
-----  Message from Ruby Cola, RD sent at 05/04/2020 10:02 AM EDT ----- Good Morning,  I hope this message finds you well.  I met with Anthony Hubbard today and he is exhibiting some possible signs of vitamin B12 deficiency. Complaints: swollen tongue with loss of taste, loss of balance, severe hair loss (no family hx), in addition to a very poor diet. Would you be able to order a B12 lab?   Thank you, Sandie Ano MS, RD

## 2020-05-04 NOTE — Telephone Encounter (Signed)
Called pt made aware of pending lab work

## 2020-05-04 NOTE — Telephone Encounter (Signed)
Pt needs B12 level lab

## 2020-05-05 LAB — CBC WITH DIFFERENTIAL/PLATELET
Absolute Monocytes: 602 cells/uL (ref 200–950)
Basophils Absolute: 56 cells/uL (ref 0–200)
Basophils Relative: 0.6 %
Eosinophils Absolute: 179 cells/uL (ref 15–500)
Eosinophils Relative: 1.9 %
HCT: 48.8 % (ref 38.5–50.0)
Hemoglobin: 16.9 g/dL (ref 13.2–17.1)
Lymphs Abs: 3328 cells/uL (ref 850–3900)
MCH: 29.4 pg (ref 27.0–33.0)
MCHC: 34.6 g/dL (ref 32.0–36.0)
MCV: 84.9 fL (ref 80.0–100.0)
MPV: 11.3 fL (ref 7.5–12.5)
Monocytes Relative: 6.4 %
Neutro Abs: 5236 cells/uL (ref 1500–7800)
Neutrophils Relative %: 55.7 %
Platelets: 208 10*3/uL (ref 140–400)
RBC: 5.75 10*6/uL (ref 4.20–5.80)
RDW: 12.8 % (ref 11.0–15.0)
Total Lymphocyte: 35.4 %
WBC: 9.4 10*3/uL (ref 3.8–10.8)

## 2020-05-05 LAB — HEPATIC FUNCTION PANEL
AG Ratio: 1.8 (calc) (ref 1.0–2.5)
ALT: 24 U/L (ref 9–46)
AST: 15 U/L (ref 10–40)
Albumin: 4.4 g/dL (ref 3.6–5.1)
Alkaline phosphatase (APISO): 75 U/L (ref 36–130)
Bilirubin, Direct: 0.1 mg/dL (ref 0.0–0.2)
Globulin: 2.4 g/dL (calc) (ref 1.9–3.7)
Indirect Bilirubin: 0.3 mg/dL (calc) (ref 0.2–1.2)
Total Bilirubin: 0.4 mg/dL (ref 0.2–1.2)
Total Protein: 6.8 g/dL (ref 6.1–8.1)

## 2020-05-05 LAB — VITAMIN B12: Vitamin B-12: 486 pg/mL (ref 232–1245)

## 2020-05-06 ENCOUNTER — Other Ambulatory Visit: Payer: Self-pay | Admitting: Podiatry

## 2020-05-06 MED ORDER — TERBINAFINE HCL 250 MG PO TABS: 250.0000 mg | ORAL_TABLET | Freq: Every day | ORAL | 0 refills | Status: DC

## 2020-05-09 ENCOUNTER — Telehealth: Payer: Self-pay | Admitting: *Deleted

## 2020-05-09 DIAGNOSIS — Z79899 Other long term (current) drug therapy: Secondary | ICD-10-CM

## 2020-05-09 NOTE — Telephone Encounter (Signed)
I informed pt of Dr. Gabriel Rung review of results and orders, and I mailed a copy of the orders to pt as a reminder.

## 2020-05-09 NOTE — Telephone Encounter (Signed)
-----   Message from Vivi Barrack, DPM sent at 05/06/2020 12:51 PM EDT ----- Milton Ferguson- please let him know that the blood work is normal and can start Lamisil. I have sent it to the pharmacy for him. I would like to see him back or at least have the blood work rechecked in 4-6 weeks. It would be a CBC and LFT. Thanks.

## 2020-05-31 ENCOUNTER — Telehealth: Payer: Self-pay

## 2020-05-31 NOTE — Telephone Encounter (Signed)
Excellent will note in chart

## 2020-05-31 NOTE — Telephone Encounter (Signed)
Copied from CRM [id number]. Topic: General - Inquiry >> May 25, 2020  1:03 PM Leafy Ro wrote: Reason for CRM: pt is calling to let dr Delford Field know he had dm  eye exam at [facility] and he does need glasses. Pt is waiting for his glasses to come back

## 2020-06-02 ENCOUNTER — Telehealth: Payer: Self-pay | Admitting: Critical Care Medicine

## 2020-06-02 ENCOUNTER — Ambulatory Visit: Payer: BC Managed Care – PPO | Admitting: Skilled Nursing Facility1

## 2020-06-02 NOTE — Telephone Encounter (Signed)
Called pt unable to reach/ Left voice message to call back. Name and phone nr provided. 

## 2020-06-02 NOTE — Telephone Encounter (Addendum)
Pt calling to find out status of his accommodations for work intermittent FMLA paperwork sent to his employer asap. 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 Pt states he got VM confirming the office received his FMLA paperwork, and he is looking to get that sent back to them.   [employer]

## 2020-06-03 NOTE — Telephone Encounter (Signed)
I recall doing his paper work. I do not remember which staff member had the paperwork

## 2020-06-07 ENCOUNTER — Ambulatory Visit: Payer: BC Managed Care – PPO | Admitting: Podiatry

## 2020-06-13 NOTE — Telephone Encounter (Signed)
Paper provided to Dr Delford Field / Will f /u when signed

## 2020-06-14 ENCOUNTER — Telehealth: Payer: Self-pay

## 2020-06-14 ENCOUNTER — Ambulatory Visit: Payer: BC Managed Care – PPO | Admitting: Skilled Nursing Facility1

## 2020-06-14 NOTE — Telephone Encounter (Signed)
Called made aware of signed form by PCP/ stated will come today and pick it up. Made front desk staff aware.

## 2020-06-15 NOTE — Telephone Encounter (Signed)
Patient requesting this faxed to   Attn: Mikle Bosworth  Fax#[phone]

## 2020-06-15 NOTE — Telephone Encounter (Signed)
Called pt left VM / paperwork at the he front desk for pt to pick up

## 2020-06-16 ENCOUNTER — Ambulatory Visit: Payer: BC Managed Care – PPO | Admitting: Pharmacist

## 2020-06-21 ENCOUNTER — Ambulatory Visit: Payer: BC Managed Care – PPO | Admitting: Critical Care Medicine

## 2020-06-22 ENCOUNTER — Telehealth: Payer: Self-pay | Admitting: Critical Care Medicine

## 2020-06-22 NOTE — Telephone Encounter (Signed)
Please advise.  Copied from CRM [id number]. Topic: General - Other >> Jun 15, 2020  2:22 PM Dalphine Handing A wrote: Patient stated that he still isnt receiving faxes and would like the documents uploaded to my chart

## 2020-06-23 NOTE — Telephone Encounter (Signed)
Called pt left message to come and pick up signed paperwork from front desk.

## 2020-06-30 ENCOUNTER — Ambulatory Visit: Payer: Self-pay

## 2020-06-30 ENCOUNTER — Ambulatory Visit: Payer: BC Managed Care – PPO | Admitting: Critical Care Medicine

## 2020-06-30 ENCOUNTER — Other Ambulatory Visit: Payer: Self-pay

## 2020-06-30 VITALS — BP 153/99 | HR 100 | Resp 18 | Ht 69.0 in

## 2020-06-30 DIAGNOSIS — E1165 Type 2 diabetes mellitus with hyperglycemia: Secondary | ICD-10-CM

## 2020-06-30 DIAGNOSIS — I10 Essential (primary) hypertension: Secondary | ICD-10-CM

## 2020-06-30 DIAGNOSIS — Z794 Long term (current) use of insulin: Secondary | ICD-10-CM | POA: Diagnosis not present

## 2020-06-30 DIAGNOSIS — E1169 Type 2 diabetes mellitus with other specified complication: Secondary | ICD-10-CM | POA: Diagnosis not present

## 2020-06-30 DIAGNOSIS — E785 Hyperlipidemia, unspecified: Secondary | ICD-10-CM

## 2020-06-30 LAB — GLUCOSE, POCT (MANUAL RESULT ENTRY)
POC Glucose: 410 mg/dl — AB (ref 70–99)
POC Glucose: 417 mg/dl — AB (ref 70–99)

## 2020-06-30 MED ORDER — INSULIN ASPART 100 UNIT/ML ~~LOC~~ SOLN
10.0000 [IU] | Freq: Once | SUBCUTANEOUS | Status: AC
  Administered 2020-06-30: 10 [IU] via SUBCUTANEOUS

## 2020-06-30 MED ORDER — NOVOLOG FLEXPEN 100 UNIT/ML ~~LOC~~ SOPN: 14.0000 [IU] | PEN_INJECTOR | Freq: Three times a day (TID) | SUBCUTANEOUS | 1 refills | Status: DC

## 2020-06-30 MED ORDER — METOPROLOL SUCCINATE ER 50 MG PO TB24
50.0000 mg | ORAL_TABLET | Freq: Every day | ORAL | 3 refills | Status: DC
Start: 2020-06-30 — End: 2020-12-26

## 2020-06-30 MED ORDER — BASAGLAR KWIKPEN 100 UNIT/ML ~~LOC~~ SOPN: 30.0000 [IU] | PEN_INJECTOR | Freq: Every day | SUBCUTANEOUS | 2 refills | Status: DC

## 2020-06-30 NOTE — Patient Instructions (Addendum)
10 units of novolog was given  Increase lantus to 30 units at night  Increase novolog to 14 units three times a day with meal  Start metoprolol 50mg  daily for blood pressure  Stay on amlodipine, hydrochlorthiazide, losartan  Please work on meal planning and eliminate smoothies and peanut butter in your diet   See diet below  See Dr next Thursday in clinic   Dr Tuesday will work on your paper work    Diabetes Mellitus and Exercise Exercising regularly is important for your overall health, especially when you have diabetes (diabetes mellitus). Exercising is not only about losing weight. It has many other health benefits, such as increasing muscle strength and bone density and reducing body fat and stress. This leads to improved fitness, flexibility, and endurance, all of which result in better overall health. Exercise has additional benefits for people with diabetes, including:  Reducing appetite.  Helping to lower and control blood glucose.  Lowering blood pressure.  Helping to control amounts of fatty substances (lipids) in the blood, such as cholesterol and triglycerides.  Helping the body to respond better to insulin (improving insulin sensitivity).  Reducing how much insulin the body needs.  Decreasing the risk for heart disease by: ? Lowering cholesterol and triglyceride levels. ? Increasing the levels of good cholesterol. ? Lowering blood glucose levels. What is my activity plan? Your health care provider or certified diabetes educator can help you make a plan for the type and frequency of exercise (activity plan) that works for you. Make sure that you:  Do at least 150 minutes of moderate-intensity or vigorous-intensity exercise each week. This could be brisk walking, biking, or water aerobics. ? Do stretching and strength exercises, such as yoga or weightlifting, at least 2 times a week. ? Spread out your activity over at least 3 days of the week.  Get some  form of physical activity every day. ? Do not go more than 2 days in a row without some kind of physical activity. ? Avoid being inactive for more than 30 minutes at a time. Take frequent breaks to walk or stretch.  Choose a type of exercise or activity that you enjoy, and set realistic goals.  Start slowly, and gradually increase the intensity of your exercise over time. What do I need to know about managing my diabetes?   Check your blood glucose before and after exercising. ? If your blood glucose is 240 mg/dL ([place] mmol/L) or higher before you exercise, check your urine for ketones. If you have ketones in your urine, do not exercise until your blood glucose returns to normal. ? If your blood glucose is 100 mg/dL (5.6 mmol/L) or lower, eat a snack containing 15-20 grams of carbohydrate. Check your blood glucose 15 minutes after the snack to make sure that your level is above 100 mg/dL (5.6 mmol/L) before you start your exercise.  Know the symptoms of low blood glucose (hypoglycemia) and how to treat it. Your risk for hypoglycemia increases during and after exercise. Common symptoms of hypoglycemia can include: ? Hunger. ? Anxiety. ? Sweating and feeling clammy. ? Confusion. ? Dizziness or feeling light-headed. ? Increased heart rate or palpitations. ? Blurry vision. ? Tingling or numbness around the mouth, lips, or tongue. ? Tremors or shakes. ? Irritability.  Keep a rapid-acting carbohydrate snack available before, during, and after exercise to help prevent or treat hypoglycemia.  Avoid injecting insulin into areas of the body that are going to be exercised. For example,  avoid injecting insulin into: ? The arms, when playing tennis. ? The legs, when jogging.  Keep records of your exercise habits. Doing this can help you and your health care provider adjust your diabetes management plan as needed. Write down: ? Food that you eat before and after you exercise. ? Blood glucose  levels before and after you exercise. ? The type and amount of exercise you have done. ? When your insulin is expected to peak, if you use insulin. Avoid exercising at times when your insulin is peaking.  When you start a new exercise or activity, work with your health care provider to make sure the activity is safe for you, and to adjust your insulin, medicines, or food intake as needed.  Drink plenty of water while you exercise to prevent dehydration or heat stroke. Drink enough fluid to keep your urine clear or pale yellow. Summary  Exercising regularly is important for your overall health, especially when you have diabetes (diabetes mellitus).  Exercising has many health benefits, such as increasing muscle strength and bone density and reducing body fat and stress.  Your health care provider or certified diabetes educator can help you make a plan for the type and frequency of exercise (activity plan) that works for you.  When you start a new exercise or activity, work with your health care provider to make sure the activity is safe for you, and to adjust your insulin, medicines, or food intake as needed. This information is not intended to replace advice given to you by your health care provider. Make sure you discuss any questions you have with your health care provider. Document Revised: 06/06/2017 Document Reviewed: 04/23/2016 Elsevier Patient Education  2020 [employer]  Diabetes Mellitus and Nutrition, Adult When you have diabetes (diabetes mellitus), it is very important to have healthy eating habits because your blood sugar (glucose) levels are greatly affected by what you eat and drink. Eating healthy foods in the appropriate amounts, at about the same times every day, can help you:  Control your blood glucose.  Lower your risk of heart disease.  Improve your blood pressure.  Reach or maintain a healthy weight. Every person with diabetes is different, and each person has  different needs for a meal plan. Your health care provider may recommend that you work with a diet and nutrition specialist (dietitian) to make a meal plan that is best for you. Your meal plan may vary depending on factors such as:  The calories you need.  The medicines you take.  Your weight.  Your blood glucose, blood pressure, and cholesterol levels.  Your activity level.  Other health conditions you have, such as heart or kidney disease. How do carbohydrates affect me? Carbohydrates, also called carbs, affect your blood glucose level more than any other type of food. Eating carbs naturally raises the amount of glucose in your blood. Carb counting is a method for keeping track of how many carbs you eat. Counting carbs is important to keep your blood glucose at a healthy level, especially if you use insulin or take certain oral diabetes medicines. It is important to know how many carbs you can safely have in each meal. This is different for every person. Your dietitian can help you calculate how many carbs you should have at each meal and for each snack. Foods that contain carbs include:  Bread, cereal, rice, pasta, and crackers.  Potatoes and corn.  Peas, beans, and lentils.  Milk and yogurt.  Fruit and  juice.  Desserts, such as cakes, cookies, ice cream, and candy. How does alcohol affect me? Alcohol can cause a sudden decrease in blood glucose (hypoglycemia), especially if you use insulin or take certain oral diabetes medicines. Hypoglycemia can be a life-threatening condition. Symptoms of hypoglycemia (sleepiness, dizziness, and confusion) are similar to symptoms of having too much alcohol. If your health care provider says that alcohol is safe for you, follow these guidelines:  Limit alcohol intake to no more than 1 drink per day for nonpregnant women and 2 drinks per day for men. One drink equals 12 oz of beer, 5 oz of wine, or 1 oz of hard liquor.  Do not drink on an  empty stomach.  Keep yourself hydrated with water, diet soda, or unsweetened iced tea.  Keep in mind that regular soda, juice, and other mixers may contain a lot of sugar and must be counted as carbs. What are tips for following this plan?  Reading food labels  Start by checking the serving size on the "Nutrition Facts" label of packaged foods and drinks. The amount of calories, carbs, fats, and other nutrients listed on the label is based on one serving of the item. Many items contain more than one serving per package.  Check the total grams (g) of carbs in one serving. You can calculate the number of servings of carbs in one serving by dividing the total carbs by 15. For example, if a food has 30 g of total carbs, it would be equal to 2 servings of carbs.  Check the number of grams (g) of saturated and trans fats in one serving. Choose foods that have low or no amount of these fats.  Check the number of milligrams (mg) of salt (sodium) in one serving. Most people should limit total sodium intake to less than 2,300 mg per day.  Always check the nutrition information of foods labeled as "low-fat" or "nonfat". These foods may be higher in added sugar or refined carbs and should be avoided.  Talk to your dietitian to identify your daily goals for nutrients listed on the label. Shopping  Avoid buying canned, premade, or processed foods. These foods tend to be high in fat, sodium, and added sugar.  Shop around the outside edge of the grocery store. This includes fresh fruits and vegetables, bulk grains, fresh meats, and fresh dairy. Cooking  Use low-heat cooking methods, such as baking, instead of high-heat cooking methods like deep frying.  Cook using healthy oils, such as olive, canola, or sunflower oil.  Avoid cooking with butter, cream, or high-fat meats. Meal planning  Eat meals and snacks regularly, preferably at the same times every day. Avoid going long periods of time without  eating.  Eat foods high in fiber, such as fresh fruits, vegetables, beans, and whole grains. Talk to your dietitian about how many servings of carbs you can eat at each meal.  Eat 4-6 ounces (oz) of lean protein each day, such as lean meat, chicken, fish, eggs, or tofu. One oz of lean protein is equal to: ? 1 oz of meat, chicken, or fish. ? 1 egg. ?  cup of tofu.  Eat some foods each day that contain healthy fats, such as avocado, nuts, seeds, and fish. Lifestyle  Check your blood glucose regularly.  Exercise regularly as told by your health care provider. This may include: ? 150 minutes of moderate-intensity or vigorous-intensity exercise each week. This could be brisk walking, biking, or water aerobics. ? Stretching  and doing strength exercises, such as yoga or weightlifting, at least 2 times a week.  Take medicines as told by your health care provider.  Do not use any products that contain nicotine or tobacco, such as cigarettes and e-cigarettes. If you need help quitting, ask your health care provider.  Work with a Veterinary surgeon or diabetes educator to identify strategies to manage stress and any emotional and social challenges. Questions to ask a health care provider  Do I need to meet with a diabetes educator?  Do I need to meet with a dietitian?  What number can I call if I have questions?  When are the best times to check my blood glucose? Where to find more information:  American Diabetes Association: diabetes.org  Academy of Nutrition and Dietetics: www.eatright.[employer] of Diabetes and Digestive and Kidney Diseases (NIH): [employer].tn Summary  A healthy meal plan will help you control your blood glucose and maintain a healthy lifestyle.  Working with a diet and nutrition specialist (dietitian) can help you make a meal plan that is best for you.  Keep in mind that carbohydrates (carbs) and alcohol have immediate effects on your blood glucose  levels. It is important to count carbs and to use alcohol carefully. This information is not intended to replace advice given to you by your health care provider. Make sure you discuss any questions you have with your health care provider. Document Revised: 10/25/2017 Document Reviewed: 12/17/2016 Elsevier Patient Education  2020 [employer].

## 2020-06-30 NOTE — Telephone Encounter (Signed)
Pt. Reports he was at work yesterday, lifting items and fainted. Reports he "was out only a few seconds." States he fainted "one time about a year ago." No other symptoms. No availability with PCP today. Pt. Asking if he can see [name] today. No answer on FC line. Please advise pt. Would like to be seen today.   Reason for Disposition  [1] MODERATE dizziness (e.g., interferes with normal activities) AND [2] has NOT been evaluated by physician for this  (Exception: dizziness caused by heat exposure, sudden standing, or poor fluid intake)  Answer Assessment - Initial Assessment Questions 1. DESCRIPTION: "Describe your dizziness."     Dizzy 2. LIGHTHEADED: "Do you feel lightheaded?" (e.g., somewhat faint, woozy, weak upon standing)     Woozy 3. VERTIGO: "Do you feel like either you or the room is spinning or tilting?" (i.e. vertigo)     No 4. SEVERITY: "How bad is it?"  "Do you feel like you are going to faint?" "Can you stand and walk?"   - MILD: Feels slightly dizzy, but walking normally.   - MODERATE: Feels very unsteady when walking, but not falling; interferes with normal activities (e.g., school, work) .   - SEVERE: Unable to walk without falling, or requires assistance to walk without falling; feels like passing out now.      Moderate 5. ONSET:  "When did the dizziness begin?"     1 month ago 6. AGGRAVATING FACTORS: "Does anything make it worse?" (e.g., standing, change in head position)     Standing 7. HEART RATE: "Can you tell me your heart rate?" "How many beats in 15 seconds?"  (Note: not all patients can do this)       No 8. CAUSE: "What do you think is causing the dizziness?"     Unsure 9. RECURRENT SYMPTOM: "Have you had dizziness before?" If Yes, ask: "When was the last time?" "What happened that time?"     Yes 10. OTHER SYMPTOMS: "Do you have any other symptoms?" (e.g., fever, chest pain, vomiting, diarrhea, bleeding)       No 11. PREGNANCY: "Is there any chance you are  pregnant?" "When was your last menstrual period?"       n/a  Protocols used: DIZZINESS [facility]

## 2020-06-30 NOTE — Progress Notes (Signed)
Subjective:    Patient ID: Anthony Hubbard, male    DOB: February 15, 1986, 34 y.o.   MRN: [id number]  34 y.o.M with type 2 diabetes and hypertension and recent admission for left lower extremity cellulitis and abscess with need for drainage per orthopedics  This is a follow-up visit for hypertension type 2 diabetes and recent admission for left lower extremity cellulitis and abscess with drainage.  The patient's lower extremity is improving and wound care has been ongoing per orthopedics.  Recent notes from orthopedics suggest the wound is slowly healing.  The patient still on Keflex for antibiotic coverage of gram-positive cocci from the cultures.  The patient maintains the Lantus and also now on Trulicity.  Blood sugars have been ranging in the lower area of 1 20-1 40 at home.  The patient is due tetanus vaccine and flu shot.  Patient also needs a foot exam as well as microalbumin check in the urine.  Note the patient has still some pain particular with dressing changes on the wound.  11/17/2019 This is a follow-up visit from a September 2020 visit.  The patient is being treated for type 2 diabetes and hyperlipidemia.  Patient also had cellulitis and abscess of the left leg.  The cellulitis and abscess have resolved and the wound is healed.  The patient is now off antibiotics.  The patient is being followed for diabetes and hypertension.  Noted home he states his blood pressures in the 140/80 range.  His blood sugars have been in the 1 20-1 40 range fasting and then get up to mid 200s postprandial  He has not been compliant with his diet.  Note today's hemoglobin A1c is 11 and his CBG is 260  The patient tends to eat a lot in different times and does not take snacks that are healthy in for him between meals but instead eats unhealthy snacks  01/12/2020 The patient returns today for follow-up and continues to run high sugars although not as high as previous visits.  His fastings were in the 200  range postprandial 300.  Previously he was fasting in the 450 range.  He is not been able to access his NovoLog due to insurance barriers.  He is on the Lantus at 55 units daily and is on Metformin at 1000 mg twice daily as well now is on Ozempic 0.5 weekly  Blood pressure showing some improved control  We have been giving the patient counseling with regards to his diet   There are no other new complaints   03/29/2020 This patient is seen as a work in visit as he had gone to the ER recently with hyperglycemia again.  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  Initially was working the first shift going in at 4:30 in the morning getting off at 2:30 in the afternoon in the situation he would skip his breakfast.  At other times he is shifted over to an afternoon shift going into work at 2:30 PM getting off at 10:30 PM in that circumstance he found himself skipping dinner or going out and having fast food.  He states normally his blood sugars are 120 fasting and 180 after eating more recently they have been increasing since eating more fast food and skipping meals  He is to be on Lantus 15 units daily Ozempic 1 injection weekly and NovoLog 11 units 3 times a day with  meals but he has been skipping meals  05/03/2020 Patient seen today in return follow-up blood sugars have been improved he typically runs in the 160s in the middle of the day preprandial 180s postprandial sometimes overnight he will run in the 160s as well.  Very few blood glucoses are over 200 these days compared to prior.  He did receive his first Covid vaccine which was a Estate manager/land agent vaccine recently.  He has no new complaints at this time.  He is working at [employer] in [employer] and works from 3-11  His dietary habits have improved however he occasionally skips dinner The patient's diabetes protocol is as below however he occasionally  skips the evening dose of NovoLog when he does not eat dinner Increase lantus to 20units daily Increase ozempic to 24m weekly Cont tid novolog 11 units with meals Blood pressure has been trending up and today is 147/107 on arrival he is on the amlodipine 10 mg a day losartan 100 mg a day and HCTZ 12.5 daily  The patient does struggle with poor dentition and has carious teeth in the molar distribution in his right lower jaw that need removal  06/30/2020 This patient is seen today in the mobile health clinic as a walk-in.  He works in a wPhysiological scientistand extreme temperatures.  He had an episode of syncope yesterday.  His blood sugar has been running in 3-400 range recently.  When I ask him what type of diet he has these been eating a lot of peanut butter sandwiches and smoothies with high sugar content.  He has also been skipping lunch.  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 The patient states he has been compliant with his insulin but has not been taking the midday insulin dose. On arrival the patient's blood sugar was 417 after  10 units of regular NovoLog it was down to 410.  The patient states he has been trying to keep up with drinking plenty of water while on the job.   Note on arrival also his blood pressure is 153/99 The patient brings with him a job restriction accommodation form to be filled out and he was asked by his employer to take a week off  to improve his health status.  Past Medical History:  Diagnosis Date   Diabetes mellitus without complication ([place]    type 2    Hypertension    Hypertensive urgency 06/07/2019   Phimosis    Superficial thrombosis of left lower extremity 06/24/2019     Family History  Problem Relation Age of Onset   Diabetes Father    Diabetes Paternal Uncle      Social History    Socioeconomic History   Marital status: Married    Spouse name: Not on file   Number of children: Not on file   Years of education: Not on file   Highest education level: Not on file  Occupational History   Not on file  Tobacco Use   Smoking status: Never Smoker   Smokeless tobacco: Never Used  Vaping Use   Vaping Use: Never used  Substance and Sexual Activity   Alcohol use: No   Drug use: Not Currently    Types: Marijuana   Sexual activity: Not on file  Other Topics Concern  Not on file  Social History Narrative   Not on file   Social Determinants of Health   Financial Resource Strain:    Difficulty of Paying Living Expenses:   Food Insecurity:    Worried About Charity fundraiser in the Last Year:    Arboriculturist in the Last Year:   Transportation Needs:    Film/video editor (Medical):    Lack of Transportation (Non-Medical):   Physical Activity:    Days of Exercise per Week:    Minutes of Exercise per Session:   Stress:    Feeling of Stress :   Social Connections:    Frequency of Communication with Friends and Family:    Frequency of Social Gatherings with Friends and Family:    Attends Religious Services:    Active Member of Clubs or Organizations:    Attends Music therapist:    Marital Status:   Intimate Partner Violence:    Fear of Current or Ex-Partner:    Emotionally Abused:    Physically Abused:    Sexually Abused:      No Known Allergies   Outpatient Medications Prior to Visit  Medication Sig Dispense Refill   amLODipine (NORVASC) 10 MG tablet Take 1 tablet (10 mg total) by mouth daily. 90 tablet 2   atorvastatin (LIPITOR) 40 MG tablet Take 1 tablet (40 mg total) by mouth daily. 30 tablet 2   blood glucose meter kit and supplies KIT Dispense based on patient and insurance preference. Use up to four times daily as directed. (FOR ICD-9 250.00, 250.01). 1 each 0   Continuous Blood Gluc  Receiver (FREESTYLE LIBRE 2 READER) DEVI Use to monitor blood sugar 1 each 6   Continuous Blood Gluc Sensor (FREESTYLE LIBRE 2 SENSOR) MISC Use to monitor blood sugar 1 each 6   hydrochlorothiazide (HYDRODIURIL) 25 MG tablet Take 1 tablet (25 mg total) by mouth daily. 90 tablet 3   ibuprofen (ADVIL) 600 MG tablet Take 1 tablet (600 mg total) by mouth every 8 (eight) hours as needed. 60 tablet 1   losartan (COZAAR) 100 MG tablet Take 1 tablet (100 mg total) by mouth daily. 90 tablet 1   metFORMIN (GLUCOPHAGE) 500 MG tablet Take 2 tablets (1,000 mg total) by mouth 2 (two) times daily. 120 tablet 2   polyethylene glycol (MIRALAX / GLYCOLAX) 17 g packet Take 17 g by mouth daily as needed for mild constipation. 14 each 0   Semaglutide, 1 MG/DOSE, (OZEMPIC, 1 MG/DOSE,) 2 MG/1.5ML SOPN Inject 0.75 mLs (1 mg total) into the skin once a week. 4 pen 4   terbinafine (LAMISIL) 250 MG tablet Take 1 tablet (250 mg total) by mouth daily. 90 tablet 0   TRUEPLUS 5-BEVEL PEN NEEDLES 31G X 5 MM MISC USE AS DIRECTED FOR LANTUS 100 each 0   insulin aspart (NOVOLOG FLEXPEN) 100 UNIT/ML FlexPen Inject 11 Units into the skin 3 (three) times daily with meals. 30 mL 1   Insulin Glargine (BASAGLAR KWIKPEN) 100 UNIT/ML Inject 0.2 mLs (20 Units total) into the skin daily. 15 mL 2   No facility-administered medications prior to visit.    Review of Systems  Constitutional:   No  weight loss, night sweats,  Fevers, chills, fatigue, lassitude. HEENT:   headaches,  Difficulty swallowing,  Tooth/dental problems,  Sore throat,                No sneezing, itching, ear ache, nasal congestion, post nasal drip,  CV:  No chest pain,  Orthopnea, PND, swelling in lower extremities, anasarca, dizziness, palpitations  GI  No heartburn, indigestion, abdominal pain, nausea, vomiting, diarrhea, change in bowel habits, loss of appetite  Resp: No shortness of breath with exertion or at rest.  No excess mucus, no productive  cough,  No non-productive cough,  No coughing up of blood.  No change in color of mucus.  No wheezing.  No chest wall deformity  Skin: no rash or lesions.  GU: no dysuria, change in color of urine, no urgency or frequency.  No flank pain.  MS:  No joint pain or swelling.  No decreased range of motion.  No back pain.  Psych:  No change in mood or affect. No depression or anxiety.  No memory loss.     Objective:   Physical Exam BP (!) 153/99 (BP Location: Left Arm, Patient Position: Sitting, Cuff Size: Normal)    Pulse 100    Resp 18    Ht '5\' 9"'  (1.753 m)    SpO2 100%    BMI 35.88 kg/m   Gen: Pleasant, well-nourished, in no distress,  normal affect  ENT: Poor dentition with numerous carious teeth in the molar distribution right lower and left lower jaw mouth clear,  oropharynx clear, no postnasal drip  Neck: No JVD, no TMG, no carotid bruits  Lungs: No use of accessory muscles, no dullness to percussion, clear without rales or rhonchi  Cardiovascular: RRR, heart sounds normal, no murmur or gallops, no peripheral edema  Abdomen: soft and NT, no HSM,  BS normal  Musculoskeletal: slight swelling left lateral calf no evidence infection or DVT Neuro: alert, non focal  Skin: Warm, no lesions or rashes   CBC Latest Ref Rng & Units 05/04/2020 03/22/2020 01/19/2020  WBC 3.8 - 10.8 Thousand/uL 9.4 8.2 8.4  Hemoglobin 13.2 - 17.1 g/dL 16.9 17.1(H) 17.6(H)  Hematocrit 38 - 50 % 48.8 50.2 50.9  Platelets 140 - 400 Thousand/uL 208 192 191   BMP Latest Ref Rng & Units 06/30/2020 03/22/2020 01/19/2020  Glucose 65 - 99 mg/dL 415(H) 434(H) 384(H)  BUN 6 - 20 mg/dL '12 15 15  ' Creatinine 0.76 - 1.27 mg/dL 0.90 1.00 1.18  BUN/Creat Ratio 9 - 20 13 - -  Sodium 134 - 144 mmol/L 137 133(L) 135  Potassium 3.5 - 5.2 mmol/L 4.3 4.1 4.2  Chloride 96 - 106 mmol/L 98 97(L) 100  CO2 20 - 29 mmol/L '22 24 23  ' Calcium 8.7 - 10.2 mg/dL 9.6 9.2 9.1  Lipid Panel     Component Value Date/Time   CHOL 169  06/24/2019 1052   TRIG 223 (H) 06/24/2019 1052   HDL 35 (L) 06/24/2019 1052   CHOLHDL 4.8 06/24/2019 1052   LDLCALC 89 06/24/2019 1052   Lab Results  Component Value Date   HGBA1C 8.8 (H) 03/18/2020   cbg 417    Assessment & Plan:  I personally reviewed all images and lab data in the [facility] system as well as any outside material available during this office visit and agree with the  radiology impressions.   Type 2 diabetes mellitus with hyperglycemia, with long-term current use of insulin (HCC) Type 2 diabetes with poor control secondary to dietary indiscretion and working in extremely hot environment and becoming dehydrated and not keeping up with good hydration to the course of the day and also with stressful work schedule  Plan is to increase Lantus to 30 units nightly increase NovoLog 14 units 3 times  daily and the patient's been asked during this time off to develop a proper meal plan that he was taught by the diet reticulocyte nutritionist.  We will bring this patient back in return follow-up in 1 week  I told the patient I would fill out his paperwork  Essential hypertension Hypertension under poor control we will add metoprolol 50 mg daily   Diagnoses and all orders for this visit:  Type 2 diabetes mellitus with hyperglycemia, with long-term current use of insulin (HCC) -     Glucose (CBG) -     insulin aspart (novoLOG) injection 10 Units -     Comprehensive metabolic panel -     insulin aspart (NOVOLOG FLEXPEN) 100 UNIT/ML FlexPen; Inject 14 Units into the skin 3 (three) times daily with meals. -     Insulin Glargine (BASAGLAR KWIKPEN) 100 UNIT/ML; Inject 0.3 mLs (30 Units total) into the skin daily. -     Glucose (CBG)  Essential hypertension  Hyperlipidemia associated with type 2 diabetes mellitus ([place])  Other orders -     metoprolol succinate (TOPROL-XL) 50 MG 24 hr tablet; Take 1 tablet (50 mg total) by mouth daily. Take with or immediately following a meal.

## 2020-07-01 ENCOUNTER — Other Ambulatory Visit: Payer: Self-pay | Admitting: Critical Care Medicine

## 2020-07-01 DIAGNOSIS — Z794 Long term (current) use of insulin: Secondary | ICD-10-CM

## 2020-07-01 LAB — COMPREHENSIVE METABOLIC PANEL
ALT: 25 IU/L (ref 0–44)
AST: 15 IU/L (ref 0–40)
Albumin/Globulin Ratio: 1.7 (ref 1.2–2.2)
Albumin: 4.6 g/dL (ref 4.0–5.0)
Alkaline Phosphatase: 102 IU/L (ref 48–121)
BUN/Creatinine Ratio: 13 (ref 9–20)
BUN: 12 mg/dL (ref 6–20)
Bilirubin Total: 0.3 mg/dL (ref 0.0–1.2)
CO2: 22 mmol/L (ref 20–29)
Calcium: 9.6 mg/dL (ref 8.7–10.2)
Chloride: 98 mmol/L (ref 96–106)
Creatinine, Ser: 0.9 mg/dL (ref 0.76–1.27)
GFR calc Af Amer: 131 mL/min/{1.73_m2} (ref >59)
GFR calc non Af Amer: 113 mL/min/{1.73_m2} (ref >59)
Globulin, Total: 2.7 g/dL (ref 1.5–4.5)
Glucose: 415 mg/dL — ABNORMAL HIGH (ref 65–99)
Potassium: 4.3 mmol/L (ref 3.5–5.2)
Sodium: 137 mmol/L (ref 134–144)
Total Protein: 7.3 g/dL (ref 6.0–8.5)

## 2020-07-01 NOTE — Assessment & Plan Note (Signed)
Type 2 diabetes with poor control secondary to dietary indiscretion and working in extremely hot environment and becoming dehydrated and not keeping up with good hydration to the course of the day and also with stressful work schedule  Plan is to increase Lantus to 30 units nightly increase NovoLog 14 units 3 times daily and the patient's been asked during this time off to develop a proper meal plan that he was taught by the diet reticulocyte nutritionist.  We will bring this patient back in return follow-up in 1 week  I told the patient I would fill out his paperwork

## 2020-07-01 NOTE — Telephone Encounter (Signed)
Pharmacy is request PA due to quantity prescribed- Rx sent back for review

## 2020-07-01 NOTE — Assessment & Plan Note (Signed)
Hypertension under poor control we will add metoprolol 50 mg daily

## 2020-07-04 ENCOUNTER — Emergency Department (HOSPITAL_COMMUNITY)
Admission: EM | Admit: 2020-07-04 | Discharge: 2020-07-04 | Disposition: A | Discharge status: Home / Self Care | Payer: BC Managed Care – PPO | Attending: Emergency Medicine | Admitting: Emergency Medicine

## 2020-07-04 ENCOUNTER — Ambulatory Visit: Payer: BC Managed Care – PPO | Attending: Critical Care Medicine | Admitting: Critical Care Medicine

## 2020-07-04 ENCOUNTER — Other Ambulatory Visit: Payer: Self-pay

## 2020-07-04 ENCOUNTER — Encounter: Payer: Self-pay | Admitting: Critical Care Medicine

## 2020-07-04 ENCOUNTER — Encounter (HOSPITAL_COMMUNITY): Payer: Self-pay

## 2020-07-04 ENCOUNTER — Emergency Department (HOSPITAL_COMMUNITY): Payer: BC Managed Care – PPO

## 2020-07-04 VITALS — BP 147/92 | HR 72 | Temp 98.0°F | Resp 16 | Ht 69.0 in | Wt 240.0 lb

## 2020-07-04 DIAGNOSIS — Z794 Long term (current) use of insulin: Secondary | ICD-10-CM

## 2020-07-04 DIAGNOSIS — H538 Other visual disturbances: Secondary | ICD-10-CM | POA: Insufficient documentation

## 2020-07-04 DIAGNOSIS — I10 Essential (primary) hypertension: Secondary | ICD-10-CM

## 2020-07-04 DIAGNOSIS — E869 Volume depletion, unspecified: Secondary | ICD-10-CM | POA: Diagnosis not present

## 2020-07-04 DIAGNOSIS — R739 Hyperglycemia, unspecified: Secondary | ICD-10-CM | POA: Diagnosis not present

## 2020-07-04 DIAGNOSIS — R0789 Other chest pain: Secondary | ICD-10-CM | POA: Insufficient documentation

## 2020-07-04 DIAGNOSIS — E1165 Type 2 diabetes mellitus with hyperglycemia: Secondary | ICD-10-CM | POA: Diagnosis not present

## 2020-07-04 DIAGNOSIS — R079 Chest pain, unspecified: Secondary | ICD-10-CM | POA: Diagnosis not present

## 2020-07-04 DIAGNOSIS — Z5321 Procedure and treatment not carried out due to patient leaving prior to being seen by health care provider: Secondary | ICD-10-CM | POA: Insufficient documentation

## 2020-07-04 LAB — CBC
HCT: 50 % (ref 39.0–52.0)
Hemoglobin: 17.4 g/dL — ABNORMAL HIGH (ref 13.0–17.0)
MCH: 29.2 pg (ref 26.0–34.0)
MCHC: 34.8 g/dL (ref 30.0–36.0)
MCV: 84 fL (ref 80.0–100.0)
Platelets: 210 10*3/uL (ref 150–400)
RBC: 5.95 MIL/uL — ABNORMAL HIGH (ref 4.22–5.81)
RDW: 12.5 % (ref 11.5–15.5)
WBC: 10.8 10*3/uL — ABNORMAL HIGH (ref 4.0–10.5)
nRBC: 0 % (ref 0.0–0.2)

## 2020-07-04 LAB — BASIC METABOLIC PANEL
Anion gap: 9 (ref 5–15)
BUN: 13 mg/dL (ref 6–20)
CO2: 25 mmol/L (ref 22–32)
Calcium: 9.1 mg/dL (ref 8.9–10.3)
Chloride: 96 mmol/L — ABNORMAL LOW (ref 98–111)
Creatinine, Ser: 1.1 mg/dL (ref 0.61–1.24)
GFR calc Af Amer: >60 mL/min (ref >60)
GFR calc non Af Amer: >60 mL/min (ref >60)
Glucose, Bld: 430 mg/dL — ABNORMAL HIGH (ref 70–99)
Potassium: 3.9 mmol/L (ref 3.5–5.1)
Sodium: 130 mmol/L — ABNORMAL LOW (ref 135–145)

## 2020-07-04 LAB — CBG MONITORING, ED: Glucose-Capillary: 414 mg/dL — ABNORMAL HIGH (ref 70–99)

## 2020-07-04 LAB — GLUCOSE, POCT (MANUAL RESULT ENTRY)
POC Glucose: 382 mg/dl — AB (ref 70–99)
POC Glucose: 332 mg/dl — AB (ref 70–99)

## 2020-07-04 LAB — TROPONIN I (HIGH SENSITIVITY): Troponin I (High Sensitivity): 5 ng/L (ref <18)

## 2020-07-04 MED ORDER — BASAGLAR KWIKPEN 100 UNIT/ML ~~LOC~~ SOPN: 40.0000 [IU] | PEN_INJECTOR | Freq: Every day | SUBCUTANEOUS | 0 refills | Status: DC

## 2020-07-04 MED ORDER — SODIUM CHLORIDE 0.9% FLUSH: 3.0000 mL | Freq: Once | INTRAVENOUS | Status: DC

## 2020-07-04 MED ORDER — NOVOLOG FLEXPEN 100 UNIT/ML ~~LOC~~ SOPN: 16.0000 [IU] | PEN_INJECTOR | Freq: Three times a day (TID) | SUBCUTANEOUS | 1 refills | Status: DC

## 2020-07-04 MED ORDER — INSULIN ASPART 100 UNIT/ML ~~LOC~~ SOLN
10.0000 [IU] | Freq: Once | SUBCUTANEOUS | Status: AC
  Administered 2020-07-04: 10 [IU] via SUBCUTANEOUS

## 2020-07-04 MED ORDER — SODIUM CHLORIDE 0.9 % IV BOLUS: 1000.0000 mL | Freq: Once | INTRAVENOUS | Status: DC

## 2020-07-04 NOTE — Progress Notes (Signed)
Pt left AMA from the ED  CBG before discharge 316/ MD AWARE

## 2020-07-04 NOTE — Patient Instructions (Addendum)
Continue your healthy diet  Increase Lantus to 40 units daily at bedtime  Increase novolog to 16 units three times a day at meal   No other medication changes  A liter of IV saline was given,   10 units of novolog was given   Return Dr Delford Field 3 weeks, the appointment for later this week was canceled.  Paper work was filled out and given to you for your job

## 2020-07-04 NOTE — Progress Notes (Signed)
Subjective:    Patient ID: Anthony Hubbard, male    DOB: 1985-12-04, 34 y.o.   MRN: [id number]  34 y.o.M with type 2 diabetes and hypertension and recent admission for left lower extremity cellulitis and abscess with need for drainage per orthopedics  This is a follow-up visit for hypertension type 2 diabetes and recent admission for left lower extremity cellulitis and abscess with drainage.  The patient's lower extremity is improving and wound care has been ongoing per orthopedics.  Recent notes from orthopedics suggest the wound is slowly healing.  The patient still on Keflex for antibiotic coverage of gram-positive cocci from the cultures.  The patient maintains the Lantus and also now on Trulicity.  Blood sugars have been ranging in the lower area of 1 20-1 40 at home.  The patient is due tetanus vaccine and flu shot.  Patient also needs a foot exam as well as microalbumin check in the urine.  Note the patient has still some pain particular with dressing changes on the wound.  11/17/2019 This is a follow-up visit from a September 2020 visit.  The patient is being treated for type 2 diabetes and hyperlipidemia.  Patient also had cellulitis and abscess of the left leg.  The cellulitis and abscess have resolved and the wound is healed.  The patient is now off antibiotics.  The patient is being followed for diabetes and hypertension.  Noted home he states his blood pressures in the 140/80 range.  His blood sugars have been in the 1 20-1 40 range fasting and then get up to mid 200s postprandial  He has not been compliant with his diet.  Note today's hemoglobin A1c is 11 and his CBG is 260  The patient tends to eat a lot in different times and does not take snacks that are healthy in for him between meals but instead eats unhealthy snacks  01/12/2020 The patient returns today for follow-up and continues to run high sugars although not as high as previous visits.  His fastings were in the 200  range postprandial 300.  Previously he was fasting in the 450 range.  He is not been able to access his NovoLog due to insurance barriers.  He is on the Lantus at 55 units daily and is on Metformin at 1000 mg twice daily as well now is on Ozempic 0.5 weekly  Blood pressure showing some improved control  We have been giving the patient counseling with regards to his diet   There are no other new complaints   03/29/2020 This patient is seen as a work in visit as he had gone to the ER recently with hyperglycemia again.  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  Initially was working the first shift going in at 4:30 in the morning getting off at 2:30 in the afternoon in the situation he would skip his breakfast.  At other times he is shifted over to an afternoon shift going into work at 2:30 PM getting off at 10:30 PM in that circumstance he found himself skipping dinner or going out and having fast food.  He states normally his blood sugars are 120 fasting and 180 after eating more recently they have been increasing since eating more fast food and skipping meals  He is to be on Lantus 15 units daily Ozempic 1 injection weekly and NovoLog 11 units 3 times a day with  meals but he has been skipping meals  05/03/2020 Patient seen today in return follow-up blood sugars have been improved he typically runs in the 160s in the middle of the day preprandial 180s postprandial sometimes overnight he will run in the 160s as well.  Very few blood glucoses are over 200 these days compared to prior.  He did receive his first Covid vaccine which was a Estate manager/land agent vaccine recently.  He has no new complaints at this time.  He is working at [employer] in [employer] and works from 3-11  His dietary habits have improved however he occasionally skips dinner The patient's diabetes protocol is as below however he occasionally  skips the evening dose of NovoLog when he does not eat dinner Increase lantus to 20units daily Increase ozempic to 38m weekly Cont tid novolog 11 units with meals Blood pressure has been trending up and today is 147/107 on arrival he is on the amlodipine 10 mg a day losartan 100 mg a day and HCTZ 12.5 daily  The patient does struggle with poor dentition and has carious teeth in the molar distribution in his right lower jaw that need removal  06/30/2020 This patient is seen today in the mobile health clinic as a walk-in.  He works in a wPhysiological scientistand extreme temperatures.  He had an episode of syncope yesterday.  His blood sugar has been running in 3-400 range recently.  When I ask him what type of diet he has these been eating a lot of peanut butter sandwiches and smoothies with high sugar content.  He has also been skipping lunch.  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 The patient states he has been compliant with his insulin but has not been taking the midday insulin dose. On arrival the patient's blood sugar was 417 after  10 units of regular NovoLog it was down to 410.  The patient states he has been trying to keep up with drinking plenty of water while on the job.   Note on arrival also his blood pressure is 153/99 The patient brings with him a job restriction accommodation form to be filled out and he was asked by his employer to take a week off  to improve his health status.  07/04/2020 This patient is seen as a work in when he went to the emergency room yesterday spent 9 hours and then left that being seen.  At that time lab data did show hyponatremia blood sugar of 417 significant volume depletion negative troponin negative EKG otherwise unremarkable lab data  He states this morning his blood sugars were in the upper 200s on  arrival today is 383.  His girlfriend states he is not been eating meals on a regular basis and drinking beer he states he is eating meals somehow he is not being transparent with our team on this information.    Past Medical History:  Diagnosis Date  . Diabetes mellitus without complication ([facility]    type 2   . Hypertension   . Hypertensive urgency 06/07/2019  . Phimosis   . Superficial thrombosis of left lower extremity 06/24/2019     Family History  Problem Relation Age of Onset  . Diabetes Father   . Diabetes Paternal Uncle      Social History  Socioeconomic History  . Marital status: Married    Spouse name: Not on file  . Number of children: Not on file  . Years of education: Not on file  . Highest education level: Not on file  Occupational History  . Not on file  Tobacco Use  . Smoking status: Never Smoker  . Smokeless tobacco: Never Used  Vaping Use  . Vaping Use: Never used  Substance and Sexual Activity  . Alcohol use: No  . Drug use: Not Currently    Types: Marijuana  . Sexual activity: Not on file  Other Topics Concern  . Not on file  Social History Narrative  . Not on file   Social Determinants of Health   Financial Resource Strain:   . Difficulty of Paying Living Expenses:   Food Insecurity:   . Worried About Charity fundraiser in the Last Year:   . Arboriculturist in the Last Year:   Transportation Needs:   . Film/video editor (Medical):   Marland Kitchen Lack of Transportation (Non-Medical):   Physical Activity:   . Days of Exercise per Week:   . Minutes of Exercise per Session:   Stress:   . Feeling of Stress :   Social Connections:   . Frequency of Communication with Friends and Family:   . Frequency of Social Gatherings with Friends and Family:   . Attends Religious Services:   . Active Member of Clubs or Organizations:   . Attends Archivist Meetings:   Marland Kitchen Marital Status:   Intimate Partner Violence:   . Fear of Current or  Ex-Partner:   . Emotionally Abused:   Marland Kitchen Physically Abused:   . Sexually Abused:      No Known Allergies   Outpatient Medications Prior to Visit  Medication Sig Dispense Refill  . amLODipine (NORVASC) 10 MG tablet Take 1 tablet (10 mg total) by mouth daily. 90 tablet 2  . atorvastatin (LIPITOR) 40 MG tablet Take 1 tablet (40 mg total) by mouth daily. 30 tablet 2  . blood glucose meter kit and supplies KIT Dispense based on patient and insurance preference. Use up to four times daily as directed. (FOR ICD-9 250.00, 250.01). 1 each 0  . Continuous Blood Gluc Receiver (FREESTYLE LIBRE 2 READER) DEVI Use to monitor blood sugar 1 each 6  . Continuous Blood Gluc Sensor (FREESTYLE LIBRE 2 SENSOR) MISC Use to monitor blood sugar 1 each 6  . hydrochlorothiazide (HYDRODIURIL) 25 MG tablet Take 1 tablet (25 mg total) by mouth daily. 90 tablet 3  . losartan (COZAAR) 100 MG tablet Take 1 tablet (100 mg total) by mouth daily. 90 tablet 1  . metFORMIN (GLUCOPHAGE) 500 MG tablet Take 2 tablets (1,000 mg total) by mouth 2 (two) times daily. 120 tablet 2  . metoprolol succinate (TOPROL-XL) 50 MG 24 hr tablet Take 1 tablet (50 mg total) by mouth daily. Take with or immediately following a meal. 90 tablet 3  . polyethylene glycol (MIRALAX / GLYCOLAX) 17 g packet Take 17 g by mouth daily as needed for mild constipation. 14 each 0  . Semaglutide, 1 MG/DOSE, (OZEMPIC, 1 MG/DOSE,) 2 MG/1.5ML SOPN Inject 0.75 mLs (1 mg total) into the skin once a week. 4 pen 4  . terbinafine (LAMISIL) 250 MG tablet Take 1 tablet (250 mg total) by mouth daily. 90 tablet 0  . TRUEPLUS 5-BEVEL PEN NEEDLES 31G X 5 MM MISC USE AS DIRECTED FOR LANTUS 100 each 0  .  insulin aspart (NOVOLOG FLEXPEN) 100 UNIT/ML FlexPen Inject 14 Units into the skin 3 (three) times daily with meals. 30 mL 1  . Insulin Glargine (BASAGLAR KWIKPEN) 100 UNIT/ML INJECT 0.3 MLS (30 UNITS TOTAL) INTO THE SKIN DAILY. 30 mL 0  . ibuprofen (ADVIL) 600 MG tablet Take 1  tablet (600 mg total) by mouth every 8 (eight) hours as needed. (Patient not taking: Reported on 07/04/2020) 60 tablet 1   No facility-administered medications prior to visit.    Review of Systems  Constitutional:   No  weight loss, night sweats,  Fevers, chills, fatigue, lassitude. HEENT:   headaches,  Difficulty swallowing,  Tooth/dental problems,  Sore throat,                No sneezing, itching, ear ache, nasal congestion, post nasal drip,   CV:  No chest pain,  Orthopnea, PND, swelling in lower extremities, anasarca, dizziness, palpitations  GI  No heartburn, indigestion, abdominal pain, nausea, vomiting, diarrhea, change in bowel habits, loss of appetite  Resp: No shortness of breath with exertion or at rest.  No excess mucus, no productive cough,  No non-productive cough,  No coughing up of blood.  No change in color of mucus.  No wheezing.  No chest wall deformity  Skin: no rash or lesions.  GU: no dysuria, change in color of urine, no urgency or frequency.  No flank pain.  MS:  No joint pain or swelling.  No decreased range of motion.  No back pain.  Psych:  No change in mood or affect. No depression or anxiety.  No memory loss.     Objective:   Physical Exam BP (!) 147/92   Pulse 72   Temp 98 F (36.7 C)   Resp 16   Ht _0  (1.753 m)   Wt 240 lb (108.9 kg)   SpO2 98%   BMI 35.44 kg/m   Gen: Pleasant, well-nourished, in no distress,  normal affect  ENT: Poor dentition with numerous carious teeth in the molar distribution right lower and left lower jaw mouth clear,  oropharynx clear, no postnasal drip  Neck: No JVD, no TMG, no carotid bruits  Lungs: No use of accessory muscles, no dullness to percussion, clear without rales or rhonchi  Cardiovascular: RRR, heart sounds normal, no murmur or gallops, no peripheral edema  Abdomen: soft and NT, no HSM,  BS normal  Musculoskeletal: slight swelling left lateral calf no evidence infection or DVT Neuro: alert, non  focal  Skin: Warm, no lesions or rashes   CBC Latest Ref Rng & Units 07/04/2020 05/04/2020 03/22/2020  WBC 4.0 - 10.5 K/uL 10.8(H) 9.4 8.2  Hemoglobin 13.0 - 17.0 g/dL 17.4(H) 16.9 17.1(H)  Hematocrit 39 - 52 % 50.0 48.8 50.2  Platelets 150 - 400 K/uL 210 208 192   BMP Latest Ref Rng & Units 07/04/2020 06/30/2020 03/22/2020  Glucose 70 - 99 mg/dL 430(H) 415(H) 434(H)  BUN 6 - 20 mg/dL _1 Creatinine 0.61 - 1.24 mg/dL 1.10 0.90 1.00  BUN/Creat Ratio 9 - 20 - 13 -  Sodium 135 - 145 mmol/L 130(L) 137 133(L)  Potassium 3.5 - 5.1 mmol/L 3.9 4.3 4.1  Chloride 98 - 111 mmol/L 96(L) 98 97(L)  CO2 22 - 32 mmol/L _2 Calcium 8.9 - 10.3 mg/dL 9.1 9.6 9.2  Lipid Panel     Component Value Date/Time   CHOL 169 06/24/2019 1052   TRIG 223 (H) 06/24/2019 1052  HDL 35 (L) 06/24/2019 1052   CHOLHDL 4.8 06/24/2019 1052   LDLCALC 89 06/24/2019 1052   Lab Results  Component Value Date   HGBA1C 8.8 (H) 03/18/2020   cbg 417    Assessment & Plan:  I personally reviewed all images and lab data in the [facility] system as well as any outside material available during this office visit and agree with the  radiology impressions.   Essential hypertension Hypertension poorly controlled suspect there is an element here of nonadherence  Type 2 diabetes mellitus with hyperglycemia, with long-term current use of insulin (HCC) Ongoing persistent hyperglycemia secondary to dietary nonadherence  We will increase Lantus to 40 units daily increase NovoLog to 16 units 3 times daily with meals  We did give the patient a liter of saline IV at this visit    Volume depletion Patient given 1 L of saline IV at this visit   Diagnoses and all orders for this visit:  Type 2 diabetes mellitus with hyperglycemia, with long-term current use of insulin (HCC) -     POCT glucose (manual entry) -     sodium chloride 0.9 % bolus 1,000 mL -     insulin aspart (novoLOG) injection 10 Units -     insulin aspart (NOVOLOG  FLEXPEN) 100 UNIT/ML FlexPen; Inject 16 Units into the skin 3 (three) times daily with meals. -     Insulin Glargine (BASAGLAR KWIKPEN) 100 UNIT/ML; Inject 0.4 mLs (40 Units total) into the skin daily. -     POCT glucose (manual entry) -     Ambulatory referral to Endocrinology  Volume depletion -     sodium chloride 0.9 % bolus 1,000 mL  Essential hypertension   We will send the patient back to dietitian for further education

## 2020-07-04 NOTE — Assessment & Plan Note (Signed)
Ongoing persistent hyperglycemia secondary to dietary nonadherence  We will increase Lantus to 40 units daily increase NovoLog to 16 units 3 times daily with meals  We did give the patient a liter of saline IV at this visit

## 2020-07-04 NOTE — Assessment & Plan Note (Signed)
Hypertension poorly controlled suspect there is an element here of nonadherence

## 2020-07-04 NOTE — Assessment & Plan Note (Signed)
Patient given 1 L of saline IV at this visit

## 2020-07-04 NOTE — ED Triage Notes (Addendum)
Pt reports worsening L chest pain x 3-4 weeks. States at first it was related to movement, but is now constant. Pt also presents with hyperglycemia. He states that his sugars usually run in the 200s and tonight it is 414. Also reports some blurred vision that he says happens with his hyperglycemia.

## 2020-07-07 ENCOUNTER — Ambulatory Visit: Payer: BC Managed Care – PPO | Admitting: Critical Care Medicine

## 2020-07-08 ENCOUNTER — Telehealth: Payer: Self-pay | Admitting: Critical Care Medicine

## 2020-07-08 NOTE — Telephone Encounter (Signed)
Patient stopped by the office today and requested for pcp to fill out his paperwork for short term disability. Patient stated that he did take off for the 6 weeks that his job recommended. Patient requested for a call once paperwork was ready for him to come and pick it up. Forms(2) were placed in pcp box.

## 2020-07-09 NOTE — Telephone Encounter (Signed)
noted 

## 2020-07-11 NOTE — Telephone Encounter (Signed)
Pt called and is requesting to have an update. Pt states that he is needing this asap.  Please advise.

## 2020-07-12 NOTE — Telephone Encounter (Signed)
Halle. I am out of the office today. It will be completed by eob tomorrow

## 2020-07-13 ENCOUNTER — Telehealth: Payer: Self-pay

## 2020-07-13 NOTE — Telephone Encounter (Signed)
Completed disability form faxed to [facility]

## 2020-07-13 NOTE — Telephone Encounter (Signed)
Paper work completed.

## 2020-07-13 NOTE — Telephone Encounter (Signed)
Patient called to say that he will be by to pick up the original forms for the Disability paper work that was completed and faxed

## 2020-07-25 ENCOUNTER — Telehealth: Payer: Self-pay | Admitting: Critical Care Medicine

## 2020-07-25 NOTE — Telephone Encounter (Signed)
This was faxed two weeks ago.  Robyne Peers assisted.  The original was given to the pt

## 2020-07-25 NOTE — Telephone Encounter (Signed)
Please advise.   Copied from CRM [id number]. Topic: General - Other >> Jul 25, 2020  2:24 PM Dalphine Handing A wrote: Patient is requesting a callback from Dr .Florene Route nurse as soon as possible today in regards to short term disability paperwork that his employer faxed to our office. Patient wants to confirm that it was received and sent back due to patient not being able to get his paycheck until they receive the information Please advise

## 2020-08-03 ENCOUNTER — Ambulatory Visit: Payer: BC Managed Care – PPO | Admitting: Critical Care Medicine

## 2020-08-04 ENCOUNTER — Ambulatory Visit: Payer: BC Managed Care – PPO | Admitting: Critical Care Medicine

## 2020-08-08 ENCOUNTER — Ambulatory Visit: Payer: BC Managed Care – PPO | Attending: Critical Care Medicine | Admitting: Critical Care Medicine

## 2020-08-08 ENCOUNTER — Other Ambulatory Visit: Payer: Self-pay

## 2020-08-08 ENCOUNTER — Encounter: Payer: Self-pay | Admitting: Critical Care Medicine

## 2020-08-08 DIAGNOSIS — E1165 Type 2 diabetes mellitus with hyperglycemia: Secondary | ICD-10-CM

## 2020-08-08 DIAGNOSIS — Z794 Long term (current) use of insulin: Secondary | ICD-10-CM

## 2020-08-08 DIAGNOSIS — E785 Hyperlipidemia, unspecified: Secondary | ICD-10-CM

## 2020-08-08 DIAGNOSIS — I10 Essential (primary) hypertension: Secondary | ICD-10-CM

## 2020-08-08 DIAGNOSIS — B351 Tinea unguium: Secondary | ICD-10-CM

## 2020-08-08 DIAGNOSIS — E1169 Type 2 diabetes mellitus with other specified complication: Secondary | ICD-10-CM

## 2020-08-08 MED ORDER — LOSARTAN POTASSIUM 100 MG PO TABS: 100.0000 mg | ORAL_TABLET | Freq: Every day | ORAL | 1 refills | Status: DC

## 2020-08-08 MED ORDER — TERBINAFINE HCL 250 MG PO TABS: 250.0000 mg | ORAL_TABLET | Freq: Every day | ORAL | 0 refills | Status: DC

## 2020-08-08 MED ORDER — TRUEPLUS 5-BEVEL PEN NEEDLES 31G X 5 MM MISC: 3 refills | Status: DC

## 2020-08-08 MED ORDER — ATORVASTATIN CALCIUM 40 MG PO TABS: 40.0000 mg | ORAL_TABLET | Freq: Every day | ORAL | 2 refills | Status: DC

## 2020-08-08 MED ORDER — BASAGLAR KWIKPEN 100 UNIT/ML ~~LOC~~ SOPN: 40.0000 [IU] | PEN_INJECTOR | Freq: Every day | SUBCUTANEOUS | 1 refills | Status: DC

## 2020-08-08 MED ORDER — NOVOLOG FLEXPEN 100 UNIT/ML ~~LOC~~ SOPN: 16.0000 [IU] | PEN_INJECTOR | Freq: Three times a day (TID) | SUBCUTANEOUS | 1 refills | Status: DC

## 2020-08-08 MED ORDER — METFORMIN HCL 500 MG PO TABS: 1000.0000 mg | ORAL_TABLET | Freq: Two times a day (BID) | ORAL | 2 refills | Status: DC

## 2020-08-08 MED ORDER — OZEMPIC (1 MG/DOSE) 2 MG/1.5ML ~~LOC~~ SOPN: 1.0000 mg | PEN_INJECTOR | SUBCUTANEOUS | 1 refills | Status: AC

## 2020-08-08 MED ORDER — AMLODIPINE BESYLATE 10 MG PO TABS: 10.0000 mg | ORAL_TABLET | Freq: Every day | ORAL | 2 refills | Status: DC

## 2020-08-08 NOTE — Progress Notes (Signed)
Needs refill on Ozempic and Lamisil (never picked up)

## 2020-08-08 NOTE — Assessment & Plan Note (Signed)
Plan is to refill the terbinafine

## 2020-08-08 NOTE — Assessment & Plan Note (Signed)
Difficult control type 2 diabetes on complex therapy  We will not make medication change at this time we will have the patient come in for a follow-up visit in the next 3 weeks face-to-face

## 2020-08-08 NOTE — Progress Notes (Addendum)
Subjective:    Patient ID: Anthony Hubbard, male    DOB: 09-Oct-1986, 34 y.o.   MRN: [id number] Virtual Visit via Telephone Note  I connected with Anthony Hubbard on 08/08/20 at  9:30 AM EDT by telephone and verified that I am speaking with the correct person using two identifiers.   Consent:  I discussed the limitations, risks, security and privacy concerns of performing an evaluation and management service by telephone and the availability of in person appointments. I also discussed with the patient that there may be a patient responsible charge related to this service. The patient expressed understanding and agreed to proceed.  Location of patient: Patient was at home  Location of provider: I was in my office  Persons participating in the televisit with the patient.   No one else on the call 06/24/19 34 y.o.M with type 2 diabetes and hypertension and recent admission for left lower extremity cellulitis and abscess with need for drainage per orthopedics  This is a follow-up visit for hypertension type 2 diabetes and recent admission for left lower extremity cellulitis and abscess with drainage.  The patient's lower extremity is improving and wound care has been ongoing per orthopedics.  Recent notes from orthopedics suggest the wound is slowly healing.  The patient still on Keflex for antibiotic coverage of gram-positive cocci from the cultures.  The patient maintains the Lantus and also now on Trulicity.  Blood sugars have been ranging in the lower area of 1 20-1 40 at home.  The patient is due tetanus vaccine and flu shot.  Patient also needs a foot exam as well as microalbumin check in the urine.  Note the patient has still some pain particular with dressing changes on the wound.  11/17/2019 This is a follow-up visit from a September 2020 visit.  The patient is being treated for type 2 diabetes and hyperlipidemia.  Patient also had cellulitis and abscess of the left leg.  The  cellulitis and abscess have resolved and the wound is healed.  The patient is now off antibiotics.  The patient is being followed for diabetes and hypertension.  Noted home he states his blood pressures in the 140/80 range.  His blood sugars have been in the 1 20-1 40 range fasting and then get up to mid 200s postprandial  He has not been compliant with his diet.  Note today's hemoglobin A1c is 11 and his CBG is 260  The patient tends to eat a lot in different times and does not take snacks that are healthy in for him between meals but instead eats unhealthy snacks  01/12/2020 The patient returns today for follow-up and continues to run high sugars although not as high as previous visits.  His fastings were in the 200 range postprandial 300.  Previously he was fasting in the 450 range.  He is not been able to access his NovoLog due to insurance barriers.  He is on the Lantus at 55 units daily and is on Metformin at 1000 mg twice daily as well now is on Ozempic 0.5 weekly  Blood pressure showing some improved control  We have been giving the patient counseling with regards to his diet   There are no other new complaints   03/29/2020 This patient is seen as a work in visit as he had gone to the ER recently with hyperglycemia again.  Upon further review of the patient's social circumstances he worked in a [employer] but transferred to the plan  to make higher pay.  Initially was working the first shift going in at 4:30 in the morning getting off at 2:30 in the afternoon in the situation he would skip his breakfast.  At other times he is shifted over to an afternoon shift going into work at 2:30 PM getting off at 10:30 PM in that circumstance he found himself skipping dinner or going out and having fast food.  He states normally his blood sugars are 120 fasting and 180 after eating more recently they have been increasing since eating more fast food and skipping meals  He is to be on Lantus 15  units daily Ozempic 1 injection weekly and NovoLog 11 units 3 times a day with meals but he has been skipping meals  05/03/2020 Patient seen today in return follow-up blood sugars have been improved he typically runs in the 160s in the middle of the day preprandial 180s postprandial sometimes overnight he will run in the 160s as well.  Very few blood glucoses are over 200 these days compared to prior.  He did receive his first Covid vaccine which was a Estate manager/land agent vaccine recently.  He has no new complaints at this time.  He is working at [employer] in [employer] and works from 3-11  His dietary habits have improved however he occasionally skips dinner The patient's diabetes protocol is as below however he occasionally skips the evening dose of NovoLog when he does not eat dinner Increase lantus to 20units daily Increase ozempic to 57m weekly Cont tid novolog 11 units with meals Blood pressure has been trending up and today is 147/107 on arrival he is on the amlodipine 10 mg a day losartan 100 mg a day and HCTZ 12.5 daily  The patient does struggle with poor dentition and has carious teeth in the molar distribution in his right lower jaw that need removal  06/30/2020 This patient is seen today in the mobile health clinic as a walk-in.  He works in a wPhysiological scientistand extreme temperatures.  He had an episode of syncope yesterday.  His blood sugar has been running in 3-400 range recently.  When I ask him what type of diet he has these been eating a lot of peanut butter sandwiches and smoothies with high sugar content.  He has also been skipping lunch.  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 The patient states he has been compliant with his insulin but has not been taking the midday insulin dose. On arrival the patient's blood sugar was 417  after  10 units of regular NovoLog it was down to 410.  The patient states he has been trying to keep up with drinking plenty of water while on the job.   Note on arrival also his blood pressure is 153/99 The patient brings with him a job restriction accommodation form to be filled out and he was asked by his employer to take a week off  to improve his health status.  07/04/2020 This patient is seen as a work in when he went to the emergency room yesterday spent 9 hours and then left that being seen.  At that time lab data did show hyponatremia blood sugar of 417 significant volume depletion negative troponin negative EKG otherwise unremarkable lab data  He states this  morning his blood sugars were in the upper 200s on arrival today is 383.  His girlfriend states he is not been eating meals on a regular basis and drinking beer he states he is eating meals somehow he is not being transparent with our team on this information.  08/08/2020 This is a telephone visit follow-up for this 34 year old male with severe type 2 diabetes poorly controlled.  The patient states his glycemic control is improved.  His highest sugars been 230 his average is been in the 140s as low as 79.  He maintains the insulin Basaglar daily along with Ozempic and Metformin and 3 times daily short acting insulin.  Note he has been out of his Ozempic since August 21 because he cannot afford the co-pay on the refill.  He does not measure his blood pressure at home.  He is maintaining his blood pressure medications.  His visit with endocrinology as scheduled in October.  The patient is trying to find a new job either in the same company he is with or in a new facility  He has short-term disability paperwork that needs to be addressed Patient has toenail fungus he says is better on the terbinafine  Past Medical History:  Diagnosis Date  . Diabetes mellitus without complication ([place])    type 2   . Hypertension   . Hypertensive  urgency 06/07/2019  . Phimosis   . Superficial thrombosis of left lower extremity 06/24/2019     Family History  Problem Relation Age of Onset  . Diabetes Father   . Diabetes Paternal Uncle      Social History   Socioeconomic History  . Marital status: Married    Spouse name: Not on file  . Number of children: Not on file  . Years of education: Not on file  . Highest education level: Not on file  Occupational History  . Not on file  Tobacco Use  . Smoking status: Never Smoker  . Smokeless tobacco: Never Used  Vaping Use  . Vaping Use: Never used  Substance and Sexual Activity  . Alcohol use: No  . Drug use: Not Currently    Types: Marijuana  . Sexual activity: Not on file  Other Topics Concern  . Not on file  Social History Narrative  . Not on file   Social Determinants of Health   Financial Resource Strain:   . Difficulty of Paying Living Expenses: Not on file  Food Insecurity:   . Worried About Charity fundraiser in the Last Year: Not on file  . Ran Out of Food in the Last Year: Not on file  Transportation Needs:   . Lack of Transportation (Medical): Not on file  . Lack of Transportation (Non-Medical): Not on file  Physical Activity:   . Days of Exercise per Week: Not on file  . Minutes of Exercise per Session: Not on file  Stress:   . Feeling of Stress : Not on file  Social Connections:   . Frequency of Communication with Friends and Family: Not on file  . Frequency of Social Gatherings with Friends and Family: Not on file  . Attends Religious Services: Not on file  . Active Member of Clubs or Organizations: Not on file  . Attends Archivist Meetings: Not on file  . Marital Status: Not on file  Intimate Partner Violence:   . Fear of Current or Ex-Partner: Not on file  . Emotionally Abused: Not on file  . Physically Abused:  Not on file  . Sexually Abused: Not on file     No Known Allergies   Outpatient Medications Prior to Visit   Medication Sig Dispense Refill  . blood glucose meter kit and supplies KIT Dispense based on patient and insurance preference. Use up to four times daily as directed. (FOR ICD-9 250.00, 250.01). 1 each 0  . Continuous Blood Gluc Receiver (FREESTYLE LIBRE 2 READER) DEVI Use to monitor blood sugar 1 each 6  . Continuous Blood Gluc Sensor (FREESTYLE LIBRE 2 SENSOR) MISC Use to monitor blood sugar 1 each 6  . hydrochlorothiazide (HYDRODIURIL) 25 MG tablet Take 1 tablet (25 mg total) by mouth daily. 90 tablet 3  . ibuprofen (ADVIL) 600 MG tablet Take 1 tablet (600 mg total) by mouth every 8 (eight) hours as needed. 60 tablet 1  . metoprolol succinate (TOPROL-XL) 50 MG 24 hr tablet Take 1 tablet (50 mg total) by mouth daily. Take with or immediately following a meal. 90 tablet 3  . amLODipine (NORVASC) 10 MG tablet Take 1 tablet (10 mg total) by mouth daily. 90 tablet 2  . atorvastatin (LIPITOR) 40 MG tablet Take 1 tablet (40 mg total) by mouth daily. 30 tablet 2  . insulin aspart (NOVOLOG FLEXPEN) 100 UNIT/ML FlexPen Inject 16 Units into the skin 3 (three) times daily with meals. 30 mL 1  . Insulin Glargine (BASAGLAR KWIKPEN) 100 UNIT/ML Inject 0.4 mLs (40 Units total) into the skin daily. 30 mL 0  . losartan (COZAAR) 100 MG tablet Take 1 tablet (100 mg total) by mouth daily. 90 tablet 1  . metFORMIN (GLUCOPHAGE) 500 MG tablet Take 2 tablets (1,000 mg total) by mouth 2 (two) times daily. 120 tablet 2  . terbinafine (LAMISIL) 250 MG tablet Take 1 tablet (250 mg total) by mouth daily. 90 tablet 0  . TRUEPLUS 5-BEVEL PEN NEEDLES 31G X 5 MM MISC USE AS DIRECTED FOR LANTUS 100 each 0  . polyethylene glycol (MIRALAX / GLYCOLAX) 17 g packet Take 17 g by mouth daily as needed for mild constipation. (Patient not taking: Reported on 08/08/2020) 14 each 0  . Semaglutide, 1 MG/DOSE, (OZEMPIC, 1 MG/DOSE,) 2 MG/1.5ML SOPN Inject 0.75 mLs (1 mg total) into the skin once a week. (Patient not taking: Reported on  08/08/2020) 4 pen 4  . sodium chloride 0.9 % bolus 1,000 mL      No facility-administered medications prior to visit.    Review of Systems Constitutional:   No  weight loss, night sweats,  Fevers, chills, fatigue, lassitude. HEENT:   headaches,  Difficulty swallowing,  Tooth/dental problems,  Sore throat,                No sneezing, itching, ear ache, nasal congestion, post nasal drip,   CV:  No chest pain,  Orthopnea, PND, swelling in lower extremities, anasarca, dizziness, palpitations  GI  No heartburn, indigestion, abdominal pain, nausea, vomiting, diarrhea, change in bowel habits, loss of appetite  Resp: No shortness of breath with exertion or at rest.  No excess mucus, no productive cough,  No non-productive cough,  No coughing up of blood.  No change in color of mucus.  No wheezing.  No chest wall deformity  Skin: no rash or lesions.  GU: no dysuria, change in color of urine, no urgency or frequency.  No flank pain.  MS:  No joint pain or swelling.  No decreased range of motion.  No back pain.  Psych:  No change in mood  or affect. No depression or anxiety.  No memory loss.     Objective:   Physical ExamThere were no vitals taken for this visit.   CBC Latest Ref Rng & Units 07/04/2020 05/04/2020 03/22/2020  WBC 4.0 - 10.5 K/uL 10.8(H) 9.4 8.2  Hemoglobin 13.0 - 17.0 g/dL 17.4(H) 16.9 17.1(H)  Hematocrit 39 - 52 % 50.0 48.8 50.2  Platelets 150 - 400 K/uL 210 208 192   BMP Latest Ref Rng & Units 07/04/2020 06/30/2020 03/22/2020  Glucose 70 - 99 mg/dL 430(H) 415(H) 434(H)  BUN 6 - 20 mg/dL _0 Creatinine 0.61 - 1.24 mg/dL 1.10 0.90 1.00  BUN/Creat Ratio 9 - 20 - 13 -  Sodium 135 - 145 mmol/L 130(L) 137 133(L)  Potassium 3.5 - 5.1 mmol/L 3.9 4.3 4.1  Chloride 98 - 111 mmol/L 96(L) 98 97(L)  CO2 22 - 32 mmol/L _1 Calcium 8.9 - 10.3 mg/dL 9.1 9.6 9.2  Lipid Panel     Component Value Date/Time   CHOL 169 06/24/2019 1052   TRIG 223 (H) 06/24/2019 1052   HDL 35 (L)  06/24/2019 1052   CHOLHDL 4.8 06/24/2019 1052   LDLCALC 89 06/24/2019 1052   Lab Results  Component Value Date   HGBA1C 8.8 (H) 03/18/2020       Assessment & Plan:  I personally reviewed all images and lab data in the [facility] system as well as any outside material available during this office visit and agree with the  radiology impressions.   Type 2 diabetes mellitus with hyperglycemia, with long-term current use of insulin ([place]) Difficult control type 2 diabetes on complex therapy  We will not make medication change at this time we will have the patient come in for a follow-up visit in the next 3 weeks face-to-face  Toenail fungus Plan is to refill the terbinafine  Essential hypertension Will refill amlodipine   Anthony Hubbard was seen today for fmla paperwork completion.  Diagnoses and all orders for this visit:  Essential hypertension -     losartan (COZAAR) 100 MG tablet; Take 1 tablet (100 mg total) by mouth daily. -     amLODipine (NORVASC) 10 MG tablet; Take 1 tablet (10 mg total) by mouth daily.  Type 2 diabetes mellitus with hyperglycemia, with long-term current use of insulin ([facility]) -     insulin aspart (NOVOLOG FLEXPEN) 100 UNIT/ML FlexPen; Inject 16 Units into the skin 3 (three) times daily with meals. -     Insulin Glargine (BASAGLAR KWIKPEN) 100 UNIT/ML; Inject 40 Units into the skin daily. -     metFORMIN (GLUCOPHAGE) 500 MG tablet; Take 2 tablets (1,000 mg total) by mouth 2 (two) times daily.  Hyperlipidemia associated with type 2 diabetes mellitus ([facility]) -     atorvastatin (LIPITOR) 40 MG tablet; Take 1 tablet (40 mg total) by mouth daily.  Toenail fungus  Other orders -     Semaglutide, 1 MG/DOSE, (OZEMPIC, 1 MG/DOSE,) 2 MG/1.5ML SOPN; Inject 1 mg into the skin once a week. -     Insulin Pen Needle (TRUEPLUS 5-BEVEL PEN NEEDLES) 31G X 5 MM MISC; USE AS DIRECTED FOR LANTUS -     terbinafine (LAMISIL) 250 MG tablet; Take 1 tablet (250 mg total) by mouth daily.   As  to the patient's short-term leave in my opinion he cannot work in any hot or cold environment where he does not have good access to fluids.  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  He needs to be given frequent breaks at least in the midmorning and midafternoon to be able to get nutrition in between meals which is healthy in nature.  He needs to change in a different job's situation such as an office type position or laboratory position.  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  His estimated return to work is dependent upon the accommodations made to the above factors Follow Up Instructions:   Patient knows an in person exam will be set up I discussed the assessment and treatment plan with the patient. The patient was provided an opportunity to ask questions and all were answered. The patient agreed with the plan and demonstrated an understanding of the instructions.   The patient was advised to call back or seek an in-person evaluation if the symptoms worsen or if the condition fails to improve as anticipated.  I provided 30 minutes of non-face-to-face time during this encounter  including  median intraservice time , review of notes, labs, imaging, medications  and explaining diagnosis and management to the patient .    Asencion Noble, MD

## 2020-08-08 NOTE — Assessment & Plan Note (Signed)
Will refill amlodipine

## 2020-09-01 ENCOUNTER — Ambulatory Visit (INDEPENDENT_AMBULATORY_CARE_PROVIDER_SITE_OTHER): Payer: BC Managed Care – PPO | Admitting: Endocrinology

## 2020-09-01 ENCOUNTER — Other Ambulatory Visit: Payer: Self-pay

## 2020-09-01 VITALS — BP 162/88 | HR 82 | Wt 236.2 lb

## 2020-09-01 DIAGNOSIS — Z794 Long term (current) use of insulin: Secondary | ICD-10-CM

## 2020-09-01 DIAGNOSIS — E1165 Type 2 diabetes mellitus with hyperglycemia: Secondary | ICD-10-CM

## 2020-09-01 LAB — POCT GLYCOSYLATED HEMOGLOBIN (HGB A1C): Hemoglobin A1C: 10.2 % — AB (ref 4.0–5.6)

## 2020-09-01 MED ORDER — INSULIN REGULAR HUMAN 100 UNIT/ML IJ SOLN: 19.0000 [IU] | Freq: Three times a day (TID) | INTRAMUSCULAR | 3 refills | Status: DC

## 2020-09-01 MED ORDER — INSULIN NPH (HUMAN) (ISOPHANE) 100 UNIT/ML ~~LOC~~ SUSP
30.0000 [IU] | Freq: Every day | SUBCUTANEOUS | 3 refills | Status: DC
Start: 2020-09-01 — End: 2020-12-26

## 2020-09-01 MED ORDER — FREESTYLE LIBRE 2 SENSOR MISC: 3 refills | Status: DC

## 2020-09-01 NOTE — Patient Instructions (Addendum)
good diet and exercise significantly improve the control of your diabetes.  please let me know if you wish to be referred to a dietician.  high blood sugar is very risky to your health.  you should see an eye doctor and dentist every year.  It is very important to get all recommended vaccinations.  Controlling your blood pressure and cholesterol drastically reduces the damage diabetes does to your body.  Those who smoke should quit.  Please discuss these with your doctor.  check your blood sugar twice a day.  vary the time of day when you check, between before the 3 meals, and at bedtime.  also check if you have symptoms of your blood sugar being too high or too low.  please keep a record of the readings and bring it to your next appointment here (or you can bring the meter itself).  You can write it on any piece of paper.  please call us sooner if your blood sugar goes below 70, or if you have a lot of readings over 200. I have sent prescriptions to [facility], for cheaper insulins, and: Please continue the same other diabetes medications. We will need to take this complex situation in stages. Your blood pressure is high today.  Please see your primary care provider soon, to have it rechecked.  Please come back for a follow-up appointment in 1 month.

## 2020-09-01 NOTE — Progress Notes (Signed)
Subjective:    Patient ID: Anthony Hubbard, male    DOB: 12-26-1985, 34 y.o.   MRN: [id number]  HPI pt is referred by Dr Joya Gaskins, by for diabetes.  Pt states DM was dx'ed in 2013; he is unaware of any chronic complications; he has been on insulin since 2019; pt says his diet and exercise are fair; he has never had pancreatitis, pancreatic surgery, severe hypoglycemia or DKA.  He takes multiple daily injections, metformin, and Ozempic.  Hs has had several episodes of near syncope at work (cbg was 500).  Pt says he sometimes goes without meds, due to cost.  However, he wants to continue Ozempic.   Past Medical History:  Diagnosis Date  . Diabetes mellitus without complication ([place])    type 2   . Hypertension   . Hypertensive urgency 06/07/2019  . Phimosis   . Superficial thrombosis of left lower extremity 06/24/2019    Past Surgical History:  Procedure Laterality Date  . CIRCUMCISION    . I & D EXTREMITY Left 06/10/2019   Procedure: IRRIGATION AND DEBRIDEMENT LEFT LOWER EXTREMITY;  Surgeon: Leandrew Koyanagi, MD;  Location: [facility];  Service: Orthopedics;  Laterality: Left;  . TONSILLECTOMY      Social History   Socioeconomic History  . Marital status: Married    Spouse name: Not on file  . Number of children: Not on file  . Years of education: Not on file  . Highest education level: Not on file  Occupational History  . Not on file  Tobacco Use  . Smoking status: Never Smoker  . Smokeless tobacco: Never Used  Vaping Use  . Vaping Use: Never used  Substance and Sexual Activity  . Alcohol use: No  . Drug use: Not Currently    Types: Marijuana  . Sexual activity: Not on file  Other Topics Concern  . Not on file  Social History Narrative  . Not on file   Social Determinants of Health   Financial Resource Strain:   . Difficulty of Paying Living Expenses: Not on file  Food Insecurity:   . Worried About Charity fundraiser in the Last Year: Not on file  . Ran Out of Food in  the Last Year: Not on file  Transportation Needs:   . Lack of Transportation (Medical): Not on file  . Lack of Transportation (Non-Medical): Not on file  Physical Activity:   . Days of Exercise per Week: Not on file  . Minutes of Exercise per Session: Not on file  Stress:   . Feeling of Stress : Not on file  Social Connections:   . Frequency of Communication with Friends and Family: Not on file  . Frequency of Social Gatherings with Friends and Family: Not on file  . Attends Religious Services: Not on file  . Active Member of Clubs or Organizations: Not on file  . Attends Archivist Meetings: Not on file  . Marital Status: Not on file  Intimate Partner Violence:   . Fear of Current or Ex-Partner: Not on file  . Emotionally Abused: Not on file  . Physically Abused: Not on file  . Sexually Abused: Not on file    Current Outpatient Medications on File Prior to Visit  Medication Sig Dispense Refill  . amLODipine (NORVASC) 10 MG tablet Take 1 tablet (10 mg total) by mouth daily. 90 tablet 2  . atorvastatin (LIPITOR) 40 MG tablet Take 1 tablet (40 mg total) by mouth daily. [place]  tablet 2  . blood glucose meter kit and supplies KIT Dispense based on patient and insurance preference. Use up to four times daily as directed. (FOR ICD-9 250.00, 250.01). 1 each 0  . Continuous Blood Gluc Receiver (FREESTYLE LIBRE 2 READER) DEVI Use to monitor blood sugar 1 each 6  . hydrochlorothiazide (HYDRODIURIL) 25 MG tablet Take 1 tablet (25 mg total) by mouth daily. 90 tablet 3  . ibuprofen (ADVIL) 600 MG tablet Take 1 tablet (600 mg total) by mouth every 8 (eight) hours as needed. 60 tablet 1  . Insulin Pen Needle (TRUEPLUS 5-BEVEL PEN NEEDLES) 31G X 5 MM MISC USE AS DIRECTED FOR LANTUS 100 each 3  . losartan (COZAAR) 100 MG tablet Take 1 tablet (100 mg total) by mouth daily. 90 tablet 1  . metFORMIN (GLUCOPHAGE) 500 MG tablet Take 2 tablets (1,000 mg total) by mouth 2 (two) times daily. 120 tablet  2  . metoprolol succinate (TOPROL-XL) 50 MG 24 hr tablet Take 1 tablet (50 mg total) by mouth daily. Take with or immediately following a meal. 90 tablet 3  . Semaglutide, 1 MG/DOSE, (OZEMPIC, 1 MG/DOSE,) 2 MG/1.5ML SOPN Inject 1 mg into the skin once a week. 3 mL 1  . terbinafine (LAMISIL) 250 MG tablet Take 1 tablet (250 mg total) by mouth daily. 90 tablet 0   No current facility-administered medications on file prior to visit.    No Known Allergies  Family History  Problem Relation Age of Onset  . Diabetes Father   . Diabetes Paternal Uncle     BP (!) 162/88   Pulse 82   Wt 236 lb 3.2 oz (107.1 kg)   SpO2 95%   BMI 34.88 kg/m   Review of Systems denies weight loss, blurry vision, sob, n/v, urinary frequency, and depression.       Objective:   Physical Exam VITAL SIGNS:  See vs page GENERAL: no distress Pulses: dorsalis pedis intact bilat.   MSK: no deformity of the feet CV: no leg edema Skin:  no ulcer on the feet.  normal color and temp on the feet. Neuro: sensation is intact to touch on the feet. Ext: there is bilateral onychomycosis of the toenails  Lab Results  Component Value Date   HGBA1C 10.2 (A) 09/01/2020   I have reviewed outside records, and summarized: Pt was noted to have elevated A1c, and referred here.  HTN and onychomycosis were also addressed.      Assessment & Plan:  Insulin-requiring type 2 DM: uncontrolled. HTN: is noted today.   Patient Instructions  good diet and exercise significantly improve the control of your diabetes.  please let me know if you wish to be referred to a dietician.  high blood sugar is very risky to your health.  you should see an eye doctor and dentist every year.  It is very important to get all recommended vaccinations.  Controlling your blood pressure and cholesterol drastically reduces the damage diabetes does to your body.  Those who smoke should quit.  Please discuss these with your doctor.  check your blood  sugar twice a day.  vary the time of day when you check, between before the 3 meals, and at bedtime.  also check if you have symptoms of your blood sugar being too high or too low.  please keep a record of the readings and bring it to your next appointment here (or you can bring the meter itself).  You can write it on any  piece of paper.  please call us sooner if your blood sugar goes below 70, or if you have a lot of readings over 200. I have sent prescriptions to [facility], for cheaper insulins, and: Please continue the same other diabetes medications. We will need to take this complex situation in stages. Your blood pressure is high today.  Please see your primary care provider soon, to have it rechecked.  Please come back for a follow-up appointment in 1 month.

## 2020-09-21 ENCOUNTER — Telehealth: Payer: Self-pay | Admitting: Critical Care Medicine

## 2020-09-21 NOTE — Telephone Encounter (Signed)
Copied from CRM [id number]. Topic: General - Inquiry >> Sep 20, 2020  5:10 PM Adrian Prince D wrote: Reason for CRM: Patient called to get an update on his form sent to the office from [place]. He said that they have sent it 3 times. He can be reached at [phone]. Please advise

## 2020-09-22 ENCOUNTER — Ambulatory Visit: Payer: BC Managed Care – PPO | Attending: Critical Care Medicine | Admitting: Critical Care Medicine

## 2020-09-22 ENCOUNTER — Encounter: Payer: Self-pay | Admitting: Critical Care Medicine

## 2020-09-22 ENCOUNTER — Other Ambulatory Visit: Payer: Self-pay

## 2020-09-22 VITALS — BP 131/89 | HR 83 | Temp 97.7°F | Ht 69.0 in | Wt 241.0 lb

## 2020-09-22 DIAGNOSIS — I10 Essential (primary) hypertension: Secondary | ICD-10-CM | POA: Diagnosis not present

## 2020-09-22 DIAGNOSIS — K029 Dental caries, unspecified: Secondary | ICD-10-CM | POA: Diagnosis not present

## 2020-09-22 DIAGNOSIS — Z1159 Encounter for screening for other viral diseases: Secondary | ICD-10-CM

## 2020-09-22 DIAGNOSIS — E1165 Type 2 diabetes mellitus with hyperglycemia: Secondary | ICD-10-CM

## 2020-09-22 DIAGNOSIS — Z794 Long term (current) use of insulin: Secondary | ICD-10-CM | POA: Diagnosis not present

## 2020-09-22 DIAGNOSIS — E1169 Type 2 diabetes mellitus with other specified complication: Secondary | ICD-10-CM

## 2020-09-22 DIAGNOSIS — E785 Hyperlipidemia, unspecified: Secondary | ICD-10-CM

## 2020-09-22 NOTE — Assessment & Plan Note (Signed)
We are at goal with patient's hyperlipidemia

## 2020-09-22 NOTE — Assessment & Plan Note (Signed)
Hypertension under good control we will continue current medication profile 

## 2020-09-22 NOTE — Assessment & Plan Note (Signed)
Severe dental caries have recommended dentist the patient said he will follow up and schedule appointment

## 2020-09-22 NOTE — Patient Instructions (Addendum)
No change in medications  Keep your upcoming appointment with Dr. Everardo All Urine today for microalbumin and a blood draw for hepatitis C screen  You qualify for a pfizer covid booster, please obtain in January at 6 months  Return 2 months

## 2020-09-22 NOTE — Telephone Encounter (Signed)
Per chart review original paperwork was submitted and approved for 3 wks of disability, pt requested extension to 6 wks at that time and was to request ext paperwork be faxed to provider from [facility], paperwork was never received, contacted [facility] and requested paperwork for completion after pt's appt this morning, they report that paperwork was faxed on 10/22 but they will fax this morning, CMA, Alesia Banda made aware and will look out for fax and inform pt of status upon arrival for appt.

## 2020-09-22 NOTE — Progress Notes (Signed)
Subjective:    Patient ID: Anthony Hubbard, male    DOB: 1986-08-30, 34 y.o.   MRN: [id number]  34 y.o.M with type 2 diabetes and hypertension and recent admission for left lower extremity cellulitis and abscess with need for drainage per orthopedics  This is a follow-up visit for hypertension type 2 diabetes and recent admission for left lower extremity cellulitis and abscess with drainage.  The patient's lower extremity is improving and wound care has been ongoing per orthopedics.  Recent notes from orthopedics suggest the wound is slowly healing.  The patient still on Keflex for antibiotic coverage of gram-positive cocci from the cultures.  The patient maintains the Lantus and also now on Trulicity.  Blood sugars have been ranging in the lower area of 1 20-1 40 at home.  The patient is due tetanus vaccine and flu shot.  Patient also needs a foot exam as well as microalbumin check in the urine.  Note the patient has still some pain particular with dressing changes on the wound.  11/17/2019 This is a follow-up visit from a September 2020 visit.  The patient is being treated for type 2 diabetes and hyperlipidemia.  Patient also had cellulitis and abscess of the left leg.  The cellulitis and abscess have resolved and the wound is healed.  The patient is now off antibiotics.  The patient is being followed for diabetes and hypertension.  Noted home he states his blood pressures in the 140/80 range.  His blood sugars have been in the 1 20-1 40 range fasting and then get up to mid 200s postprandial  He has not been compliant with his diet.  Note today's hemoglobin A1c is 11 and his CBG is 260  The patient tends to eat a lot in different times and does not take snacks that are healthy in for him between meals but instead eats unhealthy snacks  01/12/2020 The patient returns today for follow-up and continues to run high sugars although not as high as previous visits.  His fastings were in the 200  range postprandial 300.  Previously he was fasting in the 450 range.  He is not been able to access his NovoLog due to insurance barriers.  He is on the Lantus at 55 units daily and is on Metformin at 1000 mg twice daily as well now is on Ozempic 0.5 weekly  Blood pressure showing some improved control  We have been giving the patient counseling with regards to his diet   There are no other new complaints   03/29/2020 This patient is seen as a work in visit as he had gone to the ER recently with hyperglycemia again.  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  Initially was working the first shift going in at 4:30 in the morning getting off at 2:30 in the afternoon in the situation he would skip his breakfast.  At other times he is shifted over to an afternoon shift going into work at 2:30 PM getting off at 10:30 PM in that circumstance he found himself skipping dinner or going out and having fast food.  He states normally his blood sugars are 120 fasting and 180 after eating more recently they have been increasing since eating more fast food and skipping meals  He is to be on Lantus 15 units daily Ozempic 1 injection weekly and NovoLog 11 units 3 times a day with  meals but he has been skipping meals  05/03/2020 Patient seen today in return follow-up blood sugars have been improved he typically runs in the 160s in the middle of the day preprandial 180s postprandial sometimes overnight he will run in the 160s as well.  Very few blood glucoses are over 200 these days compared to prior.  He did receive his first Covid vaccine which was a Estate manager/land agent vaccine recently.  He has no new complaints at this time.  He is working at [employer] in [employer] and works from 3-11  His dietary habits have improved however he occasionally skips dinner The patient's diabetes protocol is as below however he occasionally  skips the evening dose of NovoLog when he does not eat dinner Increase lantus to 20units daily Increase ozempic to 39m weekly Cont tid novolog 11 units with meals Blood pressure has been trending up and today is 147/107 on arrival he is on the amlodipine 10 mg a day losartan 100 mg a day and HCTZ 12.5 daily  The patient does struggle with poor dentition and has carious teeth in the molar distribution in his right lower jaw that need removal  06/30/2020 This patient is seen today in the mobile health clinic as a walk-in.  He works in a wPhysiological scientistand extreme temperatures.  He had an episode of syncope yesterday.  His blood sugar has been running in 3-400 range recently.  When I ask him what type of diet he has these been eating a lot of peanut butter sandwiches and smoothies with high sugar content.  He has also been skipping lunch.  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 The patient states he has been compliant with his insulin but has not been taking the midday insulin dose. On arrival the patient's blood sugar was 417 after  10 units of regular NovoLog it was down to 410.  The patient states he has been trying to keep up with drinking plenty of water while on the job.   Note on arrival also his blood pressure is 153/99 The patient brings with him a job restriction accommodation form to be filled out and he was asked by his employer to take a week off  to improve his health status.  07/04/2020 This patient is seen as a work in when he went to the emergency room yesterday spent 9 hours and then left that being seen.  At that time lab data did show hyponatremia blood sugar of 417 significant volume depletion negative troponin negative EKG otherwise unremarkable lab data  He states this morning his blood sugars were in the upper 200s on  arrival today is 383.  His girlfriend states he is not been eating meals on a regular basis and drinking beer he states he is eating meals somehow he is not being transparent with our team on this information.  09/22/2020 Is a 34year old male seen in follow-up for type 2 diabetes poorly controlled.  This patient is improving with his glycemic control.  He has more values in the 100 range.  On arrival today is 194.  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  Patient on arrival his blood pressure 130/89.  Patient is try to be more compliant with exercise and his diet.  Patient is fully vaccinated with Covid.  He does need hepatitis C screen. Patient is yet to see a dentist he has numerous carious teeth poor dentition and periodontal disease  Past Medical History:  Diagnosis Date  . Diabetes mellitus without complication ([place])    type 2   . Hypertension   . Hypertensive urgency 06/07/2019  . Phimosis   . Superficial thrombosis of left lower extremity 06/24/2019     Family History  Problem Relation Age of Onset  . Diabetes Father   . Diabetes Paternal Uncle      Social History   Socioeconomic History  . Marital status: Married    Spouse name: Not on file  . Number of children: Not on file  . Years of education: Not on file  . Highest education level: Not on file  Occupational History  . Not on file  Tobacco Use  . Smoking status: Never Smoker  . Smokeless tobacco: Never Used  Vaping Use  . Vaping Use: Never used  Substance and Sexual Activity  . Alcohol use: No  . Drug use: Not Currently    Types: Marijuana  . Sexual activity: Not on file  Other Topics Concern  . Not on file  Social History Narrative  . Not on file   Social Determinants of Health    Financial Resource Strain:   . Difficulty of Paying Living Expenses: Not on file  Food Insecurity:   . Worried About Charity fundraiser in the Last Year: Not on file  . Ran Out of Food in the Last Year: Not on file  Transportation Needs:   . Lack of Transportation (Medical): Not on file  . Lack of Transportation (Non-Medical): Not on file  Physical Activity:   . Days of Exercise per Week: Not on file  . Minutes of Exercise per Session: Not on file  Stress:   . Feeling of Stress : Not on file  Social Connections:   . Frequency of Communication with Friends and Family: Not on file  . Frequency of Social Gatherings with Friends and Family: Not on file  . Attends Religious Services: Not on file  . Active Member of Clubs or Organizations: Not on file  . Attends Archivist Meetings: Not on file  . Marital Status: Not on file  Intimate Partner Violence:   . Fear of Current or Ex-Partner: Not on file  . Emotionally Abused: Not on file  . Physically Abused: Not on file  . Sexually Abused: Not on file     No Known Allergies   Outpatient Medications Prior to Visit  Medication Sig Dispense Refill  . amLODipine (NORVASC) 10 MG tablet Take 1 tablet (10 mg total) by mouth daily. 90 tablet 2  . atorvastatin (LIPITOR) 40 MG tablet Take 1 tablet (40 mg total) by mouth daily. 90 tablet 2  . Continuous Blood Gluc Receiver (FREESTYLE LIBRE 2 READER) DEVI Use to monitor blood sugar 1 each 6  . Continuous Blood Gluc Sensor (FREESTYLE LIBRE 2 SENSOR) MISC Use to monitor blood sugar 6 each 3  . hydrochlorothiazide (HYDRODIURIL) 25 MG tablet Take 1 tablet (25 mg total) by mouth daily. 90 tablet 3  . insulin NPH Human (NOVOLIN N) 100 UNIT/ML injection Inject 0.3 mLs (30  Units total) into the skin at bedtime. 30 mL 3  . Insulin Pen Needle (TRUEPLUS 5-BEVEL PEN NEEDLES) 31G X 5 MM MISC USE AS DIRECTED FOR LANTUS 100 each 3  . insulin regular (NOVOLIN R) 100 units/mL injection Inject 0.19 mLs  (19 Units total) into the skin 3 (three) times daily before meals. And syringes 4/day 20 mL 3  . losartan (COZAAR) 100 MG tablet Take 1 tablet (100 mg total) by mouth daily. 90 tablet 1  . metFORMIN (GLUCOPHAGE) 500 MG tablet Take 2 tablets (1,000 mg total) by mouth 2 (two) times daily. 120 tablet 2  . metoprolol succinate (TOPROL-XL) 50 MG 24 hr tablet Take 1 tablet (50 mg total) by mouth daily. Take with or immediately following a meal. 90 tablet 3  . Semaglutide, 1 MG/DOSE, (OZEMPIC, 1 MG/DOSE,) 2 MG/1.5ML SOPN Inject 1 mg into the skin once a week. 3 mL 1  . terbinafine (LAMISIL) 250 MG tablet Take 1 tablet (250 mg total) by mouth daily. 90 tablet 0  . BD INSULIN SYRINGE U/F 31G X 5/16" 0.3 ML MISC     . blood glucose meter kit and supplies KIT Dispense based on patient and insurance preference. Use up to four times daily as directed. (FOR ICD-9 250.00, 250.01). (Patient not taking: Reported on 09/22/2020) 1 each 0  . ibuprofen (ADVIL) 600 MG tablet Take 1 tablet (600 mg total) by mouth every 8 (eight) hours as needed. (Patient not taking: Reported on 09/22/2020) 60 tablet 1   No facility-administered medications prior to visit.    Review of Systems Constitutional:   No  weight loss, night sweats,  Fevers, chills, fatigue, lassitude. HEENT:   headaches,  Difficulty swallowing,  Tooth/dental problems,  Sore throat,                No sneezing, itching, ear ache, nasal congestion, post nasal drip,   CV:  No chest pain,  Orthopnea, PND, swelling in lower extremities, anasarca, dizziness, palpitations  GI  No heartburn, indigestion, abdominal pain, nausea, vomiting, diarrhea, change in bowel habits, loss of appetite  Resp: No shortness of breath with exertion or at rest.  No excess mucus, no productive cough,  No non-productive cough,  No coughing up of blood.  No change in color of mucus.  No wheezing.  No chest wall deformity  Skin: no rash or lesions.  GU: no dysuria, change in color of  urine, no urgency or frequency.  No flank pain.  MS:  No joint pain or swelling.  No decreased range of motion.  No back pain.  Psych:  No change in mood or affect. No depression or anxiety.  No memory loss.     Objective:   Physical ExamBP 131/89 (BP Location: Left Arm, Patient Position: Sitting, Cuff Size: Normal)   Pulse 83   Temp 97.7 F (36.5 C) (Temporal)   Ht '5\' 9"'  (1.753 m)   Wt 241 lb (109.3 kg)   SpO2 97%   BMI 35.59 kg/m   Gen: Pleasant, well-nourished, in no distress,  normal affect  ENT: Poor dentition with numerous carious teeth in the molar distribution right lower and left lower jaw mouth clear,  oropharynx clear, no postnasal drip  Neck: No JVD, no TMG, no carotid bruits  Lungs: No use of accessory muscles, no dullness to percussion, clear without rales or rhonchi  Cardiovascular: RRR, heart sounds normal, no murmur or gallops, no peripheral edema  Abdomen: soft and NT, no HSM,  BS normal  Musculoskeletal: slight swelling left lateral calf no evidence infection or DVT Neuro: alert, non focal  Skin: Warm, no lesions or rashes   CBC Latest Ref Rng & Units 07/04/2020 05/04/2020 03/22/2020  WBC 4.0 - 10.5 K/uL 10.8(H) 9.4 8.2  Hemoglobin 13.0 - 17.0 g/dL 17.4(H) 16.9 17.1(H)  Hematocrit 39 - 52 % 50.0 48.8 50.2  Platelets 150 - 400 K/uL 210 208 192   BMP Latest Ref Rng & Units 07/04/2020 06/30/2020 03/22/2020  Glucose 70 - 99 mg/dL 430(H) 415(H) 434(H)  BUN 6 - 20 mg/dL '13 12 15  ' Creatinine 0.61 - 1.24 mg/dL 1.10 0.90 1.00  BUN/Creat Ratio 9 - 20 - 13 -  Sodium 135 - 145 mmol/L 130(L) 137 133(L)  Potassium 3.5 - 5.1 mmol/L 3.9 4.3 4.1  Chloride 98 - 111 mmol/L 96(L) 98 97(L)  CO2 22 - 32 mmol/L '25 22 24  ' Calcium 8.9 - 10.3 mg/dL 9.1 9.6 9.2  Lipid Panel     Component Value Date/Time   CHOL 169 06/24/2019 1052   TRIG 223 (H) 06/24/2019 1052   HDL 35 (L) 06/24/2019 1052   CHOLHDL 4.8 06/24/2019 1052   LDLCALC 89 06/24/2019 1052   Lab Results  Component  Value Date   HGBA1C 10.2 (A) 09/01/2020   cbg 194     Assessment & Plan:  I personally reviewed all images and lab data in the [facility] system as well as any outside material available during this office visit and agree with the  radiology impressions.   Essential hypertension Hypertension under good control we will continue current medication profile  Dental caries Severe dental caries have recommended dentist the patient said he will follow up and schedule appointment  Hyperlipidemia associated with type 2 diabetes mellitus ([place]) We are at goal with patient's hyperlipidemia  Type 2 diabetes mellitus with hyperglycemia, with long-term current use of insulin ([place]) Improved glycemic control after being switched by endocrinology to new formulations of insulin patient is also on Ozempic and Metformin  Patient to continue current profile of medications per endocrinology and has a follow-up appointment with endocrinology encouraged to keep this appointment  We will check a microalbumin at this visit   Anthony Hubbard was seen today for follow-up.  Diagnoses and all orders for this visit:  Type 2 diabetes mellitus with hyperglycemia, with long-term current use of insulin ([facility]) -     Glucose (CBG) -     Microalbumin/Creatinine Ratio, Urine  Need for hepatitis C screening test -     Hepatitis C Antibody  Essential hypertension  Dental caries  Hyperlipidemia associated with type 2 diabetes mellitus ([place])

## 2020-09-22 NOTE — Assessment & Plan Note (Signed)
Improved glycemic control after being switched by endocrinology to new formulations of insulin patient is also on Ozempic and Metformin  Patient to continue current profile of medications per endocrinology and has a follow-up appointment with endocrinology encouraged to keep this appointment  We will check a microalbumin at this visit

## 2020-09-23 ENCOUNTER — Telehealth: Payer: Self-pay

## 2020-09-23 LAB — HEPATITIS C ANTIBODY: Hep C Virus Ab: <0.1 s/co ratio (ref 0.0–0.9)

## 2020-09-23 NOTE — Telephone Encounter (Signed)
Contacted pt to go over lab results pt is aware and doesn't have any questions or concerns 

## 2020-10-03 ENCOUNTER — Ambulatory Visit: Payer: BC Managed Care – PPO | Admitting: Endocrinology

## 2020-10-24 ENCOUNTER — Telehealth: Payer: Self-pay | Admitting: Critical Care Medicine

## 2020-10-24 NOTE — Telephone Encounter (Signed)
Received fax from [employer] with paperwork for disability to be filled out. Placed in PCP box.

## 2020-10-26 ENCOUNTER — Encounter: Payer: Self-pay | Admitting: Critical Care Medicine

## 2020-10-26 NOTE — Telephone Encounter (Signed)
Spoke w/pt appt scheduled to have forms completed

## 2020-10-26 NOTE — Progress Notes (Signed)
Pt wants forms to state that he can not work in the conditions at [employer] having fainting spells  Pt needs refills on [employer] sensors

## 2020-10-27 ENCOUNTER — Ambulatory Visit (HOSPITAL_BASED_OUTPATIENT_CLINIC_OR_DEPARTMENT_OTHER): Payer: BC Managed Care – PPO | Admitting: Critical Care Medicine

## 2020-10-27 ENCOUNTER — Other Ambulatory Visit: Payer: Self-pay

## 2020-10-27 DIAGNOSIS — Z794 Long term (current) use of insulin: Secondary | ICD-10-CM

## 2020-10-27 DIAGNOSIS — E1165 Type 2 diabetes mellitus with hyperglycemia: Secondary | ICD-10-CM

## 2020-10-27 NOTE — Progress Notes (Deleted)
Subjective:    Patient ID: Anthony Hubbard, male    DOB: 10-03-1986, 34 y.o.   MRN: [id number]  34 y.o.M with type 2 diabetes and hypertension and recent admission for left lower extremity cellulitis and abscess with need for drainage per orthopedics  This is a follow-up visit for hypertension type 2 diabetes and recent admission for left lower extremity cellulitis and abscess with drainage.  The patient's lower extremity is improving and wound care has been ongoing per orthopedics.  Recent notes from orthopedics suggest the wound is slowly healing.  The patient still on Keflex for antibiotic coverage of gram-positive cocci from the cultures.  The patient maintains the Lantus and also now on Trulicity.  Blood sugars have been ranging in the lower area of 1 20-1 40 at home.  The patient is due tetanus vaccine and flu shot.  Patient also needs a foot exam as well as microalbumin check in the urine.  Note the patient has still some pain particular with dressing changes on the wound.  11/17/2019 This is a follow-up visit from a September 2020 visit.  The patient is being treated for type 2 diabetes and hyperlipidemia.  Patient also had cellulitis and abscess of the left leg.  The cellulitis and abscess have resolved and the wound is healed.  The patient is now off antibiotics.  The patient is being followed for diabetes and hypertension.  Noted home he states his blood pressures in the 140/80 range.  His blood sugars have been in the 1 20-1 40 range fasting and then get up to mid 200s postprandial  He has not been compliant with his diet.  Note today's hemoglobin A1c is 11 and his CBG is 260  The patient tends to eat a lot in different times and does not take snacks that are healthy in for him between meals but instead eats unhealthy snacks  01/12/2020 The patient returns today for follow-up and continues to run high sugars although not as high as previous visits.  His fastings were in the 200  range postprandial 300.  Previously he was fasting in the 450 range.  He is not been able to access his NovoLog due to insurance barriers.  He is on the Lantus at 55 units daily and is on Metformin at 1000 mg twice daily as well now is on Ozempic 0.5 weekly  Blood pressure showing some improved control  We have been giving the patient counseling with regards to his diet   There are no other new complaints   03/29/2020 This patient is seen as a work in visit as he had gone to the ER recently with hyperglycemia again.  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  Initially was working the first shift going in at 4:30 in the morning getting off at 2:30 in the afternoon in the situation he would skip his breakfast.  At other times he is shifted over to an afternoon shift going into work at 2:30 PM getting off at 10:30 PM in that circumstance he found himself skipping dinner or going out and having fast food.  He states normally his blood sugars are 120 fasting and 180 after eating more recently they have been increasing since eating more fast food and skipping meals  He is to be on Lantus 15 units daily Ozempic 1 injection weekly and NovoLog 11 units 3 times a day with  meals but he has been skipping meals  05/03/2020 Patient seen today in return follow-up blood sugars have been improved he typically runs in the 160s in the middle of the day preprandial 180s postprandial sometimes overnight he will run in the 160s as well.  Very few blood glucoses are over 200 these days compared to prior.  He did receive his first Covid vaccine which was a Estate manager/land agent vaccine recently.  He has no new complaints at this time.  He is working at [employer] in [employer] and works from 3-11  His dietary habits have improved however he occasionally skips dinner The patient's diabetes protocol is as below however he occasionally  skips the evening dose of NovoLog when he does not eat dinner Increase lantus to 20units daily Increase ozempic to 4m weekly Cont tid novolog 11 units with meals Blood pressure has been trending up and today is 147/107 on arrival he is on the amlodipine 10 mg a day losartan 100 mg a day and HCTZ 12.5 daily  The patient does struggle with poor dentition and has carious teeth in the molar distribution in his right lower jaw that need removal  06/30/2020 This patient is seen today in the mobile health clinic as a walk-in.  He works in a wPhysiological scientistand extreme temperatures.  He had an episode of syncope yesterday.  His blood sugar has been running in 3-400 range recently.  When I ask him what type of diet he has these been eating a lot of peanut butter sandwiches and smoothies with high sugar content.  He has also been skipping lunch.  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 The patient states he has been compliant with his insulin but has not been taking the midday insulin dose. On arrival the patient's blood sugar was 417 after  10 units of regular NovoLog it was down to 410.  The patient states he has been trying to keep up with drinking plenty of water while on the job.   Note on arrival also his blood pressure is 153/99 The patient brings with him a job restriction accommodation form to be filled out and he was asked by his employer to take a week off  to improve his health status.  07/04/2020 This patient is seen as a work in when he went to the emergency room yesterday spent 9 hours and then left that being seen.  At that time lab data did show hyponatremia blood sugar of 417 significant volume depletion negative troponin negative EKG otherwise unremarkable lab data  He states this morning his blood sugars were in the upper 200s on  arrival today is 383.  His girlfriend states he is not been eating meals on a regular basis and drinking beer he states he is eating meals somehow he is not being transparent with our team on this information.  09/22/2020 Is a 34year old male seen in follow-up for type 2 diabetes poorly controlled.  This patient is improving with his glycemic control.  He has more values in the 100 range.  On arrival today is 194.  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  Patient on arrival his blood pressure 130/89.  Patient is try to be more compliant with exercise and his diet.  Patient is fully vaccinated with Covid.  He does need hepatitis C screen. Patient is yet to see a dentist he has numerous carious teeth poor dentition and periodontal disease   10/27/2020  Essential hypertension Hypertension under good control we will continue current medication profile  Dental caries Severe dental caries have recommended dentist the patient said he will follow up and schedule appointment  Hyperlipidemia associated with type 2 diabetes mellitus ([place]) We are at goal with patient's hyperlipidemia  Type 2 diabetes mellitus with hyperglycemia, with long-term current use of insulin ([place]) Improved glycemic control after being switched by endocrinology to new formulations of insulin patient is also on Ozempic and Metformin  Patient to continue current profile of medications per endocrinology and has a follow-up appointment with endocrinology encouraged to keep this appointment  We will check a microalbumin at this visit   Anthony Hubbard was seen today for follow-up.  Diagnoses and all orders for this visit:  Type 2 diabetes mellitus with hyperglycemia, with long-term current use of insulin  (HCC) -     Glucose (CBG) -     Microalbumin/Creatinine Ratio, Urine  Need for hepatitis C screening test -     Hepatitis C Antibody  Essential hypertension  Dental caries  Hyperlipidemia associated with type 2 diabetes mellitus ([place])     Past Medical History:  Diagnosis Date  . Diabetes mellitus without complication ([place])    type 2   . Hypertension   . Hypertensive urgency 06/07/2019  . Phimosis   . Superficial thrombosis of left lower extremity 06/24/2019     Family History  Problem Relation Age of Onset  . Diabetes Father   . Diabetes Paternal Uncle      Social History   Socioeconomic History  . Marital status: Married    Spouse name: Not on file  . Number of children: Not on file  . Years of education: Not on file  . Highest education level: Not on file  Occupational History  . Not on file  Tobacco Use  . Smoking status: Never Smoker  . Smokeless tobacco: Never Used  Vaping Use  . Vaping Use: Never used  Substance and Sexual Activity  . Alcohol use: No  . Drug use: Not Currently    Types: Marijuana  . Sexual activity: Not on file  Other Topics Concern  . Not on file  Social History Narrative  . Not on file   Social Determinants of Health   Financial Resource Strain:   . Difficulty of Paying Living Expenses: Not on file  Food Insecurity:   . Worried About Charity fundraiser in the Last Year: Not on file  . Ran Out of Food in the Last Year: Not on file  Transportation Needs:   . Lack of Transportation (Medical): Not on file  . Lack of Transportation (Non-Medical): Not on file  Physical Activity:   . Days of Exercise per Week: Not on file  . Minutes of Exercise per Session: Not on file  Stress:   . Feeling of Stress : Not on file  Social Connections:   . Frequency of Communication with Friends and Family: Not on file  . Frequency of Social Gatherings with Friends and Family: Not on file  . Attends Religious Services: Not  on file  . Active  Member of Clubs or Organizations: Not on file  . Attends Archivist Meetings: Not on file  . Marital Status: Not on file  Intimate Partner Violence:   . Fear of Current or Ex-Partner: Not on file  . Emotionally Abused: Not on file  . Physically Abused: Not on file  . Sexually Abused: Not on file     No Known Allergies   Outpatient Medications Prior to Visit  Medication Sig Dispense Refill  . amLODipine (NORVASC) 10 MG tablet Take 1 tablet (10 mg total) by mouth daily. 90 tablet 2  . atorvastatin (LIPITOR) 40 MG tablet Take 1 tablet (40 mg total) by mouth daily. 90 tablet 2  . BD INSULIN SYRINGE U/F 31G X 5/16" 0.3 ML MISC     . Continuous Blood Gluc Receiver (FREESTYLE LIBRE 2 READER) DEVI Use to monitor blood sugar 1 each 6  . Continuous Blood Gluc Sensor (FREESTYLE LIBRE 2 SENSOR) MISC Use to monitor blood sugar 6 each 3  . hydrochlorothiazide (HYDRODIURIL) 25 MG tablet Take 1 tablet (25 mg total) by mouth daily. 90 tablet 3  . insulin NPH Human (NOVOLIN N) 100 UNIT/ML injection Inject 0.3 mLs (30 Units total) into the skin at bedtime. 30 mL 3  . Insulin Pen Needle (TRUEPLUS 5-BEVEL PEN NEEDLES) 31G X 5 MM MISC USE AS DIRECTED FOR LANTUS 100 each 3  . insulin regular (NOVOLIN R) 100 units/mL injection Inject 0.19 mLs (19 Units total) into the skin 3 (three) times daily before meals. And syringes 4/day 20 mL 3  . losartan (COZAAR) 100 MG tablet Take 1 tablet (100 mg total) by mouth daily. 90 tablet 1  . metFORMIN (GLUCOPHAGE) 500 MG tablet Take 2 tablets (1,000 mg total) by mouth 2 (two) times daily. 120 tablet 2  . metoprolol succinate (TOPROL-XL) 50 MG 24 hr tablet Take 1 tablet (50 mg total) by mouth daily. Take with or immediately following a meal. 90 tablet 3  . terbinafine (LAMISIL) 250 MG tablet Take 1 tablet (250 mg total) by mouth daily. 90 tablet 0  . blood glucose meter kit and supplies KIT Dispense based on patient and insurance preference. Use up to four times daily  as directed. (FOR ICD-9 250.00, 250.01). (Patient not taking: Reported on 09/22/2020) 1 each 0  . ibuprofen (ADVIL) 600 MG tablet Take 1 tablet (600 mg total) by mouth every 8 (eight) hours as needed. (Patient not taking: Reported on 09/22/2020) 60 tablet 1   No facility-administered medications prior to visit.    Review of Systems  Constitutional:   No  weight loss, night sweats,  Fevers, chills, fatigue, lassitude. HEENT:   headaches,  Difficulty swallowing,  Tooth/dental problems,  Sore throat,                No sneezing, itching, ear ache, nasal congestion, post nasal drip,   CV:  No chest pain,  Orthopnea, PND, swelling in lower extremities, anasarca, dizziness, palpitations  GI  No heartburn, indigestion, abdominal pain, nausea, vomiting, diarrhea, change in bowel habits, loss of appetite  Resp: No shortness of breath with exertion or at rest.  No excess mucus, no productive cough,  No non-productive cough,  No coughing up of blood.  No change in color of mucus.  No wheezing.  No chest wall deformity  Skin: no rash or lesions.  GU: no dysuria, change in color of urine, no urgency or frequency.  No flank pain.  MS:  No joint pain or swelling.  No decreased range of motion.  No back pain.  Psych:  No change in mood or affect. No depression or anxiety.  No memory loss.     Objective:   Physical Exam There were no vitals taken for this visit.  Gen: Pleasant, well-nourished, in no distress,  normal affect  ENT: Poor dentition with numerous carious teeth in the molar distribution right lower and left lower jaw mouth clear,  oropharynx clear, no postnasal drip  Neck: No JVD, no TMG, no carotid bruits  Lungs: No use of accessory muscles, no dullness to percussion, clear without rales or rhonchi  Cardiovascular: RRR, heart sounds normal, no murmur or gallops, no peripheral edema  Abdomen: soft and NT, no HSM,  BS normal  Musculoskeletal: slight swelling left lateral calf no  evidence infection or DVT Neuro: alert, non focal  Skin: Warm, no lesions or rashes   CBC Latest Ref Rng & Units 07/04/2020 05/04/2020 03/22/2020  WBC 4.0 - 10.5 K/uL 10.8(H) 9.4 8.2  Hemoglobin 13.0 - 17.0 g/dL 17.4(H) 16.9 17.1(H)  Hematocrit 39 - 52 % 50.0 48.8 50.2  Platelets 150 - 400 K/uL 210 208 192   BMP Latest Ref Rng & Units 07/04/2020 06/30/2020 03/22/2020  Glucose 70 - 99 mg/dL 430(H) 415(H) 434(H)  BUN 6 - 20 mg/dL '13 12 15  ' Creatinine 0.61 - 1.24 mg/dL 1.10 0.90 1.00  BUN/Creat Ratio 9 - 20 - 13 -  Sodium 135 - 145 mmol/L 130(L) 137 133(L)  Potassium 3.5 - 5.1 mmol/L 3.9 4.3 4.1  Chloride 98 - 111 mmol/L 96(L) 98 97(L)  CO2 22 - 32 mmol/L '25 22 24  ' Calcium 8.9 - 10.3 mg/dL 9.1 9.6 9.2  Lipid Panel     Component Value Date/Time   CHOL 169 06/24/2019 1052   TRIG 223 (H) 06/24/2019 1052   HDL 35 (L) 06/24/2019 1052   CHOLHDL 4.8 06/24/2019 1052   LDLCALC 89 06/24/2019 1052   Lab Results  Component Value Date   HGBA1C 10.2 (A) 09/01/2020   cbg 194     Assessment & Plan:  I personally reviewed all images and lab data in the [facility] system as well as any outside material available during this office visit and agree with the  radiology impressions.   No problem-specific Assessment & Plan notes found for this encounter.   There are no diagnoses linked to this encounter.

## 2020-10-31 NOTE — Progress Notes (Signed)
Patient ID: Anthony Hubbard, male   DOB: 1986/06/23, 34 y.o.   MRN: [id number] Pt was a no show

## 2020-11-06 NOTE — Progress Notes (Signed)
Subjective:    Patient ID: Anthony Hubbard, male    DOB: December 21, 1985, 34 y.o.   MRN: [id number] Virtual Visit via Telephone Note  I connected with [name] on 11/07/20 at  9:00 AM EST by telephone and verified that I am speaking with the correct person using two identifiers.   Consent:  I discussed the limitations, risks, security and privacy concerns of performing an evaluation and management service by telephone and the availability of in person appointments. I also discussed with the patient that there may be a patient responsible charge related to this service. The patient expressed understanding and agreed to proceed.  Location of patient: Patient is at home  Location of provider: I am in my office  Persons participating in the televisit with the patient.   No one else on the call    History of Present Illness: 34 y.o.M with type 2 diabetes and hypertension and recent admission for left lower extremity cellulitis and abscess with need for drainage per orthopedics  This is a follow-up visit for hypertension type 2 diabetes and recent admission for left lower extremity cellulitis and abscess with drainage.  The patient's lower extremity is improving and wound care has been ongoing per orthopedics.  Recent notes from orthopedics suggest the wound is slowly healing.  The patient still on Keflex for antibiotic coverage of gram-positive cocci from the cultures.  The patient maintains the Lantus and also now on Trulicity.  Blood sugars have been ranging in the lower area of 1 20-1 40 at home.  The patient is due tetanus vaccine and flu shot.  Patient also needs a foot exam as well as microalbumin check in the urine.  Note the patient has still some pain particular with dressing changes on the wound.  11/17/2019 This is a follow-up visit from a September 2020 visit.  The patient is being treated for type 2 diabetes and hyperlipidemia.  Patient also had cellulitis and abscess of  the left leg.  The cellulitis and abscess have resolved and the wound is healed.  The patient is now off antibiotics.  The patient is being followed for diabetes and hypertension.  Noted home he states his blood pressures in the 140/80 range.  His blood sugars have been in the 1 20-1 40 range fasting and then get up to mid 200s postprandial  He has not been compliant with his diet.  Note today's hemoglobin A1c is 11 and his CBG is 260  The patient tends to eat a lot in different times and does not take snacks that are healthy in for him between meals but instead eats unhealthy snacks  01/12/2020 The patient returns today for follow-up and continues to run high sugars although not as high as previous visits.  His fastings were in the 200 range postprandial 300.  Previously he was fasting in the 450 range.  He is not been able to access his NovoLog due to insurance barriers.  He is on the Lantus at 55 units daily and is on Metformin at 1000 mg twice daily as well now is on Ozempic 0.5 weekly  Blood pressure showing some improved control  We have been giving the patient counseling with regards to his diet   There are no other new complaints   03/29/2020 This patient is seen as a work in visit as he had gone to the ER recently with hyperglycemia again.  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  Initially was working the first shift going in at 4:30 in the morning getting off at 2:30 in the afternoon in the situation he would skip his breakfast.  At other times he is shifted over to an afternoon shift going into work at 2:30 PM getting off at 10:30 PM in that circumstance he found himself skipping dinner or going out and having fast food.  He states normally his blood sugars are 120 fasting and 180 after eating more recently they have been increasing since eating more fast food and skipping meals  He is  to be on Lantus 15 units daily Ozempic 1 injection weekly and NovoLog 11 units 3 times a day with meals but he has been skipping meals  05/03/2020 Patient seen today in return follow-up blood sugars have been improved he typically runs in the 160s in the middle of the day preprandial 180s postprandial sometimes overnight he will run in the 160s as well.  Very few blood glucoses are over 200 these days compared to prior.  He did receive his first Covid vaccine which was a Estate manager/land agent vaccine recently.  He has no new complaints at this time.  He is working at [employer] in [employer] and works from 3-11  His dietary habits have improved however he occasionally skips dinner The patient's diabetes protocol is as below however he occasionally skips the evening dose of NovoLog when he does not eat dinner Increase lantus to 20units daily Increase ozempic to 28m weekly Cont tid novolog 11 units with meals Blood pressure has been trending up and today is 147/107 on arrival he is on the amlodipine 10 mg a day losartan 100 mg a day and HCTZ 12.5 daily  The patient does struggle with poor dentition and has carious teeth in the molar distribution in his right lower jaw that need removal  06/30/2020 This patient is seen today in the mobile health clinic as a walk-in.  He works in a wPhysiological scientistand extreme temperatures.  He had an episode of syncope yesterday.  His blood sugar has been running in 3-400 range recently.  When I ask him what type of diet he has these been eating a lot of peanut butter sandwiches and smoothies with high sugar content.  He has also been skipping lunch.  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 The patient states he has been compliant with his insulin but has not been taking the midday insulin dose. On arrival the patient's blood  sugar was 417 after  10 units of regular NovoLog it was down to 410.  The patient states he has been trying to keep up with drinking plenty of water while on the job.   Note on arrival also his blood pressure is 153/99 The patient brings with him a job restriction accommodation form to be filled out and he was asked by his employer to take a week off  to improve his health status.  07/04/2020 This patient is seen as a work in when he went to the emergency room yesterday spent 9 hours and then left that being seen.  At that time lab data did show hyponatremia blood sugar of 417 significant volume depletion negative troponin negative EKG otherwise unremarkable  lab data  He states this morning his blood sugars were in the upper 200s on arrival today is 383.  His girlfriend states he is not been eating meals on a regular basis and drinking beer he states he is eating meals somehow he is not being transparent with our team on this information.  09/22/2020 Is a 34 year old male seen in follow-up for type 2 diabetes poorly controlled.  This patient is improving with his glycemic control.  He has more values in the 100 range.  On arrival today is 194.  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  Patient on arrival his blood pressure 130/89.  Patient is try to be more compliant with exercise and his diet.  Patient is fully vaccinated with Covid.  He does need hepatitis C screen. Patient is yet to see a dentist he has numerous carious teeth poor dentition and periodontal disease  11/07/20: Phone visit T2DM.   This patient is seen by way of a telephone visit as he was not able to connect through the video.  Patient now has a new position no longer working for [employer].  He is  seeking a letter stating he was not able to go back in the warehouse to work.  He was helping get a position in the lab but he was not able to achieve that.  Patient notes recently his a.m. blood sugars are 130 and afternoon at 4 PM 110 and with this occasionally gets a little dizziness.  He now has a sensor and can check his blood glucose almost any time.  His weight is at 238 pounds.  He does not have a blood pressure monitor yet available.  He is yet to receive a flu vaccine.  The patient has received his Covid vaccines and is now due a booster  there are no other complaints  The patient states he now has a new job and he works for [employer] as a Geophysicist/field seismologist and he is doing better with this particular job from a medical perspective  Past Medical History:  Diagnosis Date  . Diabetes mellitus without complication ([place])    type 2   . Hypertension   . Hypertensive urgency 06/07/2019  . Phimosis   . Superficial thrombosis of left lower extremity 06/24/2019     Family History  Problem Relation Age of Onset  . Diabetes Father   . Diabetes Paternal Uncle      Social History   Socioeconomic History  . Marital status: Married    Spouse name: Not on file  . Number of children: Not on file  . Years of education: Not on file  . Highest education level: Not on file  Occupational History  . Not on file  Tobacco Use  . Smoking status: Never Smoker  . Smokeless tobacco: Never Used  Vaping Use  . Vaping Use: Never used  Substance and Sexual Activity  . Alcohol use: No  . Drug use: Not Currently    Types: Marijuana  . Sexual activity: Not on file  Other Topics Concern  . Not on file  Social History Narrative  . Not on file   Social Determinants of Health   Financial Resource Strain: Not on file  Food Insecurity:  Not on file  Transportation Needs: Not on file  Physical Activity: Not on file  Stress: Not on file  Social Connections: Not on file  Intimate Partner  Violence: Not on file     No Known Allergies   Outpatient Medications Prior to Visit  Medication Sig Dispense Refill  . amLODipine (NORVASC) 10 MG tablet Take 1 tablet (10 mg total) by mouth daily. 90 tablet 2  . atorvastatin (LIPITOR) 40 MG tablet Take 1 tablet (40 mg total) by mouth daily. 90 tablet 2  . BD INSULIN SYRINGE U/F 31G X 5/16" 0.3 ML MISC     . Continuous Blood Gluc Receiver (FREESTYLE LIBRE 2 READER) DEVI Use to monitor blood sugar 1 each 6  . Continuous Blood Gluc Sensor (FREESTYLE LIBRE 2 SENSOR) MISC Use to monitor blood sugar 6 each 3  . hydrochlorothiazide (HYDRODIURIL) 25 MG tablet Take 1 tablet (25 mg total) by mouth daily. 90 tablet 3  . insulin NPH Human (NOVOLIN N) 100 UNIT/ML injection Inject 0.3 mLs (30 Units total) into the skin at bedtime. 30 mL 3  . Insulin Pen Needle (TRUEPLUS 5-BEVEL PEN NEEDLES) 31G X 5 MM MISC USE AS DIRECTED FOR LANTUS 100 each 3  . insulin regular (NOVOLIN R) 100 units/mL injection Inject 0.19 mLs (19 Units total) into the skin 3 (three) times daily before meals. And syringes 4/day (Patient taking differently: Inject 19 Units into the skin 3 (three) times daily before meals.) 20 mL 3  . losartan (COZAAR) 100 MG tablet Take 1 tablet (100 mg total) by mouth daily. 90 tablet 1  . metFORMIN (GLUCOPHAGE) 500 MG tablet Take 2 tablets (1,000 mg total) by mouth 2 (two) times daily. 120 tablet 2  . metoprolol succinate (TOPROL-XL) 50 MG 24 hr tablet Take 1 tablet (50 mg total) by mouth daily. Take with or immediately following a meal. 90 tablet 3  . terbinafine (LAMISIL) 250 MG tablet Take 1 tablet (250 mg total) by mouth daily. 90 tablet 0  . blood glucose meter kit and supplies KIT Dispense based on patient and insurance preference. Use up to four times daily as directed. (FOR ICD-9 250.00, 250.01). (Patient not taking: No sig reported) 1 each 0  . ibuprofen (ADVIL) 600 MG tablet Take 1 tablet (600 mg total) by mouth every 8 (eight) hours as needed.  (Patient not taking: No sig reported) 60 tablet 1   No facility-administered medications prior to visit.    Review of Systems Constitutional:   No  weight loss, night sweats,  Fevers, chills, fatigue, lassitude. HEENT:   headaches,  Difficulty swallowing,  Tooth/dental problems,  Sore throat,                No sneezing, itching, ear ache, nasal congestion, post nasal drip,   CV:  No chest pain,  Orthopnea, PND, swelling in lower extremities, anasarca, dizziness, palpitations  GI  No heartburn, indigestion, abdominal pain, nausea, vomiting, diarrhea, change in bowel habits, loss of appetite  Resp: No shortness of breath with exertion or at rest.  No excess mucus, no productive cough,  No non-productive cough,  No coughing up of blood.  No change in color of mucus.  No wheezing.  No chest wall deformity  Skin: no rash or lesions.  GU: no dysuria, change in color of urine, no urgency or frequency.  No flank pain.  MS:  No joint pain or swelling.  No decreased range of motion.  No back pain.  Psych:  No  change in mood or affect. No depression or anxiety.  No memory loss.     Objective:   Physical ExamThere were no vitals taken for this visit. A telephone visit there is no exam  CBC Latest Ref Rng & Units 07/04/2020 05/04/2020 03/22/2020  WBC 4.0 - 10.5 K/uL 10.8(H) 9.4 8.2  Hemoglobin 13.0 - 17.0 g/dL 17.4(H) 16.9 17.1(H)  Hematocrit 39.0 - 52.0 % 50.0 48.8 50.2  Platelets 150 - 400 K/uL 210 208 192   BMP Latest Ref Rng & Units 07/04/2020 06/30/2020 03/22/2020  Glucose 70 - 99 mg/dL 430(H) 415(H) 434(H)  BUN 6 - 20 mg/dL '13 12 15  ' Creatinine 0.61 - 1.24 mg/dL 1.10 0.90 1.00  BUN/Creat Ratio 9 - 20 - 13 -  Sodium 135 - 145 mmol/L 130(L) 137 133(L)  Potassium 3.5 - 5.1 mmol/L 3.9 4.3 4.1  Chloride 98 - 111 mmol/L 96(L) 98 97(L)  CO2 22 - 32 mmol/L '25 22 24  ' Calcium 8.9 - 10.3 mg/dL 9.1 9.6 9.2  Lipid Panel     Component Value Date/Time   CHOL 169 06/24/2019 1052   TRIG 223 (H)  06/24/2019 1052   HDL 35 (L) 06/24/2019 1052   CHOLHDL 4.8 06/24/2019 1052   LDLCALC 89 06/24/2019 1052   Lab Results  Component Value Date   HGBA1C 10.2 (A) 09/01/2020   cbg 194     Assessment & Plan:  I personally reviewed all images and lab data in the [facility] system as well as any outside material available during this office visit and agree with the  radiology impressions.   Type 2 diabetes mellitus with hyperglycemia, with long-term current use of insulin ([place]) Per report patient is doing better on the Novulin N/R  insulin that Dr. 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  Diagnoses and all orders for this visit:  Type 2 diabetes mellitus with hyperglycemia, with long-term current use of insulin ([place])  Hyperlipidemia associated with type 2 diabetes mellitus ([place])  Essential hypertension    Follow Up Instructions: Patient knows an in person exam will be scheduled in January and he will receive a letter indicating he needed to been out of work and cannot work in [employer]   I discussed the assessment and treatment plan with the patient. The patient was provided an opportunity to ask questions and all were answered. The patient agreed with the plan and demonstrated an understanding of the instructions.   The patient was advised to call back or seek an in-person evaluation if the symptoms worsen or if the condition fails  to improve as anticipated.  I provided 30 minutes of non-face-to-face time during this encounter  including  median intraservice time , review of notes, labs, imaging, medications  and explaining diagnosis and management to the patient .    Asencion Noble, MD

## 2020-11-07 ENCOUNTER — Telehealth: Payer: Self-pay | Admitting: Critical Care Medicine

## 2020-11-07 ENCOUNTER — Encounter: Payer: Self-pay | Admitting: Critical Care Medicine

## 2020-11-07 ENCOUNTER — Other Ambulatory Visit: Payer: Self-pay

## 2020-11-07 ENCOUNTER — Ambulatory Visit: Payer: BC Managed Care – PPO | Attending: Critical Care Medicine | Admitting: Critical Care Medicine

## 2020-11-07 DIAGNOSIS — I10 Essential (primary) hypertension: Secondary | ICD-10-CM | POA: Diagnosis not present

## 2020-11-07 DIAGNOSIS — Z794 Long term (current) use of insulin: Secondary | ICD-10-CM

## 2020-11-07 DIAGNOSIS — E1169 Type 2 diabetes mellitus with other specified complication: Secondary | ICD-10-CM

## 2020-11-07 DIAGNOSIS — E1165 Type 2 diabetes mellitus with hyperglycemia: Secondary | ICD-10-CM | POA: Diagnosis not present

## 2020-11-07 DIAGNOSIS — E785 Hyperlipidemia, unspecified: Secondary | ICD-10-CM

## 2020-11-07 NOTE — Telephone Encounter (Signed)
Incoming fax for disability. Disability received and placed and PCP box

## 2020-11-07 NOTE — Assessment & Plan Note (Signed)
Per report patient is doing better on the Novulin N/R  insulin that Dr. 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

## 2020-11-07 NOTE — Assessment & Plan Note (Signed)
-  Continue Lipitor °

## 2020-11-07 NOTE — Assessment & Plan Note (Signed)
I advised patient to purchase a blood pressure meter he said he will do so no change in blood pressure medications for now

## 2020-11-08 ENCOUNTER — Ambulatory Visit: Payer: BC Managed Care – PPO | Attending: Critical Care Medicine

## 2020-11-08 ENCOUNTER — Other Ambulatory Visit: Payer: Self-pay

## 2020-11-08 DIAGNOSIS — Z23 Encounter for immunization: Secondary | ICD-10-CM

## 2020-11-08 NOTE — Progress Notes (Signed)
Pt arrived at clinic and received flu vaccine today in left deltoid, pt tolerated shot well.

## 2020-11-22 ENCOUNTER — Ambulatory Visit: Payer: BC Managed Care – PPO | Admitting: Critical Care Medicine

## 2020-11-27 ENCOUNTER — Other Ambulatory Visit: Payer: Self-pay

## 2020-11-27 ENCOUNTER — Encounter (HOSPITAL_COMMUNITY): Payer: Self-pay | Admitting: Emergency Medicine

## 2020-11-27 ENCOUNTER — Emergency Department (HOSPITAL_COMMUNITY)
Admission: EM | Admit: 2020-11-27 | Discharge: 2020-11-27 | Disposition: A | Discharge status: Home / Self Care | Payer: BC Managed Care – PPO | Attending: Emergency Medicine | Admitting: Emergency Medicine

## 2020-11-27 DIAGNOSIS — E1165 Type 2 diabetes mellitus with hyperglycemia: Secondary | ICD-10-CM | POA: Insufficient documentation

## 2020-11-27 DIAGNOSIS — Z79899 Other long term (current) drug therapy: Secondary | ICD-10-CM | POA: Insufficient documentation

## 2020-11-27 DIAGNOSIS — Z794 Long term (current) use of insulin: Secondary | ICD-10-CM | POA: Diagnosis not present

## 2020-11-27 DIAGNOSIS — U071 COVID-19: Secondary | ICD-10-CM | POA: Diagnosis not present

## 2020-11-27 DIAGNOSIS — I10 Essential (primary) hypertension: Secondary | ICD-10-CM | POA: Diagnosis not present

## 2020-11-27 DIAGNOSIS — Z7984 Long term (current) use of oral hypoglycemic drugs: Secondary | ICD-10-CM | POA: Diagnosis not present

## 2020-11-27 DIAGNOSIS — R739 Hyperglycemia, unspecified: Secondary | ICD-10-CM

## 2020-11-27 DIAGNOSIS — R509 Fever, unspecified: Secondary | ICD-10-CM | POA: Diagnosis not present

## 2020-11-27 LAB — BASIC METABOLIC PANEL
Anion gap: 9 (ref 5–15)
BUN: 12 mg/dL (ref 6–20)
CO2: 25 mmol/L (ref 22–32)
Calcium: 9.4 mg/dL (ref 8.9–10.3)
Chloride: 103 mmol/L (ref 98–111)
Creatinine, Ser: 0.98 mg/dL (ref 0.61–1.24)
GFR, Estimated: >60 mL/min (ref >60)
Glucose, Bld: 290 mg/dL — ABNORMAL HIGH (ref 70–99)
Potassium: 4.4 mmol/L (ref 3.5–5.1)
Sodium: 137 mmol/L (ref 135–145)

## 2020-11-27 LAB — CBC WITH DIFFERENTIAL/PLATELET
Abs Immature Granulocytes: 0.06 10*3/uL (ref 0.00–0.07)
Basophils Absolute: 0 10*3/uL (ref 0.0–0.1)
Basophils Relative: 0 %
Eosinophils Absolute: 0.1 10*3/uL (ref 0.0–0.5)
Eosinophils Relative: 1 %
HCT: 52.2 % — ABNORMAL HIGH (ref 39.0–52.0)
Hemoglobin: 17.9 g/dL — ABNORMAL HIGH (ref 13.0–17.0)
Immature Granulocytes: 1 %
Lymphocytes Relative: 17 %
Lymphs Abs: 1.8 10*3/uL (ref 0.7–4.0)
MCH: 29.1 pg (ref 26.0–34.0)
MCHC: 34.3 g/dL (ref 30.0–36.0)
MCV: 84.9 fL (ref 80.0–100.0)
Monocytes Absolute: 0.8 10*3/uL (ref 0.1–1.0)
Monocytes Relative: 7 %
Neutro Abs: 7.7 10*3/uL (ref 1.7–7.7)
Neutrophils Relative %: 74 %
Platelets: 210 10*3/uL (ref 150–400)
RBC: 6.15 MIL/uL — ABNORMAL HIGH (ref 4.22–5.81)
RDW: 12.3 % (ref 11.5–15.5)
WBC: 10.4 10*3/uL (ref 4.0–10.5)
nRBC: 0 % (ref 0.0–0.2)

## 2020-11-27 LAB — RESP PANEL BY RT-PCR (FLU A&B, COVID) ARPGX2
Influenza A by PCR: NEGATIVE
Influenza B by PCR: NEGATIVE
SARS Coronavirus 2 by RT PCR: POSITIVE — AB

## 2020-11-27 LAB — BLOOD GAS, VENOUS
Acid-Base Excess: 2.5 mmol/L — ABNORMAL HIGH (ref 0.0–2.0)
Bicarbonate: 27.6 mmol/L (ref 20.0–28.0)
O2 Saturation: 58.5 %
Patient temperature: 98.6
pCO2, Ven: 45.7 mmHg (ref 44.0–60.0)
pH, Ven: 7.398 (ref 7.250–7.430)
pO2, Ven: 33.3 mmHg (ref 32.0–45.0)

## 2020-11-27 LAB — CBG MONITORING, ED
Glucose-Capillary: 300 mg/dL — ABNORMAL HIGH (ref 70–99)
Glucose-Capillary: 258 mg/dL — ABNORMAL HIGH (ref 70–99)

## 2020-11-27 MED ORDER — FLUTICASONE PROPIONATE 50 MCG/ACT NA SUSP
2.0000 | Freq: Every day | NASAL | 0 refills | Status: DC
Start: 2020-11-27 — End: 2021-10-23

## 2020-11-27 MED ORDER — CETIRIZINE HCL 10 MG PO TABS
10.0000 mg | ORAL_TABLET | Freq: Every day | ORAL | 0 refills | Status: DC
Start: 2020-11-27 — End: 2020-12-26

## 2020-11-27 MED ORDER — SODIUM CHLORIDE 0.9 % IV BOLUS
1000.0000 mL | Freq: Once | INTRAVENOUS | Status: AC
Start: 2020-11-27 — End: 2020-11-27
  Administered 2020-11-27: 1000 mL via INTRAVENOUS

## 2020-11-27 MED ORDER — BENZONATATE 100 MG PO CAPS
100.0000 mg | ORAL_CAPSULE | Freq: Three times a day (TID) | ORAL | 0 refills | Status: DC
Start: 2020-11-27 — End: 2020-12-26

## 2020-11-27 MED ORDER — INSULIN ASPART 100 UNIT/ML ~~LOC~~ SOLN
4.0000 [IU] | Freq: Once | SUBCUTANEOUS | Status: AC
  Administered 2020-11-27: 4 [IU] via SUBCUTANEOUS
  Filled 2020-11-27: qty 0.04

## 2020-11-27 MED ORDER — ALBUTEROL SULFATE HFA 108 (90 BASE) MCG/ACT IN AERS: 1.0000 | INHALATION_SPRAY | Freq: Four times a day (QID) | RESPIRATORY_TRACT | 0 refills | Status: DC | PRN

## 2020-11-27 NOTE — ED Notes (Signed)
Pt requesting work note

## 2020-11-27 NOTE — ED Provider Notes (Addendum)
[facility] DEPT Provider Note   CSN: [id number] Arrival date & time: 11/27/20  1114    History COVID   Anthony Hubbard is a 35 y.o. male with past medical history significant for DM, HTN who presents for evaluation of Covid exposure.  He is vaccinated against Covid.  He has been having fever, chills, cough, myalgias.  His blood sugars have been "good" at home.  He denies headache, lightness, dizziness, chest pain, shortness of breath, abdominal pain, diarrhea, dysuria.  Did have a "scratchy throat" 2 days ago.  Is not been taking anything for symptoms.  Denies additional aggravating or alleviating factors.  Patient obtained from patient and past medical records. No interpretor was used.  HPI     Past Medical History:  Diagnosis Date  . Diabetes mellitus without complication ([place])    type 2   . Hypertension   . Hypertensive urgency 06/07/2019  . Phimosis   . Superficial thrombosis of left lower extremity 06/24/2019    Patient Active Problem List   Diagnosis Date Noted  . Dental caries 05/03/2020  . Toenail fungus 07/15/2019  . Bilateral bunions 07/15/2019  . Type 2 diabetes mellitus with hyperglycemia, with long-term current use of insulin ([place]) 06/07/2019  . Essential hypertension 06/07/2019  . Hypertriglyceridemia 11/20/2017  . Hyperlipidemia associated with type 2 diabetes mellitus ([place]) 06/12/2017    Past Surgical History:  Procedure Laterality Date  . CIRCUMCISION    . I & D EXTREMITY Left 06/10/2019   Procedure: IRRIGATION AND DEBRIDEMENT LEFT LOWER EXTREMITY;  Surgeon: Leandrew Koyanagi, MD;  Location: [facility];  Service: Orthopedics;  Laterality: Left;  . TONSILLECTOMY         Family History  Problem Relation Age of Onset  . Diabetes Father   . Diabetes Paternal Uncle     Social History   Tobacco Use  . Smoking status: Never Smoker  . Smokeless tobacco: Never Used  Vaping Use  . Vaping Use: Never used  Substance Use Topics   . Alcohol use: No  . Drug use: Not Currently    Types: Marijuana    Home Medications Prior to Admission medications   Medication Sig Start Date End Date Taking? Authorizing Provider  albuterol (VENTOLIN HFA) 108 (90 Base) MCG/ACT inhaler Inhale 1-2 puffs into the lungs every 6 (six) hours as needed for wheezing or shortness of breath. 11/27/20  Yes Kasarah Sitts A, PA-C  benzonatate (TESSALON) 100 MG capsule Take 1 capsule (100 mg total) by mouth every 8 (eight) hours. 11/27/20  Yes Joelly Bolanos A, PA-C  cetirizine (ZYRTEC ALLERGY) 10 MG tablet Take 1 tablet (10 mg total) by mouth daily. 11/27/20  Yes Amogh Komatsu A, PA-C  fluticasone (FLONASE) 50 MCG/ACT nasal spray Place 2 sprays into both nostrils daily. 11/27/20  Yes Nazair Fortenberry A, PA-C  amLODipine (NORVASC) 10 MG tablet Take 1 tablet (10 mg total) by mouth daily. 08/08/20   Elsie Stain, MD  atorvastatin (LIPITOR) 40 MG tablet Take 1 tablet (40 mg total) by mouth daily. 08/08/20   Elsie Stain, MD  BD INSULIN SYRINGE U/F 31G X 5/16" 0.3 ML MISC  09/11/20   [provider]  blood glucose meter kit and supplies KIT Dispense based on patient and insurance preference. Use up to four times daily as directed. (FOR ICD-9 250.00, 250.01). Patient not taking: No sig reported 06/15/19   Kathie Dike, MD  Continuous Blood Gluc Receiver (FREESTYLE LIBRE 2 READER) DEVI Use to monitor blood  sugar 03/29/20   Elsie Stain, MD  Continuous Blood Gluc Sensor (FREESTYLE LIBRE 2 SENSOR) MISC Use to monitor blood sugar 09/01/20   Renato Shin, MD  hydrochlorothiazide (HYDRODIURIL) 25 MG tablet Take 1 tablet (25 mg total) by mouth daily. 05/03/20   Elsie Stain, MD  ibuprofen (ADVIL) 600 MG tablet Take 1 tablet (600 mg total) by mouth every 8 (eight) hours as needed. Patient not taking: No sig reported 07/15/19   Elsie Stain, MD  insulin NPH Human (NOVOLIN N) 100 UNIT/ML injection Inject 0.3 mLs (30 Units total) into the  skin at bedtime. 09/01/20   Renato Shin, MD  Insulin Pen Needle (TRUEPLUS 5-BEVEL PEN NEEDLES) 31G X 5 MM MISC USE AS DIRECTED FOR LANTUS 08/08/20   Elsie Stain, MD  insulin regular (NOVOLIN R) 100 units/mL injection Inject 0.19 mLs (19 Units total) into the skin 3 (three) times daily before meals. And syringes 4/day Patient taking differently: Inject 19 Units into the skin 3 (three) times daily before meals. 09/01/20   Renato Shin, MD  losartan (COZAAR) 100 MG tablet Take 1 tablet (100 mg total) by mouth daily. 08/08/20   Elsie Stain, MD  metFORMIN (GLUCOPHAGE) 500 MG tablet Take 2 tablets (1,000 mg total) by mouth 2 (two) times daily. 08/08/20   Elsie Stain, MD  metoprolol succinate (TOPROL-XL) 50 MG 24 hr tablet Take 1 tablet (50 mg total) by mouth daily. Take with or immediately following a meal. 06/30/20   Elsie Stain, MD  terbinafine (LAMISIL) 250 MG tablet Take 1 tablet (250 mg total) by mouth daily. 08/08/20   Elsie Stain, MD    Allergies    Patient has no known allergies.  Review of Systems   Review of Systems  Constitutional: Positive for activity change, appetite change, chills, fatigue and fever.  HENT: Positive for congestion and rhinorrhea.   Respiratory: Positive for cough. Negative for apnea, choking, chest tightness, shortness of breath, wheezing and stridor.   Cardiovascular: Negative.   Gastrointestinal: Negative.   Genitourinary: Negative.   Musculoskeletal: Positive for myalgias. Negative for arthralgias, back pain, gait problem, joint swelling, neck pain and neck stiffness.  Skin: Negative.   Neurological: Negative.   All other systems reviewed and are negative.  Physical Exam Updated Vital Signs BP (!) 169/118 (BP Location: Left Arm)   Pulse 89   Temp 99.1 F (37.3 C) (Oral)   Resp 20   Ht _0  (1.753 m)   Wt 106.6 kg   SpO2 95%   BMI 34.70 kg/m   Physical Exam Vitals and nursing note reviewed.  Constitutional:      General:  He is not in acute distress.    Appearance: He is not ill-appearing, toxic-appearing or diaphoretic.  HENT:     Head: Normocephalic and atraumatic.     Jaw: There is normal jaw occlusion.     Right Ear: Tympanic membrane, ear canal and external ear normal. There is no impacted cerumen. No hemotympanum. Tympanic membrane is not injected, scarred, perforated, erythematous, retracted or bulging.     Left Ear: Tympanic membrane, ear canal and external ear normal. There is no impacted cerumen. No hemotympanum. Tympanic membrane is not injected, scarred, perforated, erythematous, retracted or bulging.     Ears:     Comments: No Mastoid tenderness.    Nose:     Comments: Clear rhinorrhea and congestion to bilateral nares.  No sinus tenderness.    Mouth/Throat:  Comments: Posterior oropharynx clear.  Mucous membranes moist.  Tonsils without erythema or exudate.  Uvula midline without deviation.  No evidence of PTA or RPA.  No drooling, dysphasia or trismus.  Phonation normal. Neck:     Trachea: Trachea and phonation normal.     Meningeal: Brudzinski's sign and Kernig's sign absent.     Comments: No Neck stiffness or neck rigidity.  No meningismus.  No cervical lymphadenopathy. Cardiovascular:     Comments: No murmurs rubs or gallops. Pulmonary:     Comments: Clear to auscultation bilaterally without wheeze, rhonchi or rales.  No accessory muscle usage.  Able speak in full sentences. Abdominal:     Comments: Soft, nontender without rebound or guarding.  No CVA tenderness.  Musculoskeletal:     Comments: Moves all 4 extremities without difficulty.  Lower extremities without edema, erythema or warmth.  Skin:    Comments: Brisk capillary refill.  No rashes or lesions.  Neurological:     Mental Status: He is alert.     Comments: Ambulatory in department without difficulty.  Cranial nerves II through XII grossly intact.  No facial droop.  No aphasia.    ED Results / Procedures / Treatments    Labs (all labs ordered are listed, but only abnormal results are displayed) Labs Reviewed  RESP PANEL BY RT-PCR (FLU A&B, COVID) ARPGX2 - Abnormal; Notable for the following components:      Result Value   SARS Coronavirus 2 by RT PCR POSITIVE (*)    All other components within normal limits  CBC WITH DIFFERENTIAL/PLATELET - Abnormal; Notable for the following components:   RBC 6.15 (*)    Hemoglobin 17.9 (*)    HCT 52.2 (*)    All other components within normal limits  BASIC METABOLIC PANEL - Abnormal; Notable for the following components:   Glucose, Bld 290 (*)    All other components within normal limits  BLOOD GAS, VENOUS - Abnormal; Notable for the following components:   Acid-Base Excess 2.5 (*)    All other components within normal limits  CBG MONITORING, ED - Abnormal; Notable for the following components:   Glucose-Capillary 300 (*)    All other components within normal limits  CBG MONITORING, ED - Abnormal; Notable for the following components:   Glucose-Capillary 258 (*)    All other components within normal limits  URINALYSIS, ROUTINE W REFLEX MICROSCOPIC    EKG None  Radiology No results found.  Procedures Procedures (including critical care time)  Medications Ordered in ED Medications  sodium chloride 0.9 % bolus 1,000 mL (1,000 mLs Intravenous New Bag/Given 11/27/20 1735)  insulin aspart (novoLOG) injection 4 Units (4 Units Subcutaneous Given 11/27/20 1737)    ED Course  I have reviewed the triage vital signs and the nursing notes.  Pertinent labs & imaging results that were available during my care of the patient were reviewed by me and considered in my medical decision making (see chart for details).  35 year old presents for evaluation evaluation of Covid symptoms.  He is vaccinated.  Recent exposure to coworker.  On arrival he is afebrile, non septic, non ill appearing.  Heart and lungs are clear.  Abdomen soft, nontender.  He has a nonfocal neuro exam  without deficits.  Without tachypnea, tachypnea or hypoxia.  Appears overall well.  He is ambulatory in room with oxygen saturation greater 90% on room air.  No dyspnea on exertion.  No neck stiffness or neck rigidity.  No unilateral leg  swelling, redness or warmth.  Has clinical evidence of DVT on exam.  Patient was dc in stable condition. Upon discharge nursing now states that patient is complaining of feeling like his blood sugar is elevated, lightheaded and dizzy. Nursing to check CBG.  CBG 300, will check basic labs, IVF, Insulin and reevaluate   Labs and imaging personally viewed interpreted CBC without leukocytosis hemoglobin 17.9, suspect hemoconcentration Metabolic panel with mild hyperglycemia to 290, no elevated anion gap, no additional electrolyte, renal or liver abnormality VBG without acidosis, normal bicarb  CBG downtrending  Reassessed patient.  He feels better after IV fluids.  Suspect some dehydration as cause of his lightheadedness.  He has negative orthostatic vital signs.  Will DC home with symptomatic management.  Discussed close monitoring of blood sugars at home given recent Covid diagnosis.  He is agreeable for this     The patient has been appropriately medically screened and/or stabilized in the ED. I have low suspicion for any other emergent medical condition which would require further screening, evaluation or treatment in the ED or require inpatient management.  Patient is hemodynamically stable and in no acute distress.  Patient able to ambulate in department prior to ED.  Evaluation does not show acute pathology that would require ongoing or additional emergent interventions while in the emergency department or further inpatient treatment.  I have discussed the diagnosis with the patient and answered all questions.  Pain is been managed while in the emergency department and patient has no further complaints prior to discharge.  Patient is comfortable with plan  discussed in room and is stable for discharge at this time.  I have discussed strict return precautions for returning to the emergency department.  Patient was encouraged to follow-up with PCP/specialist refer to at discharge.    MDM Rules/Calculators/A&P                          Keneth Borg was evaluated in Emergency Department on 11/27/2020 for the symptoms described in the history of present illness. He was evaluated in the context of the global COVID-19 pandemic, which necessitated consideration that the patient might be at risk for infection with the SARS-CoV-2 virus that causes COVID-19. Institutional protocols and algorithms that pertain to the evaluation of patients at risk for COVID-19 are in a state of rapid change based on information released by regulatory bodies including the CDC and federal and state organizations. These policies and algorithms were followed during the patient's care in the ED. Final Clinical Impression(s) / ED Diagnoses Final diagnoses:  COVID  Hyperglycemia    Rx / DC Orders ED Discharge Orders         Ordered    benzonatate (TESSALON) 100 MG capsule  Every 8 hours        11/27/20 1537    albuterol (VENTOLIN HFA) 108 (90 Base) MCG/ACT inhaler  Every 6 hours PRN        11/27/20 1537    fluticasone (FLONASE) 50 MCG/ACT nasal spray  Daily        11/27/20 1537    cetirizine (ZYRTEC ALLERGY) 10 MG tablet  Daily        11/27/20 1537           Tynetta Bachmann A, PA-C 11/27/20 1838    Tamara Monteith A, PA-C 11/27/20 1842    Pattricia Boss, MD 11/27/20 1950

## 2020-11-27 NOTE — Discharge Instructions (Signed)
Return for new or worsening symptoms

## 2020-11-27 NOTE — ED Triage Notes (Signed)
Patient has cough, fever, diarrhea, sore throat and generalized body aches x3 days. Was at work earlier today and got informed that his sister was COVID-19 positive, he had seen her for christmas. Got 2 doses of COVID-19 vaccine.

## 2020-12-02 ENCOUNTER — Encounter (HOSPITAL_BASED_OUTPATIENT_CLINIC_OR_DEPARTMENT_OTHER): Payer: Self-pay | Admitting: Emergency Medicine

## 2020-12-02 ENCOUNTER — Other Ambulatory Visit: Payer: Self-pay

## 2020-12-02 ENCOUNTER — Emergency Department (HOSPITAL_BASED_OUTPATIENT_CLINIC_OR_DEPARTMENT_OTHER): Payer: BC Managed Care – PPO

## 2020-12-02 ENCOUNTER — Emergency Department (HOSPITAL_BASED_OUTPATIENT_CLINIC_OR_DEPARTMENT_OTHER)
Admission: EM | Admit: 2020-12-02 | Discharge: 2020-12-02 | Disposition: A | Discharge status: Home / Self Care | Payer: BC Managed Care – PPO | Attending: Emergency Medicine | Admitting: Emergency Medicine

## 2020-12-02 DIAGNOSIS — Z79899 Other long term (current) drug therapy: Secondary | ICD-10-CM | POA: Insufficient documentation

## 2020-12-02 DIAGNOSIS — R739 Hyperglycemia, unspecified: Secondary | ICD-10-CM

## 2020-12-02 DIAGNOSIS — R0602 Shortness of breath: Secondary | ICD-10-CM | POA: Diagnosis not present

## 2020-12-02 DIAGNOSIS — Z7984 Long term (current) use of oral hypoglycemic drugs: Secondary | ICD-10-CM | POA: Diagnosis not present

## 2020-12-02 DIAGNOSIS — R059 Cough, unspecified: Secondary | ICD-10-CM | POA: Diagnosis not present

## 2020-12-02 DIAGNOSIS — U071 COVID-19: Secondary | ICD-10-CM | POA: Insufficient documentation

## 2020-12-02 DIAGNOSIS — E1165 Type 2 diabetes mellitus with hyperglycemia: Secondary | ICD-10-CM | POA: Diagnosis not present

## 2020-12-02 DIAGNOSIS — I10 Essential (primary) hypertension: Secondary | ICD-10-CM | POA: Diagnosis not present

## 2020-12-02 DIAGNOSIS — Z794 Long term (current) use of insulin: Secondary | ICD-10-CM | POA: Insufficient documentation

## 2020-12-02 DIAGNOSIS — R000 Tachycardia, unspecified: Secondary | ICD-10-CM | POA: Diagnosis not present

## 2020-12-02 LAB — CBC
HCT: 48.5 % (ref 39.0–52.0)
Hemoglobin: 16.9 g/dL (ref 13.0–17.0)
MCH: 29.3 pg (ref 26.0–34.0)
MCHC: 34.8 g/dL (ref 30.0–36.0)
MCV: 84.1 fL (ref 80.0–100.0)
Platelets: 119 10*3/uL — ABNORMAL LOW (ref 150–400)
RBC: 5.77 MIL/uL (ref 4.22–5.81)
RDW: 12.2 % (ref 11.5–15.5)
WBC: 5.9 10*3/uL (ref 4.0–10.5)
nRBC: 0 % (ref 0.0–0.2)

## 2020-12-02 LAB — BASIC METABOLIC PANEL
Anion gap: 11 (ref 5–15)
BUN: 14 mg/dL (ref 6–20)
CO2: 27 mmol/L (ref 22–32)
Calcium: 9.1 mg/dL (ref 8.9–10.3)
Chloride: 95 mmol/L — ABNORMAL LOW (ref 98–111)
Creatinine, Ser: 1.07 mg/dL (ref 0.61–1.24)
GFR, Estimated: >60 mL/min (ref >60)
Glucose, Bld: 380 mg/dL — ABNORMAL HIGH (ref 70–99)
Potassium: 3.9 mmol/L (ref 3.5–5.1)
Sodium: 133 mmol/L — ABNORMAL LOW (ref 135–145)

## 2020-12-02 LAB — CBG MONITORING, ED: Glucose-Capillary: 384 mg/dL — ABNORMAL HIGH (ref 70–99)

## 2020-12-02 MED ORDER — ALBUTEROL SULFATE HFA 108 (90 BASE) MCG/ACT IN AERS: 2.0000 | INHALATION_SPRAY | RESPIRATORY_TRACT | Status: DC | PRN

## 2020-12-02 MED ORDER — SODIUM CHLORIDE 0.9 % IV BOLUS
1000.0000 mL | Freq: Once | INTRAVENOUS | Status: AC
  Administered 2020-12-02: 1000 mL via INTRAVENOUS

## 2020-12-02 NOTE — ED Provider Notes (Signed)
Care handoff received from Renne Crigler PA-C at shift change, please see previous provider note for full details of visit.  In short 35 year old male insulin-dependent diabetic with history of hypertension.  He presented for continued cough 8 days into Covid 19 viral infection.  Additionally had some vomiting and infrequent diarrhea.  I have been asked to follow-up from patient's chest x-ray and reassess the patient after he receives IV fluids.  Anticipate discharge.  Physical Exam  BP (!) 152/111 (BP Location: Right Arm)   Pulse 94   Temp 98.5 F (36.9 C) (Oral)   Resp 20   Ht 5\' 10"  (1.778 m)   Wt 102.1 kg   SpO2 99%   BMI 32.28 kg/m   Physical Exam Constitutional:      General: He is not in acute distress.    Appearance: Normal appearance. He is well-developed. He is not ill-appearing or diaphoretic.  HENT:     Head: Normocephalic and atraumatic.  Eyes:     General: Vision grossly intact. Gaze aligned appropriately.     Pupils: Pupils are equal, round, and reactive to light.  Neck:     Trachea: Trachea and phonation normal.  Pulmonary:     Effort: Pulmonary effort is normal. No respiratory distress.  Abdominal:     General: There is no distension.     Palpations: Abdomen is soft.     Tenderness: There is no abdominal tenderness. There is no guarding or rebound.  Musculoskeletal:        General: Normal range of motion.     Cervical back: Normal range of motion.  Skin:    General: Skin is warm and dry.  Neurological:     Mental Status: He is alert.     GCS: GCS eye subscore is 4. GCS verbal subscore is 5. GCS motor subscore is 6.     Comments: Speech is clear and goal oriented, follows commands Major Cranial nerves without deficit, no facial droop Moves extremities without ataxia, coordination intact  Psychiatric:        Behavior: Behavior normal.     ED Course/Procedures     Procedures  MDM  I reviewed and interpreted labs which include: CBC without  leukocytosis to suggest bacterial infection, no anemia. BMP shows mild hyponatremia of 133 mild hypochloremia of 95, this was repleted with fluid bolus.  No emergent electrolyte derangement.  Glucose elevated at 380, no anion gap and normal bicarb.  Not consistent with DKA.  No evidence of AKI.  Chest x-ray:    IMPRESSION:  No acute process in the chest.   EKG: Sinus tachycardia Right superior axis deviation Pulmonary disease pattern Abnormal ECG Confirmed by [phone]) on 12/02/2020 9:59:50 PM - Patient received 1 L fluid bolus.  I reevaluated the patient he is resting comfortably in bed, sleeping easily arousable to voice.  Reports that he is feeling well, vital signs stable.  He would like to be discharged.  He has no complaints or concerns.  I reiterated to the patient the importance of management of his blood sugar levels and compliance with all home medications he stated understanding.  Advised patient maintain his water hydration get plenty of rest and quarantine/isolate to avoid spread of illness.  I advised patient to follow-up with his PCP in the next few days for recheck.   At this time there does not appear to be any evidence of an acute emergency medical condition and the patient appears stable for discharge with appropriate outpatient  follow up. Diagnosis was discussed with patient who verbalizes understanding of care plan and is agreeable to discharge. I have discussed return precautions with patient who verbalizes understanding. Patient encouraged to follow-up with their PCP. All questions answered.   Anthony Hubbard was evaluated in Emergency Department on 12/02/2020 for the symptoms described in the history of present illness. He was evaluated in the context of the global COVID-19 pandemic, which necessitated consideration that the patient might be at risk for infection with the SARS-CoV-2 virus that causes COVID-19. Institutional protocols and algorithms that pertain  to the evaluation of patients at risk for COVID-19 are in a state of rapid change based on information released by regulatory bodies including the CDC and federal and state organizations. These policies and algorithms were followed during the patient's care in the ED.   Note: Portions of this report may have been transcribed using voice recognition software. Every effort was made to ensure accuracy; however, inadvertent computerized transcription errors may still be present.   Elizabeth Palau 12/02/20 2233    Tilden Fossa, MD 12/02/20 [phone]

## 2020-12-02 NOTE — ED Triage Notes (Signed)
Reports being diagnosed with covid positive 5 days ago.  Reports symptoms aren't getting any better. C/o cough, sob, also having elevated BS even using insulin.

## 2020-12-02 NOTE — ED Provider Notes (Signed)
[facility] EMERGENCY DEPARTMENT Provider Note   CSN: [id number] Arrival date & time: 12/02/20  1458     History Chief Complaint  Patient presents with  . Covid Positive    Anthony Hubbard is a 35 y.o. male.  Patient with history of diabetes on insulin, hypertension presents to the emergency department with continued cough and shortness of breath as well as high blood pressure in setting of Covid infection.  Patient is on approximately day 8 of illness.  He was diagnosed here on 11/27/2020.  Patient states that he has had some vomiting at home after eating and drinking.  He has had infrequent episodes of diarrhea.  He was concerned his blood pressure was in the 353G and 992E systolic range even with taking his medications.  This was concerning to him.  He states that his shortness of breath has become worse over the past several days and he has had persistent coughing.  The onset of this condition was acute. The course is constant. Aggravating factors: none. Alleviating factors: none.  Patient was previously vaccinated against COVID-19.         Past Medical History:  Diagnosis Date  . Diabetes mellitus without complication ([place])    type 2   . Hypertension   . Hypertensive urgency 06/07/2019  . Phimosis   . Superficial thrombosis of left lower extremity 06/24/2019    Patient Active Problem List   Diagnosis Date Noted  . Dental caries 05/03/2020  . Toenail fungus 07/15/2019  . Bilateral bunions 07/15/2019  . Type 2 diabetes mellitus with hyperglycemia, with long-term current use of insulin ([place]) 06/07/2019  . Essential hypertension 06/07/2019  . Hypertriglyceridemia 11/20/2017  . Hyperlipidemia associated with type 2 diabetes mellitus ([place]) 06/12/2017    Past Surgical History:  Procedure Laterality Date  . CIRCUMCISION    . I & D EXTREMITY Left 06/10/2019   Procedure: IRRIGATION AND DEBRIDEMENT LEFT LOWER EXTREMITY;  Surgeon: Leandrew Koyanagi, MD;  Location: [facility];   Service: Orthopedics;  Laterality: Left;  . TONSILLECTOMY         Family History  Problem Relation Age of Onset  . Diabetes Father   . Diabetes Paternal Uncle     Social History   Tobacco Use  . Smoking status: Never Smoker  . Smokeless tobacco: Never Used  Vaping Use  . Vaping Use: Never used  Substance Use Topics  . Alcohol use: No  . Drug use: Not Currently    Types: Marijuana    Home Medications Prior to Admission medications   Medication Sig Start Date End Date Taking? Authorizing Provider  albuterol (VENTOLIN HFA) 108 (90 Base) MCG/ACT inhaler Inhale 1-2 puffs into the lungs every 6 (six) hours as needed for wheezing or shortness of breath. 11/27/20   Henderly, Britni A, PA-C  amLODipine (NORVASC) 10 MG tablet Take 1 tablet (10 mg total) by mouth daily. 08/08/20   Elsie Stain, MD  atorvastatin (LIPITOR) 40 MG tablet Take 1 tablet (40 mg total) by mouth daily. 08/08/20   Elsie Stain, MD  BD INSULIN SYRINGE U/F 31G X 5/16" 0.3 ML MISC  09/11/20   [provider]  benzonatate (TESSALON) 100 MG capsule Take 1 capsule (100 mg total) by mouth every 8 (eight) hours. 11/27/20   Henderly, Britni A, PA-C  blood glucose meter kit and supplies KIT Dispense based on patient and insurance preference. Use up to four times daily as directed. (FOR ICD-9 250.00, 250.01). Patient not taking: No  sig reported 06/15/19   Kathie Dike, MD  cetirizine (ZYRTEC ALLERGY) 10 MG tablet Take 1 tablet (10 mg total) by mouth daily. 11/27/20   Henderly, Britni A, PA-C  Continuous Blood Gluc Receiver (FREESTYLE LIBRE 2 READER) DEVI Use to monitor blood sugar 03/29/20   Elsie Stain, MD  Continuous Blood Gluc Sensor (FREESTYLE LIBRE 2 SENSOR) MISC Use to monitor blood sugar 09/01/20   Renato Shin, MD  fluticasone [facility]) 50 MCG/ACT nasal spray Place 2 sprays into both nostrils daily. 11/27/20   Henderly, Britni A, PA-C  hydrochlorothiazide (HYDRODIURIL) 25 MG tablet Take 1 tablet (25 mg  total) by mouth daily. 05/03/20   Elsie Stain, MD  ibuprofen (ADVIL) 600 MG tablet Take 1 tablet (600 mg total) by mouth every 8 (eight) hours as needed. Patient not taking: No sig reported 07/15/19   Elsie Stain, MD  insulin NPH Human (NOVOLIN N) 100 UNIT/ML injection Inject 0.3 mLs (30 Units total) into the skin at bedtime. 09/01/20   Renato Shin, MD  Insulin Pen Needle (TRUEPLUS 5-BEVEL PEN NEEDLES) 31G X 5 MM MISC USE AS DIRECTED FOR LANTUS 08/08/20   Elsie Stain, MD  insulin regular (NOVOLIN R) 100 units/mL injection Inject 0.19 mLs (19 Units total) into the skin 3 (three) times daily before meals. And syringes 4/day Patient taking differently: Inject 19 Units into the skin 3 (three) times daily before meals. 09/01/20   Renato Shin, MD  losartan (COZAAR) 100 MG tablet Take 1 tablet (100 mg total) by mouth daily. 08/08/20   Elsie Stain, MD  metFORMIN (GLUCOPHAGE) 500 MG tablet Take 2 tablets (1,000 mg total) by mouth 2 (two) times daily. 08/08/20   Elsie Stain, MD  metoprolol succinate (TOPROL-XL) 50 MG 24 hr tablet Take 1 tablet (50 mg total) by mouth daily. Take with or immediately following a meal. 06/30/20   Elsie Stain, MD  terbinafine (LAMISIL) 250 MG tablet Take 1 tablet (250 mg total) by mouth daily. 08/08/20   Elsie Stain, MD    Allergies    Patient has no known allergies.  Review of Systems   Review of Systems  Constitutional: Negative for fever.  HENT: Negative for rhinorrhea and sore throat.   Eyes: Negative for redness.  Respiratory: Positive for cough and shortness of breath.   Cardiovascular: Negative for chest pain.  Gastrointestinal: Positive for diarrhea, nausea and vomiting. Negative for abdominal pain.  Genitourinary: Negative for dysuria and hematuria.  Musculoskeletal: Negative for myalgias.  Skin: Negative for rash.  Neurological: Negative for headaches.    Physical Exam Updated Vital Signs BP (!) 146/110 (BP Location: Left  Arm)   Pulse 98   Temp 98.5 F (36.9 C) (Oral)   Resp 16   Ht _0  (1.778 m)   Wt 102.1 kg   SpO2 97%   BMI 32.28 kg/m   Physical Exam Vitals and nursing note reviewed.  Constitutional:      Appearance: He is well-developed and well-nourished.  HENT:     Head: Normocephalic and atraumatic.     Nose: Nose normal.     Mouth/Throat:     Mouth: Mucous membranes are moist.  Eyes:     General:        Right eye: No discharge.        Left eye: No discharge.     Conjunctiva/sclera: Conjunctivae normal.  Cardiovascular:     Rate and Rhythm: Regular rhythm. Tachycardia present.     Heart  sounds: Normal heart sounds.     Comments: Mild tachycardia Pulmonary:     Effort: Pulmonary effort is normal.     Breath sounds: Normal breath sounds.  Abdominal:     Palpations: Abdomen is soft.     Tenderness: There is no abdominal tenderness.  Musculoskeletal:     Cervical back: Normal range of motion and neck supple.  Skin:    General: Skin is warm and dry.  Neurological:     Mental Status: He is alert.  Psychiatric:        Mood and Affect: Mood and affect normal.     ED Results / Procedures / Treatments   Labs (all labs ordered are listed, but only abnormal results are displayed) Labs Reviewed  BASIC METABOLIC PANEL - Abnormal; Notable for the following components:      Result Value   Sodium 133 (*)    Chloride 95 (*)    Glucose, Bld 380 (*)    All other components within normal limits  CBC - Abnormal; Notable for the following components:   Platelets 119 (*)    All other components within normal limits  CBG MONITORING, ED - Abnormal; Notable for the following components:   Glucose-Capillary 384 (*)    All other components within normal limits    EKG None  Radiology No results found.  Procedures Procedures (including critical care time)  Medications Ordered in ED Medications  albuterol (VENTOLIN HFA) 108 (90 Base) MCG/ACT inhaler 2 puff (has no administration in  time range)  sodium chloride 0.9 % bolus 1,000 mL (has no administration in time range)    ED Course  I have reviewed the triage vital signs and the nursing notes.  Pertinent labs & imaging results that were available during my care of the patient were reviewed by me and considered in my medical decision making (see chart for details).  Patient seen and examined.  Patient appears like he is feeling poorly however is not in any distress.  His normal oxygen saturation makes me optimistic that he will be able to go home today and do well.  He has been addressing his diabetes by increasing his insulin slightly at home.  He seems like he is on top of his diabetes management.  He continues to have elevated blood sugars likely related to his infection.  We discussed blood pressure management and his elevated readings are likely temporary related to his infection and feeling poorly.  Discussed that he should have these rechecked once he is feeling better and recovers and follow-up with his doctor if they continue to be high.  He seems reassured about his blood pressure.  Given reported vomiting and his history of diabetes, I would like to give him a fluid bolus and check a chest x-ray to evaluate the extent of pneumonia.  If he continues to do well, anticipate discharged home after fluid bolus.  Vital signs reviewed and are as follows: BP (!) 146/110 (BP Location: Left Arm)   Pulse 98   Temp 98.5 F (36.9 C) (Oral)   Resp 16   Ht _0  (1.778 m)   Wt 102.1 kg   SpO2 97%   BMI 32.28 kg/m   7:51 PM Signout to Anthony Hubbard at shift change. Awaiting CXR and fluid bolus. Anticipate d/c when completed.     MDM Rules/Calculators/A&P  Patient with COVID-19, diabetes and ongoing hyperglycemia.  Patient has had recent vomiting, however I do not suspect DKA today given lab work.  Will give fluid bolus and reassess.   Final Clinical Impression(s) / ED Diagnoses Final  diagnoses:  Hyperglycemia  COVID-19    Rx / DC Orders ED Discharge Orders    None       Suann Larry 12/02/20 Hanley Ben, MD 12/02/20 2109

## 2020-12-02 NOTE — Discharge Instructions (Addendum)
Please read and follow all provided instructions.  Your diagnoses today include:  1. Hyperglycemia   2. COVID-19     Tests performed today include: Blood cell counts (white, red, and platelets) Electrolytes and blood sugar - high blood sugar (300's) Kidney function test Chest x-ray Vital signs. See below for your results today.   Medications prescribed:  None  Take any prescribed medications only as directed.  Home care instructions:  Follow any educational materials contained in this packet.  BE VERY CAREFUL not to take multiple medicines containing Tylenol (also called acetaminophen). Doing so can lead to an overdose which can damage your liver and cause liver failure and possibly death.   Follow-up instructions: Please follow-up with your primary care provider in the next 3 days for further evaluation of your symptoms.   Return instructions:  Please return to the Emergency Department if you experience worsening symptoms.  Return with worsening shortness of breath, trouble breathing, persistent vomiting. Please return if you have any other emergent concerns.  Additional Information:  Your vital signs today were: BP (!) 146/110 (BP Location: Left Arm)    Pulse 98    Temp 98.5 F (36.9 C) (Oral)    Resp 16    Ht 5\' 10"  (1.778 m)    Wt 102.1 kg    SpO2 97%    BMI 32.28 kg/m  If your blood pressure (BP) was elevated above 135/85 this visit, please have this repeated by your doctor within one month. -------------- At this time there does not appear to be the presence of an emergent medical condition, however there is always the potential for conditions to change. Return to the Emergency Department for any new/concerning or worsening of symptoms.  Please read the additional information packets attached to your discharge summary.  Do not take your medicine if  develop an itchy rash, swelling in your mouth or lips, or difficulty breathing; call 911 and seek immediate emergency  medical attention if this occurs.  You may review your lab tests and imaging results in their entirety on your MyChart account.  Please discuss all results of fully with your primary care provider and other specialist at your follow-up visit.  Note: Portions of this text may have been transcribed using voice recognition software. Every effort was made to ensure accuracy; however, inadvertent computerized transcription errors may still be present.

## 2020-12-19 ENCOUNTER — Other Ambulatory Visit: Payer: BC Managed Care – PPO

## 2020-12-25 NOTE — Progress Notes (Signed)
Subjective:    Patient ID: Anthony Hubbard, male    DOB: [birth year], 35 y.o.   MRN: [id number]  History of Present Illness: 35 y.o.M with type 2 diabetes and hypertension and recent admission for left lower extremity cellulitis and abscess with need for drainage per orthopedics  This is a follow-up visit for hypertension type 2 diabetes and recent admission for left lower extremity cellulitis and abscess with drainage.  The patient's lower extremity is improving and wound care has been ongoing per orthopedics.  Recent notes from orthopedics suggest the wound is slowly healing.  The patient still on Keflex for antibiotic coverage of gram-positive cocci from the cultures.  The patient maintains the Lantus and also now on Trulicity.  Blood sugars have been ranging in the lower area of 1 20-1 40 at home.  The patient is due tetanus vaccine and flu shot.  Patient also needs a foot exam as well as microalbumin check in the urine.  Note the patient has still some pain particular with dressing changes on the wound.  11/17/2019 This is a follow-up visit from a September 2020 visit.  The patient is being treated for type 2 diabetes and hyperlipidemia.  Patient also had cellulitis and abscess of the left leg.  The cellulitis and abscess have resolved and the wound is healed.  The patient is now off antibiotics.  The patient is being followed for diabetes and hypertension.  Noted home he states his blood pressures in the 140/80 range.  His blood sugars have been in the 1 20-1 40 range fasting and then get up to mid 200s postprandial  He has not been compliant with his diet.  Note today's hemoglobin A1c is 11 and his CBG is 260  The patient tends to eat a lot in different times and does not take snacks that are healthy in for him between meals but instead eats unhealthy snacks  01/12/2020 The patient returns today for follow-up and continues to run high sugars although not as high as previous visits.  His  fastings were in the 200 range postprandial 300.  Previously he was fasting in the 450 range.  He is not been able to access his NovoLog due to insurance barriers.  He is on the Lantus at 55 units daily and is on Metformin at 1000 mg twice daily as well now is on Ozempic 0.5 weekly  Blood pressure showing some improved control  We have been giving the patient counseling with regards to his diet   There are no other new complaints   03/29/2020 This patient is seen as a work in visit as he had gone to the ER recently with hyperglycemia again.  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  Initially was working the first shift going in at 4:30 in the morning getting off at 2:30 in the afternoon in the situation he would skip his breakfast.  At other times he is shifted over to an afternoon shift going into work at 2:30 PM getting off at 10:30 PM in that circumstance he found himself skipping dinner or going out and having fast food.  He states normally his blood sugars are 120 fasting and 180 after eating more recently they have been increasing since eating more fast food and skipping meals  He is to be on Lantus 15 units daily Ozempic 1 injection weekly and NovoLog 11 units 3  times a day with meals but he has been skipping meals  05/03/2020 Patient seen today in return follow-up blood sugars have been improved he typically runs in the 160s in the middle of the day preprandial 180s postprandial sometimes overnight he will run in the 160s as well.  Very few blood glucoses are over 200 these days compared to prior.  He did receive his first Covid vaccine which was a Estate manager/land agent vaccine recently.  He has no new complaints at this time.  He is working at [employer] in [employer] and works from 3-11  His dietary habits have improved however he occasionally skips dinner The patient's diabetes protocol is as below  however he occasionally skips the evening dose of NovoLog when he does not eat dinner Increase lantus to 20units daily Increase ozempic to 48m weekly Cont tid novolog 11 units with meals Blood pressure has been trending up and today is 147/107 on arrival he is on the amlodipine 10 mg a day losartan 100 mg a day and HCTZ 12.5 daily  The patient does struggle with poor dentition and has carious teeth in the molar distribution in his right lower jaw that need removal  06/30/2020 This patient is seen today in the mobile health clinic as a walk-in.  He works in a wPhysiological scientistand extreme temperatures.  He had an episode of syncope yesterday.  His blood sugar has been running in 3-400 range recently.  When I ask him what type of diet he has these been eating a lot of peanut butter sandwiches and smoothies with high sugar content.  He has also been skipping lunch.  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 The patient states he has been compliant with his insulin but has not been taking the midday insulin dose. On arrival the patient's blood sugar was 417 after  10 units of regular NovoLog it was down to 410.  The patient states he has been trying to keep up with drinking plenty of water while on the job.   Note on arrival also his blood pressure is 153/99 The patient brings with him a job restriction accommodation form to be filled out and he was asked by his employer to take a week off  to improve his health status.  07/04/2020 This patient is seen as a work in when he went to the emergency room yesterday spent 9 hours and then left that being seen.  At that time lab data did show hyponatremia blood sugar of 417 significant volume depletion negative troponin negative EKG otherwise unremarkable lab data  He states this morning his blood sugars were  in the upper 200s on arrival today is 383.  His girlfriend states he is not been eating meals on a regular basis and drinking beer he states he is eating meals somehow he is not being transparent with our team on this information.  09/22/2020 Is a 35year old male seen in follow-up for type 2 diabetes poorly controlled.  This patient is improving with his glycemic control.  He has more values in the 100 range.  On arrival today is 194.  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  Patient on arrival his blood pressure 130/89.  Patient is try to be more compliant with exercise and his diet.  Patient is fully vaccinated with Covid.  He does need hepatitis C screen. Patient is yet to see a dentist he has numerous carious teeth poor dentition and periodontal disease  10/27/20: Phone visit T2DM.   This patient is seen by way of a telephone visit as he was not able to connect through the video.  Patient now has a new position no longer working for [employer].  He is seeking a letter stating he was not able to go back in the warehouse to work.  He was helping get a position in the lab but he was not able to achieve that.  Patient notes recently his a.m. blood sugars are 130 and afternoon at 4 PM 110 and with this occasionally gets a little dizziness.  He now has a sensor and can check his blood glucose almost any time.  His weight is at 238 pounds.  He does not have a blood pressure monitor yet available.  He is yet to receive a flu vaccine.  The patient has received his Covid vaccines and is now due a booster  there are no other complaints  The patient states he now has a new job and he works for [employer] as a Geophysicist/field seismologist and he is doing better with this particular job  from a medical perspective  12/26/20 HPI Anthony Hubbard is a 35 y.o M  With PMH significant for uncontrolled T2DM and HTN. The patient is seen today for a f/u post covid visit. Diagnosed with Covid on 11/27/2020 and treated for hyperglycemic in the ED on 12/03/20. Reports  Increased polydipsia and polyphagia since covid. Denies polyuria. Reports fasting blood glucose since covid ranges from 140s-150s, and postprandial glucose ranges highs 200s-300s. Daily dietary intake consists of ham sandwiches and soup. 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 States that he is currently out of his Novolin R.  Past Medical History:  Diagnosis Date  . Diabetes mellitus without complication ([place])    type 2   . Hypertension   . Hypertensive urgency 06/07/2019  . Phimosis   . Superficial thrombosis of left lower extremity 06/24/2019     Family History  Problem Relation Age of Onset  . Diabetes Father   . Diabetes Paternal Uncle      Social History   Socioeconomic History  . Marital status: Married    Spouse name: Not on file  . Number of children: Not on file  . Years of education: Not on file  . Highest education level: Not on file  Occupational History  . Not on file  Tobacco Use  . Smoking status: Never Smoker  . Smokeless tobacco: Never Used  Vaping Use  . Vaping Use: Never used  Substance and Sexual Activity  . Alcohol use: No  . Drug use: Not Currently    Types: Marijuana  . Sexual activity: Not on file  Other Topics Concern  . Not on file  Social History Narrative  . Not on file   Social Determinants of Health   Financial Resource Strain: Not on file  Food Insecurity: Not on file  Transportation Needs: Not on file  Physical Activity: Not on file  Stress: Not on file  Social Connections: Not on file  Intimate Partner Violence: Not on file     No Known Allergies   Outpatient Medications Prior to Visit  Medication Sig Dispense Refill  . amLODipine (NORVASC) 10 MG tablet  Take 1 tablet (10 mg total) by mouth daily. 90 tablet 2  . atorvastatin (LIPITOR) 40 MG tablet Take 1 tablet (40 mg total) by mouth daily. 90 tablet 2  . hydrochlorothiazide (HYDRODIURIL) 25 MG tablet Take 1 tablet (25 mg total) by mouth daily. 90 tablet 3  . insulin NPH Human (NOVOLIN N) 100 UNIT/ML injection Inject 0.3 mLs (30 Units total) into the skin at bedtime. 30 mL 3  . insulin regular (NOVOLIN R) 100 units/mL injection Inject 0.19 mLs (19 Units total) into the skin 3 (three) times daily before meals. And syringes 4/day (Patient taking differently: Inject 19 Units into the skin 3 (three) times daily before meals.) 20 mL 3  . losartan (COZAAR) 100 MG tablet Take 1 tablet (100 mg total) by mouth daily. 90 tablet 1  . metFORMIN (GLUCOPHAGE) 500 MG tablet Take 2 tablets (1,000 mg total) by mouth 2 (two) times daily. 120 tablet 2  . metoprolol succinate (TOPROL-XL) 50 MG 24 hr tablet Take 1 tablet (50 mg total) by mouth daily. Take with or immediately following a meal. 90 tablet 3  . terbinafine (LAMISIL) 250 MG tablet Take 1 tablet (250 mg total) by mouth daily. 90 tablet 0  . BD INSULIN SYRINGE U/F 31G X 5/16" 0.3 ML MISC     . blood glucose meter kit and supplies KIT Dispense based on patient and insurance preference. Use up to four times daily as directed. (FOR ICD-9 250.00, 250.01). (Patient not taking: No sig reported) 1 each 0  . Continuous Blood Gluc Receiver (FREESTYLE LIBRE 2 READER) DEVI Use to monitor blood sugar 1 each 6  . fluticasone (FLONASE) 50 MCG/ACT nasal spray Place 2 sprays into both nostrils daily. (Patient not taking: Reported on 12/26/2020) 11.1 mL 0  . ibuprofen (ADVIL) 600 MG tablet Take 1 tablet (600 mg total) by mouth every 8 (eight) hours as needed. (Patient not taking: No sig reported) 60 tablet 1  . Insulin Pen Needle (TRUEPLUS 5-BEVEL PEN NEEDLES) 31G X 5 MM MISC USE AS DIRECTED FOR LANTUS 100 each 3  . albuterol (VENTOLIN HFA) 108 (90 Base) MCG/ACT inhaler Inhale  1-2 puffs into the lungs every 6 (six) hours as needed for wheezing or shortness of breath. (Patient not taking: Reported on 12/26/2020) 8 g 0  . benzonatate (TESSALON) 100 MG capsule Take 1 capsule (100 mg total) by mouth every 8 (eight) hours. (Patient not taking: Reported on 12/26/2020) 21 capsule 0  . cetirizine (ZYRTEC ALLERGY) 10 MG tablet Take 1 tablet (10 mg total) by mouth daily. 30 tablet 0  . Continuous Blood Gluc Sensor (FREESTYLE LIBRE 2 SENSOR) MISC Use to monitor blood sugar 6 each 3   No facility-administered medications prior to visit.    Review of Systems  Constitutional: Negative for chills and fever.  HENT: Positive for dental problem. Negative for postnasal drip, rhinorrhea and sore throat.   Eyes: Negative.   Respiratory: Negative.   Gastrointestinal: Negative.   Endocrine: Positive for polydipsia and polyphagia.  Genitourinary: Negative for enuresis.  Musculoskeletal: Negative.   Skin: Negative.   Allergic/Immunologic: Negative.   Neurological: Positive for light-headedness.  Hematological: Negative.   Psychiatric/Behavioral: Negative.   r   Objective:   Physical ExamBP (!) 155/107   Pulse 88   Wt 234 lb (106.1 kg)  SpO2 98%   BMI 33.58 kg/m  Vitals:   12/26/20 1619  BP: (!) 155/107  Pulse: 88  SpO2: 98%  Weight: 234 lb (106.1 kg)    Gen: Pleasant, well-nourished, in no distress,  normal affect  ENT: No lesions,  mouth clear,  oropharynx clear, no postnasal drip, poor dentition  Neck: No JVD, no TMG, no carotid bruits  Lungs: No use of accessory muscles, no dullness to percussion, clear without rales or rhonchi  Cardiovascular: RRR, heart sounds normal, no murmur or gallops, no peripheral edema  Abdomen: soft and NT, no HSM,  BS normal  Musculoskeletal: No deformities, no cyanosis or clubbing  Neuro: alert, non focal  Skin: Warm, no lesions or rashes    CBC Latest Ref Rng & Units 12/02/2020 11/27/2020 07/04/2020  WBC 4.0 - 10.5 K/uL 5.9 10.4  10.8(H)  Hemoglobin 13.0 - 17.0 g/dL 16.9 17.9(H) 17.4(H)  Hematocrit 39.0 - 52.0 % 48.5 52.2(H) 50.0  Platelets 150 - 400 K/uL 119(L) 210 210   BMP Latest Ref Rng & Units 12/02/2020 11/27/2020 07/04/2020  Glucose 70 - 99 mg/dL 380(H) 290(H) 430(H)  BUN 6 - 20 mg/dL '14 12 13  ' Creatinine 0.61 - 1.24 mg/dL 1.07 0.98 1.10  BUN/Creat Ratio 9 - 20 - - -  Sodium 135 - 145 mmol/L 133(L) 137 130(L)  Potassium 3.5 - 5.1 mmol/L 3.9 4.4 3.9  Chloride 98 - 111 mmol/L 95(L) 103 96(L)  CO2 22 - 32 mmol/L '27 25 25  ' Calcium 8.9 - 10.3 mg/dL 9.1 9.4 9.1  Lipid Panel     Component Value Date/Time   CHOL 169 06/24/2019 1052   TRIG 223 (H) 06/24/2019 1052   HDL 35 (L) 06/24/2019 1052   CHOLHDL 4.8 06/24/2019 1052   LDLCALC 89 06/24/2019 1052   Lab Results  Component Value Date   HGBA1C 11.1 (A) 12/26/2020   cbg 194     Assessment & Plan:  I personally reviewed all images and lab data in the [facility] system as well as any outside material available during this office visit and agree with the  radiology impressions.   Essential hypertension Begin valsartan HCT 1 daily. This is a combination medicine to help control your blood pressure better. Continue metoprolol and amlodipine as prescribed  Hyperlipidemia associated with type 2 diabetes mellitus ([place]) Continue taking atorvastatin. Discontinue losartan and hydrochlorothiazide  Type 2 diabetes mellitus with hyperglycemia, with long-term current use of insulin (HCC) Increase Novolin R to 20 units 3 times daily with meals Increase Novolin N to 35 units at bedtime Continue Metformin as prescribed Recommended ateast 150 minutes of medium-intensity or high-intensity exercise each week.  Exercises may include brisk walking, biking, or water aerobics. Do stretching and strengthening exercises, such as yoga or weight lifting, at least 2 times a week. Educated on the benefits of exercising, including increasing muscle strength, bone density, and reducing body  fat and stress. It also lowers and controls blood glucose. Advised to resume the use of your Dexcom meter to monitor blood sugars Referral to endocrinology to see Dr. Loanne Drilling Referral to the medical nutritionist  COVID Encouraged to get Covid booster shot  Recommanded waiting until mid to end of February since recently gotten Covid  f/u in 2wks to see Luck (clicnical pharmacist) f/u in face to face with Dr. Joya Gaskins in [place] was seen today for follow-up and diabetes.  Diagnoses and all orders for this visit:  Type 2 diabetes mellitus with hyperglycemia, with long-term current  use of insulin (HCC) -     Glucose (CBG) -     HgB A1c -     Amb ref to Medical Nutrition Therapy-MNT -     metFORMIN (GLUCOPHAGE) 500 MG tablet; Take 2 tablets (1,000 mg total) by mouth 2 (two) times daily. -     Ambulatory referral to Endocrinology  Hyperlipidemia associated with type 2 diabetes mellitus ([place]) -     atorvastatin (LIPITOR) 40 MG tablet; Take 1 tablet (40 mg total) by mouth daily.  Essential hypertension -     amLODipine (NORVASC) 10 MG tablet; Take 1 tablet (10 mg total) by mouth daily.  Other orders -     insulin NPH Human (NOVOLIN N) 100 UNIT/ML injection; Inject 0.35 mLs (35 Units total) into the skin at bedtime. -     insulin regular (NOVOLIN R) 100 units/mL injection; Inject 0.2 mLs (20 Units total) into the skin 3 (three) times daily before meals. And syringes 4/day -     valsartan-hydrochlorothiazide (DIOVAN-HCT) 320-25 MG tablet; Take 1 tablet by mouth daily. -     metoprolol succinate (TOPROL-XL) 50 MG 24 hr tablet; Take 1 tablet (50 mg total) by mouth daily. Take with or immediately following a meal. -     Continuous Blood Gluc Sensor (FREESTYLE LIBRE 2 SENSOR) MISC; Use to monitor blood sugar      Essential hypertension Begin valsartan HCT 1 daily. This is a combination medicine to help control your blood pressure better. Continue metoprolol and amlodipine as  prescribed  Hyperlipidemia associated with type 2 diabetes mellitus ([place]) Continue taking atorvastatin. Discontinue losartan and hydrochlorothiazide  Type 2 diabetes mellitus with hyperglycemia, with long-term current use of insulin (HCC) Increase Novolin R to 20 units 3 times daily with meals Increase Novolin N to 35 units at bedtime Continue Metformin as prescribed   Anthony Hubbard was seen today for follow-up and diabetes.  Diagnoses and all orders for this visit:  Type 2 diabetes mellitus with hyperglycemia, with long-term current use of insulin (HCC) -     Glucose (CBG) -     HgB A1c -     Amb ref to Medical Nutrition Therapy-MNT -     metFORMIN (GLUCOPHAGE) 500 MG tablet; Take 2 tablets (1,000 mg total) by mouth 2 (two) times daily. -     Ambulatory referral to Endocrinology  Hyperlipidemia associated with type 2 diabetes mellitus ([place]) -     atorvastatin (LIPITOR) 40 MG tablet; Take 1 tablet (40 mg total) by mouth daily.  Essential hypertension -     amLODipine (NORVASC) 10 MG tablet; Take 1 tablet (10 mg total) by mouth daily.  Other orders -     insulin NPH Human (NOVOLIN N) 100 UNIT/ML injection; Inject 0.35 mLs (35 Units total) into the skin at bedtime. -     insulin regular (NOVOLIN R) 100 units/mL injection; Inject 0.2 mLs (20 Units total) into the skin 3 (three) times daily before meals. And syringes 4/day -     valsartan-hydrochlorothiazide (DIOVAN-HCT) 320-25 MG tablet; Take 1 tablet by mouth daily. -     metoprolol succinate (TOPROL-XL) 50 MG 24 hr tablet; Take 1 tablet (50 mg total) by mouth daily. Take with or immediately following a meal. -     Continuous Blood Gluc Sensor (FREESTYLE LIBRE 2 SENSOR) MISC; Use to monitor blood sugar

## 2020-12-26 ENCOUNTER — Other Ambulatory Visit: Payer: Self-pay

## 2020-12-26 ENCOUNTER — Encounter: Payer: Self-pay | Admitting: Critical Care Medicine

## 2020-12-26 ENCOUNTER — Ambulatory Visit: Payer: BC Managed Care – PPO | Attending: Critical Care Medicine | Admitting: Critical Care Medicine

## 2020-12-26 VITALS — BP 155/107 | HR 88 | Wt 234.0 lb

## 2020-12-26 DIAGNOSIS — I10 Essential (primary) hypertension: Secondary | ICD-10-CM

## 2020-12-26 DIAGNOSIS — E1165 Type 2 diabetes mellitus with hyperglycemia: Secondary | ICD-10-CM | POA: Diagnosis not present

## 2020-12-26 DIAGNOSIS — Z794 Long term (current) use of insulin: Secondary | ICD-10-CM

## 2020-12-26 DIAGNOSIS — E1169 Type 2 diabetes mellitus with other specified complication: Secondary | ICD-10-CM

## 2020-12-26 DIAGNOSIS — E785 Hyperlipidemia, unspecified: Secondary | ICD-10-CM

## 2020-12-26 LAB — POCT GLYCOSYLATED HEMOGLOBIN (HGB A1C): HbA1c, POC (controlled diabetic range): 11.1 % — AB (ref 0.0–7.0)

## 2020-12-26 LAB — GLUCOSE, POCT (MANUAL RESULT ENTRY): POC Glucose: 234 mg/dl — AB (ref 70–99)

## 2020-12-26 MED ORDER — AMLODIPINE BESYLATE 10 MG PO TABS: 10.0000 mg | ORAL_TABLET | Freq: Every day | ORAL | 2 refills | Status: DC

## 2020-12-26 MED ORDER — METFORMIN HCL 500 MG PO TABS: 1000.0000 mg | ORAL_TABLET | Freq: Two times a day (BID) | ORAL | 2 refills | Status: DC

## 2020-12-26 MED ORDER — INSULIN REGULAR HUMAN 100 UNIT/ML IJ SOLN: 20.0000 [IU] | Freq: Three times a day (TID) | INTRAMUSCULAR | 3 refills | Status: DC

## 2020-12-26 MED ORDER — METOPROLOL SUCCINATE ER 50 MG PO TB24: 50.0000 mg | ORAL_TABLET | Freq: Every day | ORAL | 3 refills | Status: DC

## 2020-12-26 MED ORDER — FREESTYLE LIBRE 2 SENSOR MISC: 3 refills | Status: DC

## 2020-12-26 MED ORDER — ATORVASTATIN CALCIUM 40 MG PO TABS
40.0000 mg | ORAL_TABLET | Freq: Every day | ORAL | 2 refills | Status: DC
Start: 2020-12-26 — End: 2021-07-20

## 2020-12-26 MED ORDER — INSULIN NPH (HUMAN) (ISOPHANE) 100 UNIT/ML ~~LOC~~ SUSP: 35.0000 [IU] | Freq: Every day | SUBCUTANEOUS | 3 refills | Status: DC

## 2020-12-26 MED ORDER — VALSARTAN-HYDROCHLOROTHIAZIDE 320-25 MG PO TABS: 1.0000 | ORAL_TABLET | Freq: Every day | ORAL | 1 refills | Status: DC

## 2020-12-26 NOTE — Assessment & Plan Note (Addendum)
Continue taking atorvastatin. Discontinue losartan and hydrochlorothiazide

## 2020-12-26 NOTE — Progress Notes (Incomplete)
Subjective:    Patient ID: Anthony Hubbard, male    DOB: 1986/02/29, 35 y.o.   MRN: [id number]  History of Present Illness: 35 y.o.M with type 2 diabetes and hypertension and recent admission for left lower extremity cellulitis and abscess with need for drainage per orthopedics  This is a follow-up visit for hypertension type 2 diabetes and recent admission for left lower extremity cellulitis and abscess with drainage.  The patient's lower extremity is improving and wound care has been ongoing per orthopedics.  Recent notes from orthopedics suggest the wound is slowly healing.  The patient still on Keflex for antibiotic coverage of gram-positive cocci from the cultures.  The patient maintains the Lantus and also now on Trulicity.  Blood sugars have been ranging in the lower area of 1 20-1 40 at home.  The patient is due tetanus vaccine and flu shot.  Patient also needs a foot exam as well as microalbumin check in the urine.  Note the patient has still some pain particular with dressing changes on the wound.  11/17/2019 This is a follow-up visit from a September 2020 visit.  The patient is being treated for type 2 diabetes and hyperlipidemia.  Patient also had cellulitis and abscess of the left leg.  The cellulitis and abscess have resolved and the wound is healed.  The patient is now off antibiotics.  The patient is being followed for diabetes and hypertension.  Noted home he states his blood pressures in the 140/80 range.  His blood sugars have been in the 1 20-1 40 range fasting and then get up to mid 200s postprandial  He has not been compliant with his diet.  Note today's hemoglobin A1c is 11 and his CBG is 260  The patient tends to eat a lot in different times and does not take snacks that are healthy in for him between meals but instead eats unhealthy snacks  01/12/2020 The patient returns today for follow-up and continues to run high sugars although not as high as previous visits.  His  fastings were in the 200 range postprandial 300.  Previously he was fasting in the 450 range.  He is not been able to access his NovoLog due to insurance barriers.  He is on the Lantus at 55 units daily and is on Metformin at 1000 mg twice daily as well now is on Ozempic 0.5 weekly  Blood pressure showing some improved control  We have been giving the patient counseling with regards to his diet   There are no other new complaints   03/29/2020 This patient is seen as a work in visit as he had gone to the ER recently with hyperglycemia again.  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  Initially was working the first shift going in at 4:30 in the morning getting off at 2:30 in the afternoon in the situation he would skip his breakfast.  At other times he is shifted over to an afternoon shift going into work at 2:30 PM getting off at 10:30 PM in that circumstance he found himself skipping dinner or going out and having fast food.  He states normally his blood sugars are 120 fasting and 180 after eating more recently they have been increasing since eating more fast food and skipping meals  He is to be on Lantus 15 units daily Ozempic 1 injection weekly and NovoLog 11 units 3  times a day with meals but he has been skipping meals  05/03/2020 Patient seen today in return follow-up blood sugars have been improved he typically runs in the 160s in the middle of the day preprandial 180s postprandial sometimes overnight he will run in the 160s as well.  Very few blood glucoses are over 200 these days compared to prior.  He did receive his first Covid vaccine which was a Estate manager/land agent vaccine recently.  He has no new complaints at this time.  He is working at [employer] in [employer] and works from 3-11  His dietary habits have improved however he occasionally skips dinner The patient's diabetes protocol is as below  however he occasionally skips the evening dose of NovoLog when he does not eat dinner Increase lantus to 20units daily Increase ozempic to 49m weekly Cont tid novolog 11 units with meals Blood pressure has been trending up and today is 147/107 on arrival he is on the amlodipine 10 mg a day losartan 100 mg a day and HCTZ 12.5 daily  The patient does struggle with poor dentition and has carious teeth in the molar distribution in his right lower jaw that need removal  06/30/2020 This patient is seen today in the mobile health clinic as a walk-in.  He works in a wPhysiological scientistand extreme temperatures.  He had an episode of syncope yesterday.  His blood sugar has been running in 3-400 range recently.  When I ask him what type of diet he has these been eating a lot of peanut butter sandwiches and smoothies with high sugar content.  He has also been skipping lunch.  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 The patient states he has been compliant with his insulin but has not been taking the midday insulin dose. On arrival the patient's blood sugar was 417 after  10 units of regular NovoLog it was down to 410.  The patient states he has been trying to keep up with drinking plenty of water while on the job.   Note on arrival also his blood pressure is 153/99 The patient brings with him a job restriction accommodation form to be filled out and he was asked by his employer to take a week off  to improve his health status.  07/04/2020 This patient is seen as a work in when he went to the emergency room yesterday spent 9 hours and then left that being seen.  At that time lab data did show hyponatremia blood sugar of 417 significant volume depletion negative troponin negative EKG otherwise unremarkable lab data  He states this morning his blood sugars were  in the upper 200s on arrival today is 383.  His girlfriend states he is not been eating meals on a regular basis and drinking beer he states he is eating meals somehow he is not being transparent with our team on this information.  09/22/2020 Is a 35year old male seen in follow-up for type 2 diabetes poorly controlled.  This patient is improving with his glycemic control.  He has more values in the 100 range.  On arrival today is 194.  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  Patient on arrival his blood pressure 130/89.  Patient is try to be more compliant with exercise and his diet.  Patient is fully vaccinated with Covid.  He does need hepatitis C screen. Patient is yet to see a dentist he has numerous carious teeth poor dentition and periodontal disease  10/27/20: Phone visit T2DM.   This patient is seen by way of a telephone visit as he was not able to connect through the video.  Patient now has a new position no longer working for [employer].  He is seeking a letter stating he was not able to go back in the warehouse to work.  He was helping get a position in the lab but he was not able to achieve that.  Patient notes recently his a.m. blood sugars are 130 and afternoon at 4 PM 110 and with this occasionally gets a little dizziness.  He now has a sensor and can check his blood glucose almost any time.  His weight is at 238 pounds.  He does not have a blood pressure monitor yet available.  He is yet to receive a flu vaccine.  The patient has received his Covid vaccines and is now due a booster  there are no other complaints  The patient states he now has a new job and he works for [employer] as a Geophysicist/field seismologist and he is doing better with this particular job  from a medical perspective  12/26/20  Type 2 diabetes mellitus with hyperglycemia, with long-term current use of insulin ([place]) Per report patient is doing better on the Novulin N/R  insulin that Dr. 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 The patient is seen today for a f/u post covid visit. Diagnosed with Covid on 11/27/2020 and treated for hyperglycemic in the ED on 12/03/20. Reports  Increased polydipsia and polyphagia since covid. Denies polyuria. Reports fasting blood glucose since covid ranges from 140s-150s, and postprandial glucose ranges highs 200s-300s. Daily dietary intake consists of ham sandwiches and soup. 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 States that he is currently out of his Novolin R.  Past Medical History:  Diagnosis Date  . Diabetes mellitus without complication ([place])    type 2   . Hypertension   . Hypertensive urgency 06/07/2019  . Phimosis   . Superficial thrombosis of left lower extremity 06/24/2019     Family History  Problem Relation Age of Onset  . Diabetes Father   . Diabetes Paternal Uncle      Social History   Socioeconomic History  . Marital status: Married    Spouse name: Not on file  . Number of children: Not on file  . Years  of education: Not on file  . Highest education level: Not on file  Occupational History  . Not  on file  Tobacco Use  . Smoking status: Never Smoker  . Smokeless tobacco: Never Used  Vaping Use  . Vaping Use: Never used  Substance and Sexual Activity  . Alcohol use: No  . Drug use: Not Currently    Types: Marijuana  . Sexual activity: Not on file  Other Topics Concern  . Not on file  Social History Narrative  . Not on file   Social Determinants of Health   Financial Resource Strain: Not on file  Food Insecurity: Not on file  Transportation Needs: Not on file  Physical Activity: Not on file  Stress: Not on file  Social Connections: Not on file  Intimate Partner Violence: Not on file     No Known Allergies   Outpatient Medications Prior to Visit  Medication Sig Dispense Refill  . albuterol (VENTOLIN HFA) 108 (90 Base) MCG/ACT inhaler Inhale 1-2 puffs into the lungs every 6 (six) hours as needed for wheezing or shortness of breath. 8 g 0  . amLODipine (NORVASC) 10 MG tablet Take 1 tablet (10 mg total) by mouth daily. 90 tablet 2  . atorvastatin (LIPITOR) 40 MG tablet Take 1 tablet (40 mg total) by mouth daily. 90 tablet 2  . BD INSULIN SYRINGE U/F 31G X 5/16" 0.3 ML MISC     . benzonatate (TESSALON) 100 MG capsule Take 1 capsule (100 mg total) by mouth every 8 (eight) hours. 21 capsule 0  . blood glucose meter kit and supplies KIT Dispense based on patient and insurance preference. Use up to four times daily as directed. (FOR ICD-9 250.00, 250.01). (Patient not taking: No sig reported) 1 each 0  . cetirizine (ZYRTEC ALLERGY) 10 MG tablet Take 1 tablet (10 mg total) by mouth daily. 30 tablet 0  . Continuous Blood Gluc Receiver (FREESTYLE LIBRE 2 READER) DEVI Use to monitor blood sugar 1 each 6  . Continuous Blood Gluc Sensor (FREESTYLE LIBRE 2 SENSOR) MISC Use to monitor blood sugar 6 each 3  . fluticasone (FLONASE) 50 MCG/ACT nasal spray Place 2 sprays into both nostrils daily. 11.1 mL 0  . hydrochlorothiazide (HYDRODIURIL) 25 MG tablet Take 1 tablet (25 mg total) by mouth  daily. 90 tablet 3  . ibuprofen (ADVIL) 600 MG tablet Take 1 tablet (600 mg total) by mouth every 8 (eight) hours as needed. (Patient not taking: No sig reported) 60 tablet 1  . insulin NPH Human (NOVOLIN N) 100 UNIT/ML injection Inject 0.3 mLs (30 Units total) into the skin at bedtime. 30 mL 3  . Insulin Pen Needle (TRUEPLUS 5-BEVEL PEN NEEDLES) 31G X 5 MM MISC USE AS DIRECTED FOR LANTUS 100 each 3  . insulin regular (NOVOLIN R) 100 units/mL injection Inject 0.19 mLs (19 Units total) into the skin 3 (three) times daily before meals. And syringes 4/day (Patient taking differently: Inject 19 Units into the skin 3 (three) times daily before meals.) 20 mL 3  . losartan (COZAAR) 100 MG tablet Take 1 tablet (100 mg total) by mouth daily. 90 tablet 1  . metFORMIN (GLUCOPHAGE) 500 MG tablet Take 2 tablets (1,000 mg total) by mouth 2 (two) times daily. 120 tablet 2  . metoprolol succinate (TOPROL-XL) 50 MG 24 hr tablet Take 1 tablet (50 mg total) by mouth daily. Take with or immediately following a meal. 90 tablet 3  . terbinafine (LAMISIL) 250 MG tablet Take 1  tablet (250 mg total) by mouth daily. 90 tablet 0   No facility-administered medications prior to visit.    Review of Systems  Constitutional: Negative for chills and fever.  HENT: Negative.  Negative for postnasal drip, rhinorrhea and sore throat.   Eyes: Negative.   Respiratory: Negative.   Gastrointestinal: Negative.   Endocrine: Positive for polydipsia and polyphagia.  Genitourinary: Negative for enuresis.  Musculoskeletal: Negative.   Skin: Negative.   Allergic/Immunologic: Negative.   Neurological: Positive for light-headedness.  Hematological: Negative.   Psychiatric/Behavioral: Negative.    Constitutional:   No  weight loss, night sweats,  Fevers, chills, fatigue, lassitude. HEENT:   headaches,  Difficulty swallowing,  Tooth/dental problems,  Sore throat,                No sneezing, itching, ear ache, nasal congestion, post nasal  drip,   CV:  No chest pain,  Orthopnea, PND, swelling in lower extremities, anasarca, dizziness, palpitations  GI  No heartburn, indigestion, abdominal pain, nausea, vomiting, diarrhea, change in bowel habits, loss of appetite  Resp: No shortness of breath with exertion or at rest.  No excess mucus, no productive cough,  No non-productive cough,  No coughing up of blood.  No change in color of mucus.  No wheezing.  No chest wall deformity  Skin: no rash or lesions.  GU: no dysuria, change in color of urine, no urgency or frequency.  No flank pain.  MS:  No joint pain or swelling.  No decreased range of motion.  No back pain.  Psych:  No change in mood or affect. No depression or anxiety.  No memory loss.     Objective:   Physical ExamThere were no vitals taken for this visit. A telephone visit there is no exam  CBC Latest Ref Rng & Units 12/02/2020 11/27/2020 07/04/2020  WBC 4.0 - 10.5 K/uL 5.9 10.4 10.8(H)  Hemoglobin 13.0 - 17.0 g/dL 16.9 17.9(H) 17.4(H)  Hematocrit 39.0 - 52.0 % 48.5 52.2(H) 50.0  Platelets 150 - 400 K/uL 119(L) 210 210   BMP Latest Ref Rng & Units 12/02/2020 11/27/2020 07/04/2020  Glucose 70 - 99 mg/dL 380(H) 290(H) 430(H)  BUN 6 - 20 mg/dL '14 12 13  ' Creatinine 0.61 - 1.24 mg/dL 1.07 0.98 1.10  BUN/Creat Ratio 9 - 20 - - -  Sodium 135 - 145 mmol/L 133(L) 137 130(L)  Potassium 3.5 - 5.1 mmol/L 3.9 4.4 3.9  Chloride 98 - 111 mmol/L 95(L) 103 96(L)  CO2 22 - 32 mmol/L '27 25 25  ' Calcium 8.9 - 10.3 mg/dL 9.1 9.4 9.1  Lipid Panel     Component Value Date/Time   CHOL 169 06/24/2019 1052   TRIG 223 (H) 06/24/2019 1052   HDL 35 (L) 06/24/2019 1052   CHOLHDL 4.8 06/24/2019 1052   LDLCALC 89 06/24/2019 1052   Lab Results  Component Value Date   HGBA1C 10.2 (A) 09/01/2020   cbg 194     Assessment & Plan:  I personally reviewed all images and lab data in the [facility] system as well as any outside material available during this office visit and agree with the  radiology  impressions.   Essential hypertension Begin valsartan HCT 1 daily. This is a combination medicine to help control your blood pressure better. Continue metoprolol and amlodipine as prescribed  Hyperlipidemia associated with type 2 diabetes mellitus ([place]) Continue taking atorvastatin. Discontinue losartan and hydrochlorothiazide  Type 2 diabetes mellitus with hyperglycemia, with long-term current use of insulin (  [place]) Increase Novolin R to 20 units 3 times daily with meals Increase Novolin N to 35 units at bedtime Continue Metformin as prescribed  COVID Encouraged to get Covid booster shot  Recommanded waiting until mid to end of February since recently gotten Covid  F/U in 2wks to see Luck (clicnical pharmacist) F/U in  Return to see Lurena Joiner in the next 2 weeks our clinical pharmacist  Return to see Dr. Joya Gaskins in 6 weeks face-to-face  No problem-specific Assessment & Plan notes found for this encounter.   There are no diagnoses linked to this encounter.

## 2020-12-26 NOTE — Assessment & Plan Note (Addendum)
Begin valsartan HCT 1 daily. This is a combination medicine to help control your blood pressure better. Continue metoprolol and amlodipine as prescribed

## 2020-12-26 NOTE — Assessment & Plan Note (Signed)
Increase Novolin R to 20 units 3 times daily with meals Increase Novolin N to 35 units at bedtime Continue Metformin as prescribed

## 2020-12-26 NOTE — Progress Notes (Signed)
Needs RF's on medication - insulin  CBG-[id number] A1C- 11.1

## 2020-12-26 NOTE — Patient Instructions (Addendum)
Please resume the use of your Dexcom meter to monitor your sugars Referral back to endocrinology will be made to see Dr. Everardo All Referral back to medical nutritionist will be made  Increase Novolin R to 20 units 3 times daily with meals  Increase Novolin N to 35 units at bedtime  Continue Metformin as prescribed  Discontinue losartan and hydrochlorothiazide  Begin valsartan HCT 1 daily this is a combination medicine of these medicines of losartan and hydrochlorothiazide to help control your blood pressure better  Continue metoprolol and amlodipine as prescribed  [facility] [address]  Please get your Covid booster shot but I would wait on this until mid to end of February since she just had Covid  Remember our discussion regarding your exercise program with diabetes see below  Return to see [name] in the next 2 weeks our clinical pharmacist  Return to see Dr. Delford Field in 6 weeks face-to-face   Diabetes Mellitus and Exercise Exercising regularly is important for overall health, especially for people who have diabetes mellitus. Exercising is not only about losing weight. It has many other health benefits, such as increasing muscle strength and bone density and reducing body fat and stress. This leads to improved fitness, flexibility, and endurance, all of which result in better overall health. What are the benefits of exercise if I have diabetes? Exercise has many benefits for people with diabetes. They include:  Helping to lower and control blood sugar (glucose).  Helping the body to respond better to the hormone insulin by improving insulin sensitivity.  Reducing how much insulin the body needs.  Lowering the risk for heart disease by: ? Lowering "bad" cholesterol and triglyceride levels. ? Increasing "good" cholesterol levels. ? Lowering blood pressure. ? Lowering blood glucose levels. What is my activity plan? Your health care provider or  certified diabetes educator can help you make a plan for the type and frequency of exercise that works for you. This is called your activity plan. Be sure to:  Get at least 150 minutes of medium-intensity or high-intensity exercise each week. Exercises may include brisk walking, biking, or water aerobics.  Do stretching and strengthening exercises, such as yoga or weight lifting, at least 2 times a week.  Spread out your activity over at least 3 days of the week.  Get some form of physical activity each day. ? Do not go more than 2 days in a row without some kind of physical activity. ? Avoid being inactive for more than 90 minutes at a time. Take frequent breaks to walk or stretch.  Choose exercises or activities that you enjoy. Set realistic goals.  Start slowly and gradually increase your exercise intensity over time.   How do I manage my diabetes during exercise? Monitor your blood glucose  Check your blood glucose before and after exercising. If your blood glucose is: ? 240 mg/dL (70.6 mmol/L) or higher before you exercise, check your urine for ketones. These are chemicals created by the liver. If you have ketones in your urine, do not exercise until your blood glucose returns to normal. ? 100 mg/dL (5.6 mmol/L) or lower, eat a snack containing 15-20 grams of carbohydrate. Check your blood glucose 15 minutes after the snack to make sure that your glucose level is above 100 mg/dL (5.6 mmol/L) before you start your exercise.  Know the symptoms of low blood glucose (hypoglycemia) and how to treat it. Your risk for hypoglycemia increases during and after exercise.  Follow these tips and your health care provider's instructions  Keep a carbohydrate snack that is fast-acting for use before, during, and after exercise to help prevent or treat hypoglycemia.  Avoid injecting insulin into areas of the body that are going to be exercised. For example, avoid injecting insulin into: ? Your arms,  when you are about to play tennis. ? Your legs, when you are about to go jogging.  Keep records of your exercise habits. Doing this can help you and your health care provider adjust your diabetes management plan as needed. Write down: ? Food that you eat before and after you exercise. ? Blood glucose levels before and after you exercise. ? The type and amount of exercise you have done.  Work with your health care provider when you start a new exercise or activity. He or she may need to: ? Make sure that the activity is safe for you. ? Adjust your insulin, other medicines, and food that you eat.  Drink plenty of water while you exercise. This prevents loss of water (dehydration) and problems caused by a lot of heat in the body (heat stroke).   Where to find more information  American Diabetes Association: www.diabetes.org Summary  Exercising regularly is important for overall health, especially for people who have diabetes mellitus.  Exercising has many health benefits. It increases muscle strength and bone density and reduces body fat and stress. It also lowers and controls blood glucose.  Your health care provider or certified diabetes educator can help you make an activity plan for the type and frequency of exercise that works for you.  Work with your health care provider to make sure any new activity is safe for you. Also work with your health care provider to adjust your insulin, other medicines, and the food you eat. This information is not intended to replace advice given to you by your health care provider. Make sure you discuss any questions you have with your health care provider. Document Revised: 08/10/2019 Document Reviewed: 08/10/2019 Elsevier Patient Education  2021 [employer].

## 2020-12-27 ENCOUNTER — Encounter: Payer: Self-pay | Admitting: Critical Care Medicine

## 2020-12-27 ENCOUNTER — Other Ambulatory Visit: Payer: Self-pay

## 2020-12-29 ENCOUNTER — Ambulatory Visit: Payer: BC Managed Care – PPO | Admitting: Endocrinology

## 2021-01-09 ENCOUNTER — Ambulatory Visit: Payer: BC Managed Care – PPO | Admitting: Pharmacist

## 2021-01-10 ENCOUNTER — Ambulatory Visit: Payer: BC Managed Care – PPO | Admitting: Pharmacist

## 2021-01-11 ENCOUNTER — Ambulatory Visit: Payer: BC Managed Care – PPO | Admitting: Registered"

## 2021-01-13 ENCOUNTER — Ambulatory Visit: Payer: BC Managed Care – PPO | Admitting: Pharmacist

## 2021-01-19 IMAGING — MR MRI OF LOWER LEFT EXTREMITY WITHOUT AND WITH CONTRAST
5 of 9 series · 18 of 40 positions shown · IV contrast (gadavist)
Comparison: None.

CLINICAL DATA: Pt states since [DATE], pt had Cutberto Sodhi in
left leg. Pt states that he has bruising. Pt states that has since
gone away. Pt states he has a large bump on his left lower leg. Pt
went to diabetic clinic, who told him it was an infection

EXAM:
MRI OF LOWER LEFT EXTREMITY WITHOUT AND WITH CONTRAST
TECHNIQUE: Multiplanar, multisequence MR imaging of the left leg was performed
both before and after administration of intravenous contrast.
CONTRAST:  10 mL of Gadavist, intravenously.

[Series 4: T1 · axial · 7.0mm · 0.47mm/px · z∈[-368,+42]mm · 6 of 54 slices shown (1 of 2)]
[im 1/54]
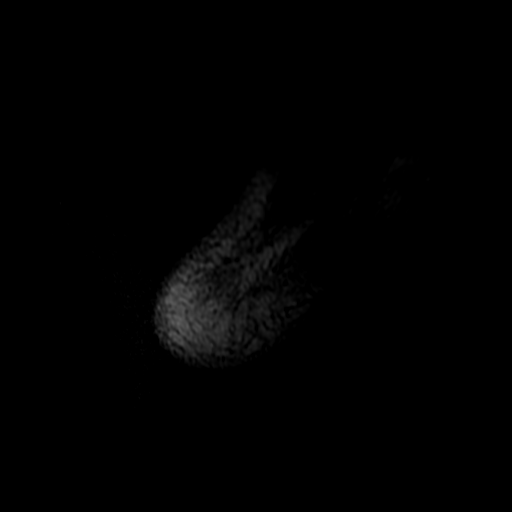
[im 11/54]
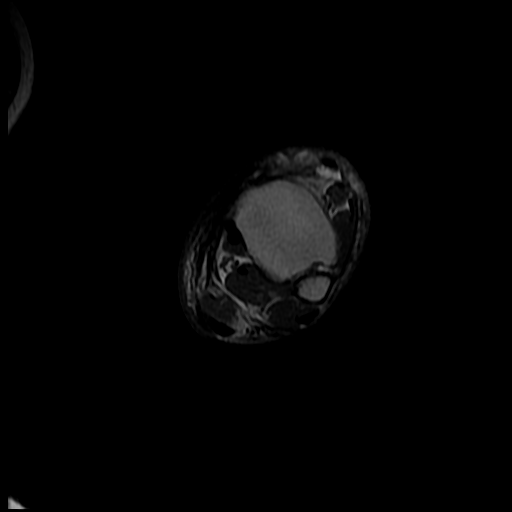
[im 22/54]
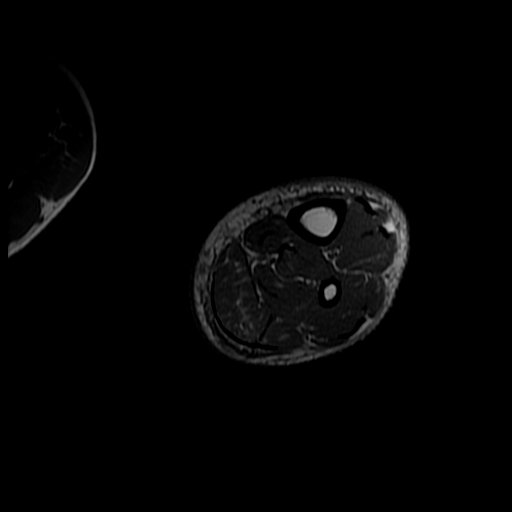
[im 32/54]
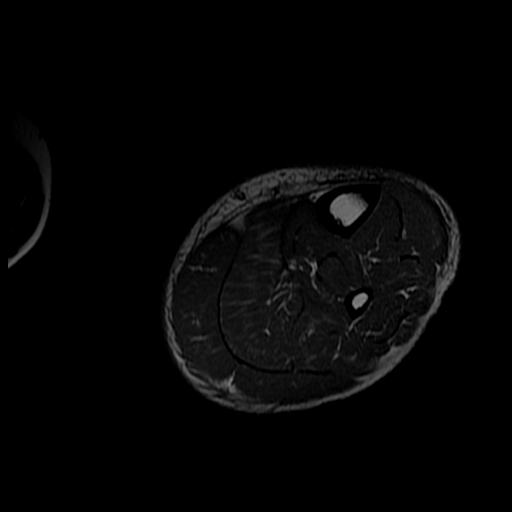
[im 43/54]
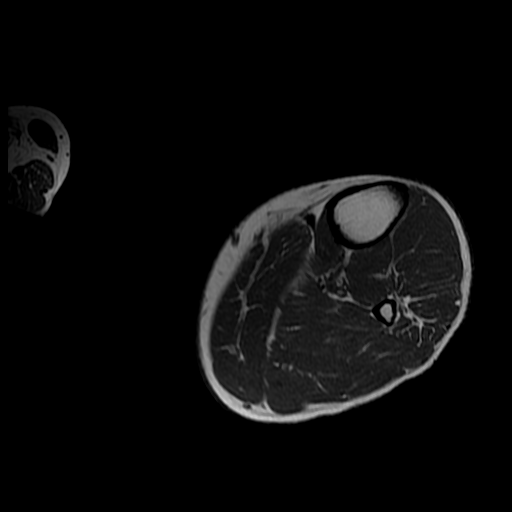
[im 54/54]
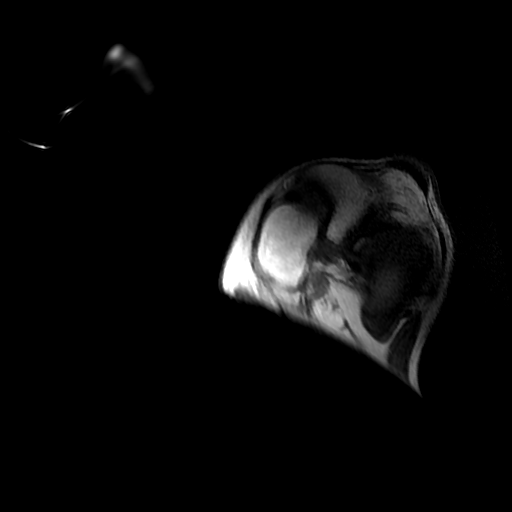

[Series 6: T2 fat-sat · axial · 7.0mm · 0.47mm/px · z∈[-368,+42]mm · 5 of 54 slices shown (1 of 2)]
[im 1/54]
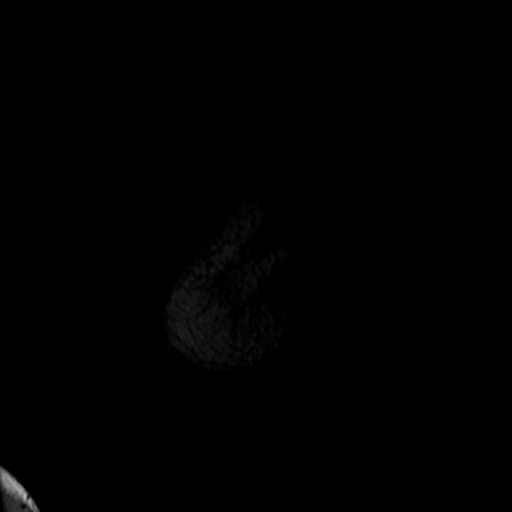
[im 14/54]
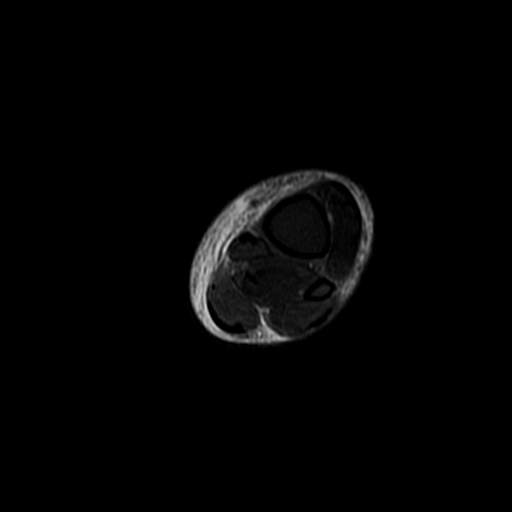
[im 27/54]
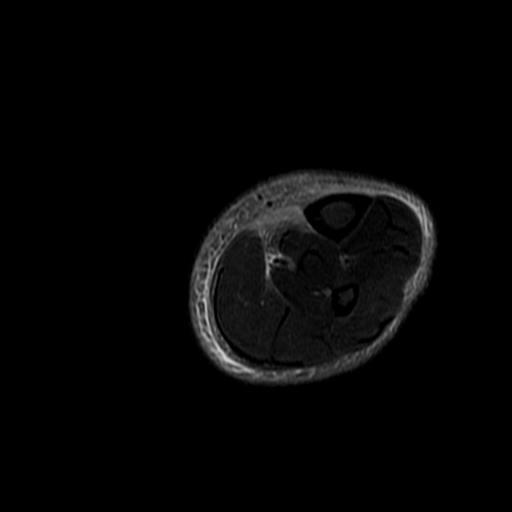
[im 40/54]
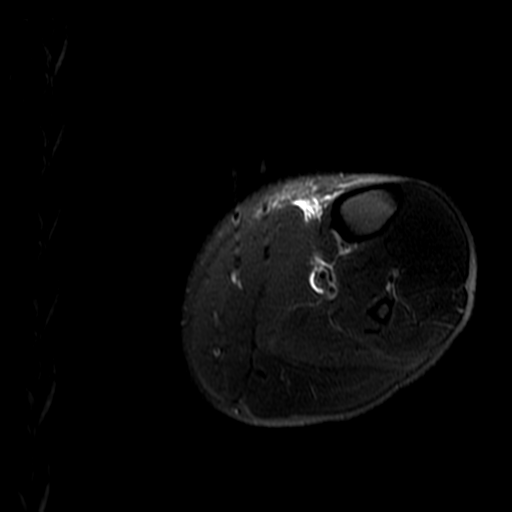
[im 54/54]
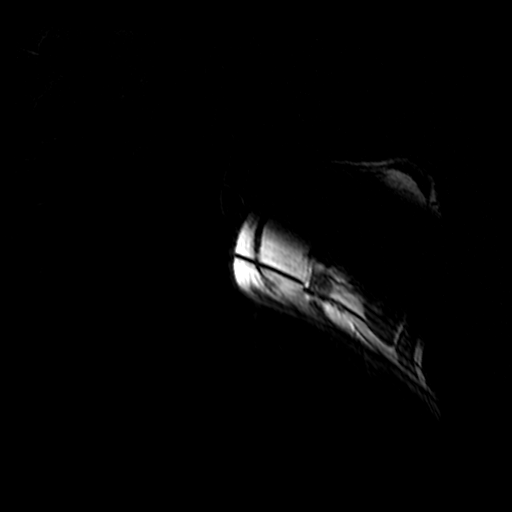

[Series 7: T2 fat-sat · coronal · 5.0mm · 0.90mm/px · 2 of 24 slices shown (2 of 2)]
[im 1/24]
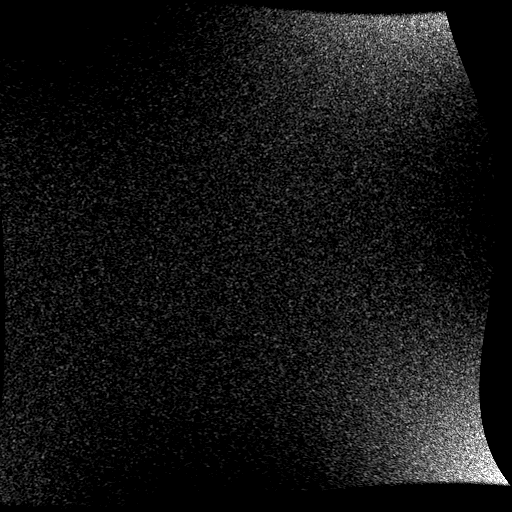
[im 24/24]
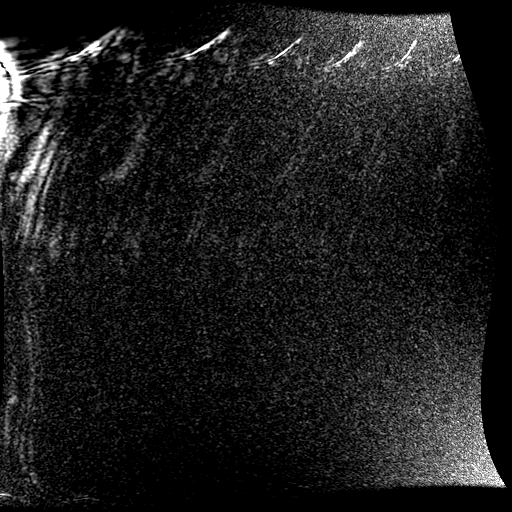

[Series 9: T1 · oblique · 4.0mm · 0.94mm/px · 4 of 36 slices shown (2 of 2)]
[im 1/36]
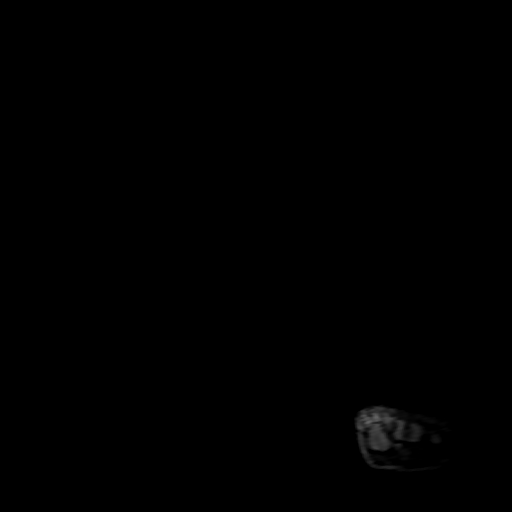
[im 12/36]
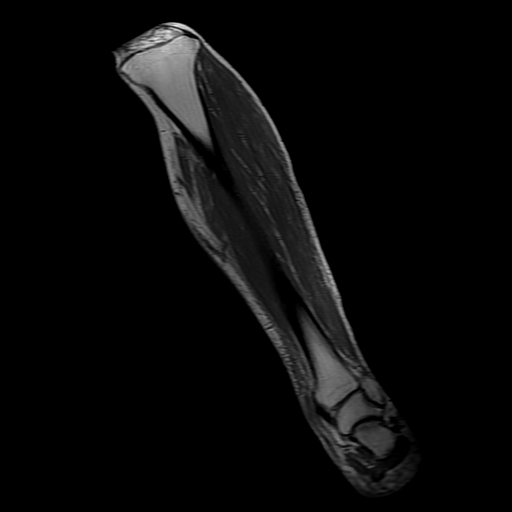
[im 24/36]
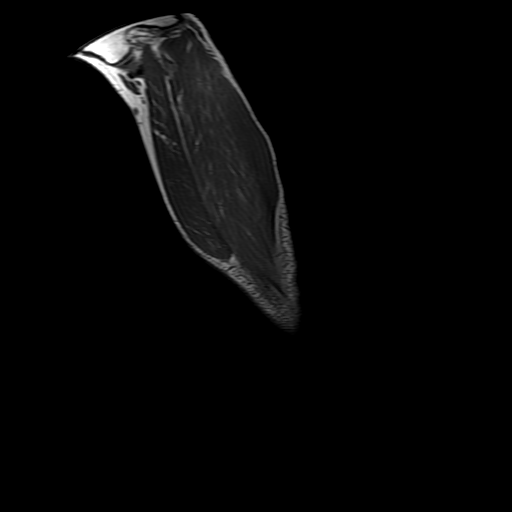
[im 36/36]
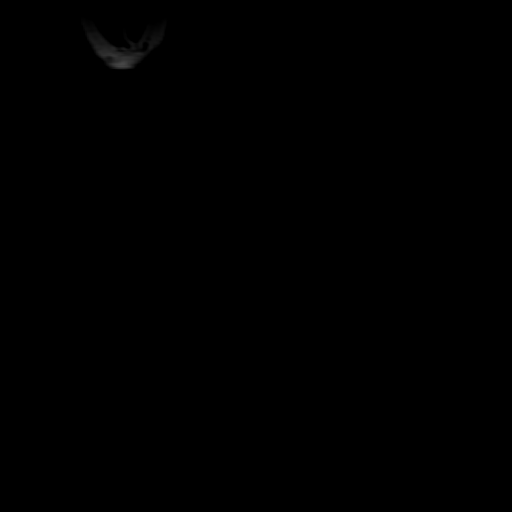

[Series 11: T1 fat-sat · axial · 7.0mm · 0.47mm/px · 1 of 54 slices shown]
[im 1/54]
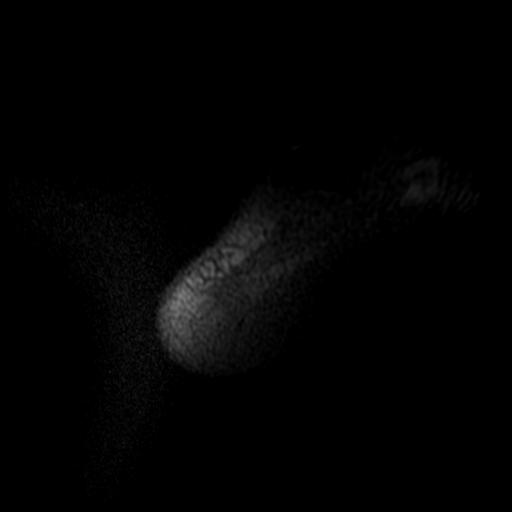

[18 of 40 positions shown; findings below may reference images not displayed]

FINDINGS: Bones/Joint/Cartilage

No abnormal bone marrow signal. No bone lesion. Joints are normally
spaced and aligned. No joint effusion.

Ligaments

Knee not well assessed.  Visualized ankle ligaments appear intact.

Muscles and Tendons

There is abnormal increased T2 signal within the lateral aspect of
the flexor digitorum longus muscle and the adjacent medial margin of
the soleus muscle with abnormal enhancement in the same locations.
The adjacent subcutaneous soft tissues show increased T2 signal with
abnormal enhancement noted more focally adjacent to the areas of
abnormal signal and enhancement of the flexor digitorum longus
muscle and medial psoas muscle. Remaining left leg muscles show
normal signal with no other areas of abnormal enhancement.

The posterior tibial tendon shows evidence of a longitudinal splint
below the level of the medial malleolus, not well assessed on this
exam. Remaining tendons are normal in signal and thickness.

Soft tissues

There is diffuse increased T2 signal throughout the subcutaneous
soft tissues and extending along the fascia between the flexor
digitorum longus muscle and the soleus muscle. A sliver of fluid
tracks along the medial margins of the soleus and adjacent medial
gastrocnemius muscles. No MRI evidence of soft tissue air. No
defined fluid collection to suggest an abscess.
IMPRESSION: 1. There is abnormal signal and enhancement along the medial aspect
of the flexor digitorum longus muscle and the adjacent medial soleus
muscle, associated with overlying soft tissue edema and enhancement.
Findings are consistent with both cellulitis and myositis. There is
no defined abscess. Fluid tracking along the medial margins of the
soleus and medial gastrocnemius muscles raises the possibility of
fascitis, but there is no evidence soft tissue air, which argues
against this diagnosis.
2. Possible longitudinal split of the distal posterior tibial
tendon, presumed unrelated to the current acute findings. This would
be best assessed with ankle MRI.
3. There is more diffuse subcutaneous edema surrounding the left
leg, predominating anteromedially.

## 2021-01-26 ENCOUNTER — Other Ambulatory Visit: Payer: Self-pay

## 2021-01-30 ENCOUNTER — Ambulatory Visit: Payer: BC Managed Care – PPO | Admitting: Endocrinology

## 2021-02-07 ENCOUNTER — Ambulatory Visit: Payer: BC Managed Care – PPO | Admitting: Critical Care Medicine

## 2021-02-07 NOTE — Progress Notes (Deleted)
Subjective:    Patient ID: Anthony Hubbard, male    DOB: 1986/07/12, 35 y.o.   MRN: [id number]  History of Present Illness: 35 y.o.M with type 2 diabetes and hypertension and recent admission for left lower extremity cellulitis and abscess with need for drainage per orthopedics  This is a follow-up visit for hypertension type 2 diabetes and recent admission for left lower extremity cellulitis and abscess with drainage.  The patient's lower extremity is improving and wound care has been ongoing per orthopedics.  Recent notes from orthopedics suggest the wound is slowly healing.  The patient still on Keflex for antibiotic coverage of gram-positive cocci from the cultures.  The patient maintains the Lantus and also now on Trulicity.  Blood sugars have been ranging in the lower area of 1 20-1 40 at home.  The patient is due tetanus vaccine and flu shot.  Patient also needs a foot exam as well as microalbumin check in the urine.  Note the patient has still some pain particular with dressing changes on the wound.  11/17/2019 This is a follow-up visit from a September 2020 visit.  The patient is being treated for type 2 diabetes and hyperlipidemia.  Patient also had cellulitis and abscess of the left leg.  The cellulitis and abscess have resolved and the wound is healed.  The patient is now off antibiotics.  The patient is being followed for diabetes and hypertension.  Noted home he states his blood pressures in the 140/80 range.  His blood sugars have been in the 1 20-1 40 range fasting and then get up to mid 200s postprandial  He has not been compliant with his diet.  Note today's hemoglobin A1c is 11 and his CBG is 260  The patient tends to eat a lot in different times and does not take snacks that are healthy in for him between meals but instead eats unhealthy snacks  01/12/2020 The patient returns today for follow-up and continues to run high sugars although not as high as previous visits.  His  fastings were in the 200 range postprandial 300.  Previously he was fasting in the 450 range.  He is not been able to access his NovoLog due to insurance barriers.  He is on the Lantus at 55 units daily and is on Metformin at 1000 mg twice daily as well now is on Ozempic 0.5 weekly  Blood pressure showing some improved control  We have been giving the patient counseling with regards to his diet   There are no other new complaints   03/29/2020 This patient is seen as a work in visit as he had gone to the ER recently with hyperglycemia again.  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  Initially was working the first shift going in at 4:30 in the morning getting off at 2:30 in the afternoon in the situation he would skip his breakfast.  At other times he is shifted over to an afternoon shift going into work at 2:30 PM getting off at 10:30 PM in that circumstance he found himself skipping dinner or going out and having fast food.  He states normally his blood sugars are 120 fasting and 180 after eating more recently they have been increasing since eating more fast food and skipping meals  He is to be on Lantus 15 units daily Ozempic 1 injection weekly and NovoLog 11 units 3  times a day with meals but he has been skipping meals  05/03/2020 Patient seen today in return follow-up blood sugars have been improved he typically runs in the 160s in the middle of the day preprandial 180s postprandial sometimes overnight he will run in the 160s as well.  Very few blood glucoses are over 200 these days compared to prior.  He did receive his first Covid vaccine which was a Estate manager/land agent vaccine recently.  He has no new complaints at this time.  He is working at [employer] in [employer] and works from 3-11  His dietary habits have improved however he occasionally skips dinner The patient's diabetes protocol is as below  however he occasionally skips the evening dose of NovoLog when he does not eat dinner Increase lantus to 20units daily Increase ozempic to 60m weekly Cont tid novolog 11 units with meals Blood pressure has been trending up and today is 147/107 on arrival he is on the amlodipine 10 mg a day losartan 100 mg a day and HCTZ 12.5 daily  The patient does struggle with poor dentition and has carious teeth in the molar distribution in his right lower jaw that need removal  06/30/2020 This patient is seen today in the mobile health clinic as a walk-in.  He works in a wPhysiological scientistand extreme temperatures.  He had an episode of syncope yesterday.  His blood sugar has been running in 3-400 range recently.  When I ask him what type of diet he has these been eating a lot of peanut butter sandwiches and smoothies with high sugar content.  He has also been skipping lunch.  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 The patient states he has been compliant with his insulin but has not been taking the midday insulin dose. On arrival the patient's blood sugar was 417 after  10 units of regular NovoLog it was down to 410.  The patient states he has been trying to keep up with drinking plenty of water while on the job.   Note on arrival also his blood pressure is 153/99 The patient brings with him a job restriction accommodation form to be filled out and he was asked by his employer to take a week off  to improve his health status.  07/04/2020 This patient is seen as a work in when he went to the emergency room yesterday spent 9 hours and then left that being seen.  At that time lab data did show hyponatremia blood sugar of 417 significant volume depletion negative troponin negative EKG otherwise unremarkable lab data  He states this morning his blood sugars were  in the upper 200s on arrival today is 383.  His girlfriend states he is not been eating meals on a regular basis and drinking beer he states he is eating meals somehow he is not being transparent with our team on this information.  09/22/2020 Is a 35year old male seen in follow-up for type 2 diabetes poorly controlled.  This patient is improving with his glycemic control.  He has more values in the 100 range.  On arrival today is 194.  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  Patient on arrival his blood pressure 130/89.  Patient is try to be more compliant with exercise and his diet.  Patient is fully vaccinated with Covid.  He does need hepatitis C screen. Patient is yet to see a dentist he has numerous carious teeth poor dentition and periodontal disease  10/27/20: Phone visit T2DM.   This patient is seen by way of a telephone visit as he was not able to connect through the video.  Patient now has a new position no longer working for [employer].  He is seeking a letter stating he was not able to go back in the warehouse to work.  He was helping get a position in the lab but he was not able to achieve that.  Patient notes recently his a.m. blood sugars are 130 and afternoon at 4 PM 110 and with this occasionally gets a little dizziness.  He now has a sensor and can check his blood glucose almost any time.  His weight is at 238 pounds.  He does not have a blood pressure monitor yet available.  He is yet to receive a flu vaccine.  The patient has received his Covid vaccines and is now due a booster  there are no other complaints  The patient states he now has a new job and he works for [employer] as a Geophysicist/field seismologist and he is doing better with this particular job  from a medical perspective  12/26/20 HPI Anthony Hubbard is a 35 y.o M  With PMH significant for uncontrolled T2DM and HTN. The patient is seen today for a f/u post covid visit. Diagnosed with Covid on 11/27/2020 and treated for hyperglycemic in the ED on 12/03/20. Reports  Increased polydipsia and polyphagia since covid. Denies polyuria. Reports fasting blood glucose since covid ranges from 140s-150s, and postprandial glucose ranges highs 200s-300s. Daily dietary intake consists of ham sandwiches and soup. 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 States that he is currently out of his Novolin R.  02/07/2021  Essential hypertension Begin valsartan HCT 1 daily. This is a combination medicine to help control your blood pressure better. Continue metoprolol and amlodipine as prescribed  Hyperlipidemia associated with type 2 diabetes mellitus ([place]) Continue taking atorvastatin. Discontinue losartan and hydrochlorothiazide  Type 2 diabetes mellitus with hyperglycemia, with long-term current use of insulin (HCC) Increase Novolin R to 20 units 3 times daily with meals Increase Novolin N to 35 units at bedtime Continue Metformin as prescribed Recommended ateast 150 minutes of medium-intensity or high-intensity exercise each week.  Exercises may include brisk walking, biking, or water aerobics. Do stretching and strengthening exercises, such as yoga or weight lifting, at least 2 times a week. Educated on the benefits of exercising, including increasing muscle strength, bone density, and reducing body fat and stress. It also lowers and controls blood glucose. Advised to resume the use of your Dexcom meter to monitor blood sugars Referral to endocrinology to see Dr. Loanne Drilling Referral to the medical nutritionist  COVID Encouraged to get Covid booster shot  Recommanded waiting until mid to end of February since recently gotten Covid  f/u in 2wks to see Luck (clicnical pharmacist) f/u in face to face  with Dr. Joya Gaskins in [place] was seen today for follow-up and diabetes.  Diagnoses and all orders for this visit:  Type 2 diabetes mellitus with  hyperglycemia, with long-term current use of insulin (HCC) -     Glucose (CBG) -     HgB A1c -     Amb ref to Medical Nutrition Therapy-MNT -     metFORMIN (GLUCOPHAGE) 500 MG tablet; Take 2 tablets (1,000 mg total) by mouth 2 (two) times daily. -     Ambulatory referral to Endocrinology  Hyperlipidemia associated with type 2 diabetes mellitus ([place]) -     atorvastatin (LIPITOR) 40 MG tablet; Take 1 tablet (40 mg total) by mouth daily.  Essential hypertension -     amLODipine (NORVASC) 10 MG tablet; Take 1 tablet (10 mg total) by mouth daily.  Other orders -     insulin NPH Human (NOVOLIN N) 100 UNIT/ML injection; Inject 0.35 mLs (35 Units total) into the skin at bedtime. -     insulin regular (NOVOLIN R) 100 units/mL injection; Inject 0.2 mLs (20 Units total) into the skin 3 (three) times daily before meals. And syringes 4/day -     valsartan-hydrochlorothiazide (DIOVAN-HCT) 320-25 MG tablet; Take 1 tablet by mouth daily. -     metoprolol succinate (TOPROL-XL) 50 MG 24 hr tablet; Take 1 tablet (50 mg total) by mouth daily. Take with or immediately following a meal. -     Continuous Blood Gluc Sensor (FREESTYLE LIBRE 2 SENSOR) MISC; Use to monitor blood sugar      Essential hypertension Begin valsartan HCT 1 daily. This is a combination medicine to help control your blood pressure better. Continue metoprolol and amlodipine as prescribed  Hyperlipidemia associated with type 2 diabetes mellitus ([place]) Continue taking atorvastatin. Discontinue losartan and hydrochlorothiazide  Type 2 diabetes mellitus with hyperglycemia, with long-term current use of insulin (HCC) Increase Novolin R to 20 units 3 times daily with meals Increase Novolin N to 35 units at bedtime Continue Metformin as prescribed   Anthony Hubbard was seen today for follow-up  and diabetes.  Diagnoses and all orders for this visit:  Type 2 diabetes mellitus with hyperglycemia, with long-term current use of insulin (HCC) -     Glucose (CBG) -     HgB A1c -     Amb ref to Medical Nutrition Therapy-MNT -     metFORMIN (GLUCOPHAGE) 500 MG tablet; Take 2 tablets (1,000 mg total) by mouth 2 (two) times daily. -     Ambulatory referral to Endocrinology  Hyperlipidemia associated with type 2 diabetes mellitus ([place]) -     atorvastatin (LIPITOR) 40 MG tablet; Take 1 tablet (40 mg total) by mouth daily.  Essential hypertension -     amLODipine (NORVASC) 10 MG tablet; Take 1 tablet (10 mg total) by mouth daily.  Other orders -     insulin NPH Human (NOVOLIN N) 100 UNIT/ML injection; Inject 0.35 mLs (35 Units total) into the skin at bedtime. -     insulin regular (NOVOLIN R) 100 units/mL injection; Inject 0.2 mLs (20 Units total) into the skin 3 (three) times daily before meals. And syringes 4/day -     valsartan-hydrochlorothiazide (DIOVAN-HCT) 320-25 MG tablet; Take 1 tablet by mouth daily. -     metoprolol succinate (TOPROL-XL) 50 MG 24 hr tablet; Take 1 tablet (50 mg total) by mouth daily. Take with or immediately following a meal. -     Continuous Blood Gluc Sensor (FREESTYLE LIBRE 2 SENSOR) MISC; Use to monitor blood sugar     Past Medical History:  Diagnosis Date  . Diabetes mellitus without complication ([facility])    type 2   .  Hypertension   . Hypertensive urgency 06/07/2019  . Phimosis   . Superficial thrombosis of left lower extremity 06/24/2019     Family History  Problem Relation Age of Onset  . Diabetes Father   . Diabetes Paternal Uncle      Social History   Socioeconomic History  . Marital status: Married    Spouse name: Not on file  . Number of children: Not on file  . Years of education: Not on file  . Highest education level: Not on file  Occupational History  . Not on file  Tobacco Use  . Smoking status: Never Smoker  . Smokeless  tobacco: Never Used  Vaping Use  . Vaping Use: Never used  Substance and Sexual Activity  . Alcohol use: No  . Drug use: Not Currently    Types: Marijuana  . Sexual activity: Not on file  Other Topics Concern  . Not on file  Social History Narrative  . Not on file   Social Determinants of Health   Financial Resource Strain: Not on file  Food Insecurity: Not on file  Transportation Needs: Not on file  Physical Activity: Not on file  Stress: Not on file  Social Connections: Not on file  Intimate Partner Violence: Not on file     No Known Allergies   Outpatient Medications Prior to Visit  Medication Sig Dispense Refill  . amLODipine (NORVASC) 10 MG tablet Take 1 tablet (10 mg total) by mouth daily. 90 tablet 2  . atorvastatin (LIPITOR) 40 MG tablet Take 1 tablet (40 mg total) by mouth daily. 90 tablet 2  . BD INSULIN SYRINGE U/F 31G X 5/16" 0.3 ML MISC     . blood glucose meter kit and supplies KIT Dispense based on patient and insurance preference. Use up to four times daily as directed. (FOR ICD-9 250.00, 250.01). (Patient not taking: No sig reported) 1 each 0  . Continuous Blood Gluc Receiver (FREESTYLE LIBRE 2 READER) DEVI Use to monitor blood sugar 1 each 6  . Continuous Blood Gluc Sensor (FREESTYLE LIBRE 2 SENSOR) MISC Use to monitor blood sugar 6 each 3  . fluticasone (FLONASE) 50 MCG/ACT nasal spray Place 2 sprays into both nostrils daily. (Patient not taking: Reported on 12/26/2020) 11.1 mL 0  . ibuprofen (ADVIL) 600 MG tablet Take 1 tablet (600 mg total) by mouth every 8 (eight) hours as needed. (Patient not taking: No sig reported) 60 tablet 1  . insulin NPH Human (NOVOLIN N) 100 UNIT/ML injection Inject 0.35 mLs (35 Units total) into the skin at bedtime. 30 mL 3  . Insulin Pen Needle (TRUEPLUS 5-BEVEL PEN NEEDLES) 31G X 5 MM MISC USE AS DIRECTED FOR LANTUS 100 each 3  . insulin regular (NOVOLIN R) 100 units/mL injection Inject 0.2 mLs (20 Units total) into the skin 3  (three) times daily before meals. And syringes 4/day 20 mL 3  . metFORMIN (GLUCOPHAGE) 500 MG tablet Take 2 tablets (1,000 mg total) by mouth 2 (two) times daily. 120 tablet 2  . metoprolol succinate (TOPROL-XL) 50 MG 24 hr tablet Take 1 tablet (50 mg total) by mouth daily. Take with or immediately following a meal. 90 tablet 3  . valsartan-hydrochlorothiazide (DIOVAN-HCT) 320-25 MG tablet Take 1 tablet by mouth daily. 90 tablet 1   No facility-administered medications prior to visit.    Review of Systems  Constitutional: Negative for chills and fever.  HENT: Positive for dental problem. Negative for postnasal drip, rhinorrhea and sore throat.  Eyes: Negative.   Respiratory: Negative.   Gastrointestinal: Negative.   Endocrine: Positive for polydipsia and polyphagia.  Genitourinary: Negative for enuresis.  Musculoskeletal: Negative.   Skin: Negative.   Allergic/Immunologic: Negative.   Neurological: Positive for light-headedness.  Hematological: Negative.   Psychiatric/Behavioral: Negative.   r   Objective:   Physical ExamThere were no vitals taken for this visit. There were no vitals filed for this visit.  Gen: Pleasant, well-nourished, in no distress,  normal affect  ENT: No lesions,  mouth clear,  oropharynx clear, no postnasal drip, poor dentition  Neck: No JVD, no TMG, no carotid bruits  Lungs: No use of accessory muscles, no dullness to percussion, clear without rales or rhonchi  Cardiovascular: RRR, heart sounds normal, no murmur or gallops, no peripheral edema  Abdomen: soft and NT, no HSM,  BS normal  Musculoskeletal: No deformities, no cyanosis or clubbing  Neuro: alert, non focal  Skin: Warm, no lesions or rashes    CBC Latest Ref Rng & Units 12/02/2020 11/27/2020 07/04/2020  WBC 4.0 - 10.5 K/uL 5.9 10.4 10.8(H)  Hemoglobin 13.0 - 17.0 g/dL 16.9 17.9(H) 17.4(H)  Hematocrit 39.0 - 52.0 % 48.5 52.2(H) 50.0  Platelets 150 - 400 K/uL 119(L) 210 210   BMP Latest  Ref Rng & Units 12/02/2020 11/27/2020 07/04/2020  Glucose 70 - 99 mg/dL 380(H) 290(H) 430(H)  BUN 6 - 20 mg/dL '14 12 13  ' Creatinine 0.61 - 1.24 mg/dL 1.07 0.98 1.10  BUN/Creat Ratio 9 - 20 - - -  Sodium 135 - 145 mmol/L 133(L) 137 130(L)  Potassium 3.5 - 5.1 mmol/L 3.9 4.4 3.9  Chloride 98 - 111 mmol/L 95(L) 103 96(L)  CO2 22 - 32 mmol/L '27 25 25  ' Calcium 8.9 - 10.3 mg/dL 9.1 9.4 9.1  Lipid Panel     Component Value Date/Time   CHOL 169 06/24/2019 1052   TRIG 223 (H) 06/24/2019 1052   HDL 35 (L) 06/24/2019 1052   CHOLHDL 4.8 06/24/2019 1052   LDLCALC 89 06/24/2019 1052   Lab Results  Component Value Date   HGBA1C 11.1 (A) 12/26/2020   cbg 194     Assessment & Plan:  I personally reviewed all images and lab data in the [facility] system as well as any outside material available during this office visit and agree with the  radiology impressions.   No problem-specific Assessment & Plan notes found for this encounter. Recommended ateast 150 minutes of medium-intensity or high-intensity exercise each week.  Exercises may include brisk walking, biking, or water aerobics. Do stretching and strengthening exercises, such as yoga or weight lifting, at least 2 times a week. Educated on the benefits of exercising, including increasing muscle strength, bone density, and reducing body fat and stress. It also lowers and controls blood glucose. Advised to resume the use of your Dexcom meter to monitor blood sugars Referral to endocrinology to see Dr. Loanne Drilling Referral to the medical nutritionist  COVID Encouraged to get Covid booster shot  Recommanded waiting until mid to end of February since recently gotten Covid  f/u in 2wks to see Luck (clicnical pharmacist) f/u in face to face with Dr. Joya Gaskins in 6wks   There are no diagnoses linked to this encounter.    No problem-specific Assessment & Plan notes found for this encounter.   There are no diagnoses linked to this encounter.

## 2021-02-23 ENCOUNTER — Encounter: Payer: 59 | Attending: Critical Care Medicine | Admitting: Dietician

## 2021-03-28 ENCOUNTER — Ambulatory Visit: Payer: BC Managed Care – PPO | Admitting: Critical Care Medicine

## 2021-03-28 NOTE — Progress Notes (Deleted)
Subjective:    Patient ID: Anthony Hubbard, male    DOB: 1986-04-07, 35 y.o.   MRN: [id number]  History of Present Illness: 35 y.o.M with type 2 diabetes and hypertension and recent admission for left lower extremity cellulitis and abscess with need for drainage per orthopedics  This is a follow-up visit for hypertension type 2 diabetes and recent admission for left lower extremity cellulitis and abscess with drainage.  The patient's lower extremity is improving and wound care has been ongoing per orthopedics.  Recent notes from orthopedics suggest the wound is slowly healing.  The patient still on Keflex for antibiotic coverage of gram-positive cocci from the cultures.  The patient maintains the Lantus and also now on Trulicity.  Blood sugars have been ranging in the lower area of 1 20-1 40 at home.  The patient is due tetanus vaccine and flu shot.  Patient also needs a foot exam as well as microalbumin check in the urine.  Note the patient has still some pain particular with dressing changes on the wound.  11/17/2019 This is a follow-up visit from a September 2020 visit.  The patient is being treated for type 2 diabetes and hyperlipidemia.  Patient also had cellulitis and abscess of the left leg.  The cellulitis and abscess have resolved and the wound is healed.  The patient is now off antibiotics.  The patient is being followed for diabetes and hypertension.  Noted home he states his blood pressures in the 140/80 range.  His blood sugars have been in the 1 20-1 40 range fasting and then get up to mid 200s postprandial  He has not been compliant with his diet.  Note today's hemoglobin A1c is 11 and his CBG is 260  The patient tends to eat a lot in different times and does not take snacks that are healthy in for him between meals but instead eats unhealthy snacks  01/12/2020 The patient returns today for follow-up and continues to run high sugars although not as high as previous visits.  His  fastings were in the 200 range postprandial 300.  Previously he was fasting in the 450 range.  He is not been able to access his NovoLog due to insurance barriers.  He is on the Lantus at 55 units daily and is on Metformin at 1000 mg twice daily as well now is on Ozempic 0.5 weekly  Blood pressure showing some improved control  We have been giving the patient counseling with regards to his diet   There are no other new complaints   03/29/2020 This patient is seen as a work in visit as he had gone to the ER recently with hyperglycemia again.  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  Initially was working the first shift going in at 4:30 in the morning getting off at 2:30 in the afternoon in the situation he would skip his breakfast.  At other times he is shifted over to an afternoon shift going into work at 2:30 PM getting off at 10:30 PM in that circumstance he found himself skipping dinner or going out and having fast food.  He states normally his blood sugars are 120 fasting and 180 after eating more recently they have been increasing since eating more fast food and skipping meals  He is to be on Lantus 15 units daily Ozempic 1 injection weekly and NovoLog 11 units 3  times a day with meals but he has been skipping meals  05/03/2020 Patient seen today in return follow-up blood sugars have been improved he typically runs in the 160s in the middle of the day preprandial 180s postprandial sometimes overnight he will run in the 160s as well.  Very few blood glucoses are over 200 these days compared to prior.  He did receive his first Covid vaccine which was a Estate manager/land agent vaccine recently.  He has no new complaints at this time.  He is working at [employer] in [employer] and works from 3-11  His dietary habits have improved however he occasionally skips dinner The patient's diabetes protocol is as below  however he occasionally skips the evening dose of NovoLog when he does not eat dinner Increase lantus to 20units daily Increase ozempic to 16m weekly Cont tid novolog 11 units with meals Blood pressure has been trending up and today is 147/107 on arrival he is on the amlodipine 10 mg a day losartan 100 mg a day and HCTZ 12.5 daily  The patient does struggle with poor dentition and has carious teeth in the molar distribution in his right lower jaw that need removal  06/30/2020 This patient is seen today in the mobile health clinic as a walk-in.  He works in a wPhysiological scientistand extreme temperatures.  He had an episode of syncope yesterday.  His blood sugar has been running in 3-400 range recently.  When I ask him what type of diet he has these been eating a lot of peanut butter sandwiches and smoothies with high sugar content.  He has also been skipping lunch.  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 The patient states he has been compliant with his insulin but has not been taking the midday insulin dose. On arrival the patient's blood sugar was 417 after  10 units of regular NovoLog it was down to 410.  The patient states he has been trying to keep up with drinking plenty of water while on the job.   Note on arrival also his blood pressure is 153/99 The patient brings with him a job restriction accommodation form to be filled out and he was asked by his employer to take a week off  to improve his health status.  07/04/2020 This patient is seen as a work in when he went to the emergency room yesterday spent 9 hours and then left that being seen.  At that time lab data did show hyponatremia blood sugar of 417 significant volume depletion negative troponin negative EKG otherwise unremarkable lab data  He states this morning his blood sugars were  in the upper 200s on arrival today is 383.  His girlfriend states he is not been eating meals on a regular basis and drinking beer he states he is eating meals somehow he is not being transparent with our team on this information.  09/22/2020 Is a 35year old male seen in follow-up for type 2 diabetes poorly controlled.  This patient is improving with his glycemic control.  He has more values in the 100 range.  On arrival today is 194.  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  Patient on arrival his blood pressure 130/89.  Patient is try to be more compliant with exercise and his diet.  Patient is fully vaccinated with Covid.  He does need hepatitis C screen. Patient is yet to see a dentist he has numerous carious teeth poor dentition and periodontal disease  10/27/20: Phone visit T2DM.   This patient is seen by way of a telephone visit as he was not able to connect through the video.  Patient now has a new position no longer working for [employer].  He is seeking a letter stating he was not able to go back in the warehouse to work.  He was helping get a position in the lab but he was not able to achieve that.  Patient notes recently his a.m. blood sugars are 130 and afternoon at 4 PM 110 and with this occasionally gets a little dizziness.  He now has a sensor and can check his blood glucose almost any time.  His weight is at 238 pounds.  He does not have a blood pressure monitor yet available.  He is yet to receive a flu vaccine.  The patient has received his Covid vaccines and is now due a booster  there are no other complaints  The patient states he now has a new job and he works for [employer] as a Geophysicist/field seismologist and he is doing better with this particular job  from a medical perspective  12/26/20 HPI Eva is a 35 y.o M  With PMH significant for uncontrolled T2DM and HTN. The patient is seen today for a f/u post covid visit. Diagnosed with Covid on 11/27/2020 and treated for hyperglycemic in the ED on 12/03/20. Reports  Increased polydipsia and polyphagia since covid. Denies polyuria. Reports fasting blood glucose since covid ranges from 140s-150s, and postprandial glucose ranges highs 200s-300s. Daily dietary intake consists of ham sandwiches and soup. 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 States that he is currently out of his Novolin R.  03/28/2021  Essential hypertension Begin valsartan HCT 1 daily. This is a combination medicine to help control your blood pressure better. Continue metoprolol and amlodipine as prescribed  Hyperlipidemia associated with type 2 diabetes mellitus ([place]) Continue taking atorvastatin. Discontinue losartan and hydrochlorothiazide  Type 2 diabetes mellitus with hyperglycemia, with long-term current use of insulin (HCC) Increase Novolin R to 20 units 3 times daily with meals Increase Novolin N to 35 units at bedtime Continue Metformin as prescribed Recommended ateast 150 minutes of medium-intensity or high-intensity exercise each week.  Exercises may include brisk walking, biking, or water aerobics. Do stretching and strengthening exercises, such as yoga or weight lifting, at least 2 times a week. Educated on the benefits of exercising, including increasing muscle strength, bone density, and reducing body fat and stress. It also lowers and controls blood glucose. Advised to resume the use of your Dexcom meter to monitor blood sugars Referral to endocrinology to see Dr. Loanne Drilling Referral to the medical nutritionist  COVID Encouraged to get Covid booster shot  Recommanded waiting until mid to end of February since recently gotten Covid  f/u in 2wks to see Luck (clicnical pharmacist) f/u in face to face with  Dr. Joya Gaskins in [place] was seen today for follow-up and diabetes.  Diagnoses and all orders for this visit:  Type 2 diabetes mellitus with  hyperglycemia, with long-term current use of insulin (HCC) -     Glucose (CBG) -     HgB A1c -     Amb ref to Medical Nutrition Therapy-MNT -     metFORMIN (GLUCOPHAGE) 500 MG tablet; Take 2 tablets (1,000 mg total) by mouth 2 (two) times daily. -     Ambulatory referral to Endocrinology  Hyperlipidemia associated with type 2 diabetes mellitus ([place]) -     atorvastatin (LIPITOR) 40 MG tablet; Take 1 tablet (40 mg total) by mouth daily.  Essential hypertension -     amLODipine (NORVASC) 10 MG tablet; Take 1 tablet (10 mg total) by mouth daily.  Other orders -     insulin NPH Human (NOVOLIN N) 100 UNIT/ML injection; Inject 0.35 mLs (35 Units total) into the skin at bedtime. -     insulin regular (NOVOLIN R) 100 units/mL injection; Inject 0.2 mLs (20 Units total) into the skin 3 (three) times daily before meals. And syringes 4/day -     valsartan-hydrochlorothiazide (DIOVAN-HCT) 320-25 MG tablet; Take 1 tablet by mouth daily. -     metoprolol succinate (TOPROL-XL) 50 MG 24 hr tablet; Take 1 tablet (50 mg total) by mouth daily. Take with or immediately following a meal. -     Continuous Blood Gluc Sensor (FREESTYLE LIBRE 2 SENSOR) MISC; Use to monitor blood sugar      Essential hypertension Begin valsartan HCT 1 daily. This is a combination medicine to help control your blood pressure better. Continue metoprolol and amlodipine as prescribed  Hyperlipidemia associated with type 2 diabetes mellitus ([place]) Continue taking atorvastatin. Discontinue losartan and hydrochlorothiazide  Type 2 diabetes mellitus with hyperglycemia, with long-term current use of insulin (HCC) Increase Novolin R to 20 units 3 times daily with meals Increase Novolin N to 35 units at bedtime Continue Metformin as prescribed   Mehul was seen today for follow-up and  diabetes.  Diagnoses and all orders for this visit:  Type 2 diabetes mellitus with hyperglycemia, with long-term current use of insulin (HCC) -     Glucose (CBG) -     HgB A1c -     Amb ref to Medical Nutrition Therapy-MNT -     metFORMIN (GLUCOPHAGE) 500 MG tablet; Take 2 tablets (1,000 mg total) by mouth 2 (two) times daily. -     Ambulatory referral to Endocrinology  Hyperlipidemia associated with type 2 diabetes mellitus ([place]) -     atorvastatin (LIPITOR) 40 MG tablet; Take 1 tablet (40 mg total) by mouth daily.  Essential hypertension -     amLODipine (NORVASC) 10 MG tablet; Take 1 tablet (10 mg total) by mouth daily.  Other orders -     insulin NPH Human (NOVOLIN N) 100 UNIT/ML injection; Inject 0.35 mLs (35 Units total) into the skin at bedtime. -     insulin regular (NOVOLIN R) 100 units/mL injection; Inject 0.2 mLs (20 Units total) into the skin 3 (three) times daily before meals. And syringes 4/day -     valsartan-hydrochlorothiazide (DIOVAN-HCT) 320-25 MG tablet; Take 1 tablet by mouth daily. -     metoprolol succinate (TOPROL-XL) 50 MG 24 hr tablet; Take 1 tablet (50 mg total) by mouth daily. Take with or immediately following a meal. -     Continuous Blood Gluc Sensor (FREESTYLE LIBRE 2 SENSOR) MISC; Use to monitor blood sugar     Past Medical History:  Diagnosis Date  . Diabetes mellitus without complication ([place])    type 2   .  Hypertension   . Hypertensive urgency 06/07/2019  . Phimosis   . Superficial thrombosis of left lower extremity 06/24/2019     Family History  Problem Relation Age of Onset  . Diabetes Father   . Diabetes Paternal Uncle      Social History   Socioeconomic History  . Marital status: Married    Spouse name: Not on file  . Number of children: Not on file  . Years of education: Not on file  . Highest education level: Not on file  Occupational History  . Not on file  Tobacco Use  . Smoking status: Never Smoker  . Smokeless tobacco:  Never Used  Vaping Use  . Vaping Use: Never used  Substance and Sexual Activity  . Alcohol use: No  . Drug use: Not Currently    Types: Marijuana  . Sexual activity: Not on file  Other Topics Concern  . Not on file  Social History Narrative  . Not on file   Social Determinants of Health   Financial Resource Strain: Not on file  Food Insecurity: Not on file  Transportation Needs: Not on file  Physical Activity: Not on file  Stress: Not on file  Social Connections: Not on file  Intimate Partner Violence: Not on file     No Known Allergies   Outpatient Medications Prior to Visit  Medication Sig Dispense Refill  . amLODipine (NORVASC) 10 MG tablet Take 1 tablet (10 mg total) by mouth daily. 90 tablet 2  . atorvastatin (LIPITOR) 40 MG tablet Take 1 tablet (40 mg total) by mouth daily. 90 tablet 2  . BD INSULIN SYRINGE U/F 31G X 5/16" 0.3 ML MISC     . blood glucose meter kit and supplies KIT Dispense based on patient and insurance preference. Use up to four times daily as directed. (FOR ICD-9 250.00, 250.01). (Patient not taking: No sig reported) 1 each 0  . Continuous Blood Gluc Receiver (FREESTYLE LIBRE 2 READER) DEVI Use to monitor blood sugar 1 each 6  . Continuous Blood Gluc Sensor (FREESTYLE LIBRE 2 SENSOR) MISC Use to monitor blood sugar 6 each 3  . fluticasone (FLONASE) 50 MCG/ACT nasal spray Place 2 sprays into both nostrils daily. (Patient not taking: Reported on 12/26/2020) 11.1 mL 0  . ibuprofen (ADVIL) 600 MG tablet Take 1 tablet (600 mg total) by mouth every 8 (eight) hours as needed. (Patient not taking: No sig reported) 60 tablet 1  . insulin NPH Human (NOVOLIN N) 100 UNIT/ML injection Inject 0.35 mLs (35 Units total) into the skin at bedtime. 30 mL 3  . Insulin Pen Needle (TRUEPLUS 5-BEVEL PEN NEEDLES) 31G X 5 MM MISC USE AS DIRECTED FOR LANTUS 100 each 3  . insulin regular (NOVOLIN R) 100 units/mL injection Inject 0.2 mLs (20 Units total) into the skin 3 (three)  times daily before meals. And syringes 4/day 20 mL 3  . metFORMIN (GLUCOPHAGE) 500 MG tablet Take 2 tablets (1,000 mg total) by mouth 2 (two) times daily. 120 tablet 2  . metoprolol succinate (TOPROL-XL) 50 MG 24 hr tablet Take 1 tablet (50 mg total) by mouth daily. Take with or immediately following a meal. 90 tablet 3  . valsartan-hydrochlorothiazide (DIOVAN-HCT) 320-25 MG tablet Take 1 tablet by mouth daily. 90 tablet 1   No facility-administered medications prior to visit.    Review of Systems  Constitutional: Negative for chills and fever.  HENT: Positive for dental problem. Negative for postnasal drip, rhinorrhea and sore throat.  Eyes: Negative.   Respiratory: Negative.   Gastrointestinal: Negative.   Endocrine: Positive for polydipsia and polyphagia.  Genitourinary: Negative for enuresis.  Musculoskeletal: Negative.   Skin: Negative.   Allergic/Immunologic: Negative.   Neurological: Positive for light-headedness.  Hematological: Negative.   Psychiatric/Behavioral: Negative.   r   Objective:   Physical ExamThere were no vitals taken for this visit. There were no vitals filed for this visit.  Gen: Pleasant, well-nourished, in no distress,  normal affect  ENT: No lesions,  mouth clear,  oropharynx clear, no postnasal drip, poor dentition  Neck: No JVD, no TMG, no carotid bruits  Lungs: No use of accessory muscles, no dullness to percussion, clear without rales or rhonchi  Cardiovascular: RRR, heart sounds normal, no murmur or gallops, no peripheral edema  Abdomen: soft and NT, no HSM,  BS normal  Musculoskeletal: No deformities, no cyanosis or clubbing  Neuro: alert, non focal  Skin: Warm, no lesions or rashes    CBC Latest Ref Rng & Units 12/02/2020 11/27/2020 07/04/2020  WBC 4.0 - 10.5 K/uL 5.9 10.4 10.8(H)  Hemoglobin 13.0 - 17.0 g/dL 16.9 17.9(H) 17.4(H)  Hematocrit 39.0 - 52.0 % 48.5 52.2(H) 50.0  Platelets 150 - 400 K/uL 119(L) 210 210   BMP Latest Ref Rng &  Units 12/02/2020 11/27/2020 07/04/2020  Glucose 70 - 99 mg/dL 380(H) 290(H) 430(H)  BUN 6 - 20 mg/dL '14 12 13  ' Creatinine 0.61 - 1.24 mg/dL 1.07 0.98 1.10  BUN/Creat Ratio 9 - 20 - - -  Sodium 135 - 145 mmol/L 133(L) 137 130(L)  Potassium 3.5 - 5.1 mmol/L 3.9 4.4 3.9  Chloride 98 - 111 mmol/L 95(L) 103 96(L)  CO2 22 - 32 mmol/L '27 25 25  ' Calcium 8.9 - 10.3 mg/dL 9.1 9.4 9.1  Lipid Panel     Component Value Date/Time   CHOL 169 06/24/2019 1052   TRIG 223 (H) 06/24/2019 1052   HDL 35 (L) 06/24/2019 1052   CHOLHDL 4.8 06/24/2019 1052   LDLCALC 89 06/24/2019 1052   Lab Results  Component Value Date   HGBA1C 11.1 (A) 12/26/2020   cbg 194     Assessment & Plan:  I personally reviewed all images and lab data in the [facility] system as well as any outside material available during this office visit and agree with the  radiology impressions.   No problem-specific Assessment & Plan notes found for this encounter. Recommended ateast 150 minutes of medium-intensity or high-intensity exercise each week.  Exercises may include brisk walking, biking, or water aerobics. Do stretching and strengthening exercises, such as yoga or weight lifting, at least 2 times a week. Educated on the benefits of exercising, including increasing muscle strength, bone density, and reducing body fat and stress. It also lowers and controls blood glucose. Advised to resume the use of your Dexcom meter to monitor blood sugars Referral to endocrinology to see Dr. Loanne Drilling Referral to the medical nutritionist  COVID Encouraged to get Covid booster shot  Recommanded waiting until mid to end of February since recently gotten Covid  f/u in 2wks to see Luck (clicnical pharmacist) f/u in face to face with Dr. Joya Gaskins in 6wks   There are no diagnoses linked to this encounter.    No problem-specific Assessment & Plan notes found for this encounter.   There are no diagnoses linked to this encounter.

## 2021-07-20 ENCOUNTER — Other Ambulatory Visit: Payer: Self-pay

## 2021-07-20 ENCOUNTER — Ambulatory Visit: Payer: Self-pay | Attending: Physician Assistant | Admitting: Physician Assistant

## 2021-07-20 ENCOUNTER — Encounter: Payer: Self-pay | Admitting: Physician Assistant

## 2021-07-20 VITALS — BP 149/89 | HR 73 | Ht 70.0 in | Wt 240.0 lb

## 2021-07-20 DIAGNOSIS — E1165 Type 2 diabetes mellitus with hyperglycemia: Secondary | ICD-10-CM

## 2021-07-20 DIAGNOSIS — I10 Essential (primary) hypertension: Secondary | ICD-10-CM

## 2021-07-20 DIAGNOSIS — Z9119 Patient's noncompliance with other medical treatment and regimen: Secondary | ICD-10-CM

## 2021-07-20 DIAGNOSIS — Z91199 Patient's noncompliance with other medical treatment and regimen due to unspecified reason: Secondary | ICD-10-CM

## 2021-07-20 DIAGNOSIS — Z794 Long term (current) use of insulin: Secondary | ICD-10-CM

## 2021-07-20 DIAGNOSIS — E1169 Type 2 diabetes mellitus with other specified complication: Secondary | ICD-10-CM

## 2021-07-20 DIAGNOSIS — E785 Hyperlipidemia, unspecified: Secondary | ICD-10-CM

## 2021-07-20 LAB — GLUCOSE, POCT (MANUAL RESULT ENTRY): POC Glucose: 318 mg/dl — AB (ref 70–99)

## 2021-07-20 LAB — POCT GLYCOSYLATED HEMOGLOBIN (HGB A1C): HbA1c, POC (controlled diabetic range): 10.6 % — AB (ref 0.0–7.0)

## 2021-07-20 MED ORDER — ATORVASTATIN CALCIUM 40 MG PO TABS
40.0000 mg | ORAL_TABLET | Freq: Every day | ORAL | 2 refills | Status: DC
  Filled 2021-07-20: qty 30, 30d supply, fill #0

## 2021-07-20 MED ORDER — AMLODIPINE BESYLATE 10 MG PO TABS
10.0000 mg | ORAL_TABLET | Freq: Every day | ORAL | 2 refills | Status: DC
  Filled 2021-07-20: qty 30, 30d supply, fill #0

## 2021-07-20 MED ORDER — METFORMIN HCL 500 MG PO TABS
1000.0000 mg | ORAL_TABLET | Freq: Two times a day (BID) | ORAL | 2 refills | Status: DC
Start: 2021-07-20 — End: 2021-10-23
  Filled 2021-07-20: qty 120, 30d supply, fill #0

## 2021-07-20 MED ORDER — VALSARTAN-HYDROCHLOROTHIAZIDE 320-25 MG PO TABS
1.0000 | ORAL_TABLET | Freq: Every day | ORAL | 1 refills | Status: DC
Start: 2021-07-20 — End: 2021-10-23
  Filled 2021-07-20: qty 30, 30d supply, fill #0

## 2021-07-20 MED ORDER — TRUEPLUS PEN NEEDLES 31G X 5 MM MISC
3 refills | Status: DC
  Filled 2021-07-20: qty 100, 25d supply, fill #0

## 2021-07-20 MED ORDER — LANCETS MISC
1.0000 | Freq: Two times a day (BID) | 1 refills | Status: AC
  Filled 2021-07-20: qty 100, fill #0

## 2021-07-20 MED ORDER — BASAGLAR KWIKPEN 100 UNIT/ML ~~LOC~~ SOPN
25.0000 [IU] | PEN_INJECTOR | Freq: Every day | SUBCUTANEOUS | 3 refills | Status: DC
Start: 2021-07-20 — End: 2021-10-23
  Filled 2021-07-20: qty 6, 24d supply, fill #0

## 2021-07-20 MED ORDER — METOPROLOL SUCCINATE ER 50 MG PO TB24
50.0000 mg | ORAL_TABLET | Freq: Every day | ORAL | 3 refills | Status: DC
  Filled 2021-07-20: qty 30, 30d supply, fill #0

## 2021-07-20 NOTE — Patient Instructions (Addendum)
Check blood sugars fasting and bed time and bring to next appt.     Work to eliminate sugary drinks, candy, desserts, sweets, refined sugars, processed foods, and white carbohydrates.    Drink 80-100 ounces water daily    Lipoma  A lipoma is a noncancerous (benign) tumor that is made up of fat cells. This is a very common type of soft-tissue growth. Lipomas are usually found under the skin (subcutaneous). They may occur in any tissue of the body that contains fat. Common areas forlipomas to appear include the back, arms, shoulders, buttocks, and thighs. Lipomas grow slowly, and they are usually painless. Most lipomas do not causeproblems and do not require treatment. What are the causes? The cause of this condition is not known. What increases the risk? You are more likely to develop this condition if: You are 31-68 years old. You have a family history of lipomas. What are the signs or symptoms? A lipoma usually appears as a small, round bump under the skin. In most cases, the lump will: Feel soft or rubbery. Not cause pain or other symptoms. However, if a lipoma is located in an area where it pushes on nerves, it canbecome painful or cause other symptoms. How is this diagnosed? A lipoma can usually be diagnosed with a physical exam. You may also have tests to confirm the diagnosis and to rule out other conditions. Tests may include: Imaging tests, such as a CT scan or an MRI. Removal of a tissue sample to be looked at under a microscope (biopsy). How is this treated? Treatment for this condition depends on the size of the lipoma and whether it is causing any symptoms. For small lipomas that are not causing problems, no treatment is needed. If a lipoma is bigger or it causes problems, surgery may be done to remove the lipoma. Lipomas can also be removed to improve appearance. Most often, the procedure is done after applying a medicine that numbs the area (local anesthetic). Liposuction  may be done to reduce the size of the lipoma before it is removed through surgery, or it may be done to remove the lipoma. Lipomas are removed with this method in order to limit incision size and scarring. A liposuction tube is inserted through a small incision into the lipoma, and the contents of the lipoma are removed through the tube with suction. Follow these instructions at home: Watch your lipoma for any changes. Keep all follow-up visits as told by your health care provider. This is important. Contact a health care provider if: Your lipoma becomes larger or hard. Your lipoma becomes painful, red, or increasingly swollen. These could be signs of infection or a more serious condition. Get help right away if: You develop tingling or numbness in an area near the lipoma. This could indicate that the lipoma is causing nerve damage. Summary A lipoma is a noncancerous tumor that is made up of fat cells. Most lipomas do not cause problems and do not require treatment. If a lipoma is bigger or it causes problems, surgery may be done to remove the lipoma. Contact a health care provider if your lipoma becomes larger or hard, or if it becomes painful, red, or increasingly swollen. Pain, redness, and swelling could be signs of infection or a more serious condition. This information is not intended to replace advice given to you by your health care provider. Make sure you discuss any questions you have with your healthcare provider. Document Revised: 06/29/2019 Document Reviewed: 06/29/2019 Elsevier  Elsevier Patient Education  2022 [employer]  

## 2021-07-20 NOTE — Progress Notes (Signed)
Needs refills on all meds 

## 2021-07-20 NOTE — Progress Notes (Signed)
Patient ID: Anthony Hubbard, male   DOB: Nov 12, 1986, 35 y.o.   MRN: [id number]   Anthony Hubbard, is a 35 y.o. male  [id number]  [id number]  DOB - 12/30/1985  No chief complaint on file.      Subjective:   Anthony Hubbard is a 35 y.o. male here today for med RF.  He has been out of meds for about 2 months.  Does not check glucose regularly.  Supposed to be on novolog N and R as his current regimen.  Admits that tid insulin is hard to be compliant with.  When he was on his meds, he says his blood sugars were 140-low 200s.  No problems updated.  ALLERGIES: No Known Allergies  PAST MEDICAL HISTORY: Past Medical History:  Diagnosis Date   Diabetes mellitus without complication ([place])    type 2    Hypertension    Hypertensive urgency 06/07/2019   Phimosis    Superficial thrombosis of left lower extremity 06/24/2019    MEDICATIONS AT HOME: Prior to Admission medications   Medication Sig Start Date End Date Taking? Authorizing Provider  BD INSULIN SYRINGE U/F 31G X 5/16" 0.3 ML MISC  09/11/20  Yes [provider]  blood glucose meter kit and supplies KIT Dispense based on patient and insurance preference. Use up to four times daily as directed. (FOR ICD-9 250.00, 250.01). 06/15/19  Yes Kathie Dike, MD  Continuous Blood Gluc Receiver (FREESTYLE LIBRE 2 READER) DEVI Use to monitor blood sugar 03/29/20  Yes Elsie Stain, MD  Continuous Blood Gluc Sensor (FREESTYLE LIBRE 2 SENSOR) MISC Use to monitor blood sugar 12/26/20  Yes Elsie Stain, MD  Insulin Glargine [facility] KWIKPEN) 100 UNIT/ML Inject 25 Units into the skin daily. 07/20/21  Yes Argentina Donovan, PA-C  Lancets MISC 1 each by Does not apply route 2 (two) times daily. 07/20/21  Yes Freeman Caldron M, PA-C  amLODipine (NORVASC) 10 MG tablet Take 1 tablet (10 mg total) by mouth daily. 07/20/21   Argentina Donovan, PA-C  atorvastatin (LIPITOR) 40 MG tablet Take 1 tablet (40 mg total) by mouth daily.  07/20/21   Argentina Donovan, PA-C  fluticasone (FLONASE) 50 MCG/ACT nasal spray Place 2 sprays into both nostrils daily. Patient not taking: No sig reported 11/27/20   Henderly, Britni A, PA-C  ibuprofen (ADVIL) 600 MG tablet Take 1 tablet (600 mg total) by mouth every 8 (eight) hours as needed. Patient not taking: No sig reported 07/15/19   Elsie Stain, MD  Insulin Pen Needle (TRUEPLUS PEN NEEDLES) 31G X 5 MM MISC use as directed with insulin 07/20/21   Argentina Donovan, PA-C  metFORMIN (GLUCOPHAGE) 500 MG tablet Take 2 tablets (1,000 mg total) by mouth 2 (two) times daily. 07/20/21   Argentina Donovan, PA-C  metoprolol succinate (TOPROL-XL) 50 MG 24 hr tablet Take 1 tablet (50 mg total) by mouth daily. Take with or immediately following a meal. 07/20/21   Kerissa Coia, Dionne Bucy, PA-C  valsartan-hydrochlorothiazide (DIOVAN-HCT) 320-25 MG tablet Take 1 tablet by mouth daily. 07/20/21   Argentina Donovan, PA-C    ROS: Neg HEENT Neg resp Neg cardiac Neg GI Neg GU Neg MS Neg psych Neg neuro  Objective:   Vitals:   07/20/21 1118  BP: (!) 149/89  Pulse: 73  SpO2: 99%  Weight: 240 lb (108.9 kg)  Height: _0  (1.778 m)   Exam General appearance : Awake, alert, not in any distress. Speech Clear. Not toxic looking HEENT:  Atraumatic and Normocephalic, pupils equally reactive to light and accomodation Neck: Supple, no JVD. No cervical lymphadenopathy.  Chest: Good air entry bilaterally, CTAB.  No rales/rhonchi/wheezing CVS: S1 S2 regular, no murmurs.  Extremities: B/L Lower Ext shows no edema, both legs are warm to touch Neurology: Awake alert, and oriented X 3, CN II-XII intact, Non focal Skin: No Rash  Data Review Lab Results  Component Value Date   HGBA1C 10.6 (A) 07/20/2021   HGBA1C 11.1 (A) 12/26/2020   HGBA1C 10.2 (A) 09/01/2020    Assessment & Plan   1. Type 2 diabetes mellitus with hyperglycemia, with long-term current use of insulin (HCC) Uncontrolled/out of meds and  feels previous regimen too difficult to keep up with. I am going to change him to basaglar and have him continue metformin.  Check blood sugars at least fasting and bedtime and follow up with Lurena Joiner in 3 weeks - Glucose (CBG) - HgB A1c - Comprehensive metabolic panel - CBC with Differential/Platelet - metFORMIN (GLUCOPHAGE) 500 MG tablet; Take 2 tablets (1,000 mg total) by mouth 2 (two) times daily.  Dispense: 120 tablet; Refill: 2 - Insulin Glargine (BASAGLAR KWIKPEN) 100 UNIT/ML; Inject 25 Units into the skin daily.  Dispense: 15 mL; Refill: 3 - Insulin Pen Needle (TRUEPLUS PEN NEEDLES) 31G X 5 MM MISC; use as directed with insulin  Dispense: 100 each; Refill: 3 - Lancets MISC; 1 each by Does not apply route 2 (two) times daily.  Dispense: 100 each; Refill: 1 - Lipid panel  2. Essential hypertension Resume meds - Comprehensive metabolic panel - amLODipine (NORVASC) 10 MG tablet; Take 1 tablet (10 mg total) by mouth daily.  Dispense: 90 tablet; Refill: 2 - valsartan-hydrochlorothiazide (DIOVAN-HCT) 320-25 MG tablet; Take 1 tablet by mouth daily.  Dispense: 90 tablet; Refill: 1 - metoprolol succinate (TOPROL-XL) 50 MG 24 hr tablet; Take 1 tablet (50 mg total) by mouth daily. Take with or immediately following a meal.  Dispense: 90 tablet; Refill: 3  3. Hyperlipidemia associated with type 2 diabetes mellitus (HCC) - Comprehensive metabolic panel - atorvastatin (LIPITOR) 40 MG tablet; Take 1 tablet (40 mg total) by mouth daily.  Dispense: 90 tablet; Refill: 2  4. Poor compliance Compliance imperative.      Patient have been counseled extensively about nutrition and exercise. Other issues discussed during this visit include: low cholesterol diet, weight control and daily exercise, foot care, annual eye examinations at Ophthalmology, importance of adherence with medications and regular follow-up. We also discussed long term complications of uncontrolled diabetes and hypertension.   Return for  [facility] in 3 weeks for DM;  Dr Joya Gaskins in 3 months.  The patient was given clear instructions to go to ER or return to medical center if symptoms don't improve, worsen or new problems develop. The patient verbalized understanding. The patient was told to call to get lab results if they haven't heard anything in the next week.      Freeman Caldron, PA-C [facility] and [facility] [place], [place]   07/20/2021, 1:34 PM

## 2021-07-21 LAB — CBC WITH DIFFERENTIAL/PLATELET
Basophils Absolute: 0.1 10*3/uL (ref 0.0–0.2)
Basos: 1 %
EOS (ABSOLUTE): 0.2 10*3/uL (ref 0.0–0.4)
Eos: 2 %
Hematocrit: 49.7 % (ref 37.5–51.0)
Hemoglobin: 16.9 g/dL (ref 13.0–17.7)
Immature Grans (Abs): 0 10*3/uL (ref 0.0–0.1)
Immature Granulocytes: 0 %
Lymphocytes Absolute: 3.1 10*3/uL (ref 0.7–3.1)
Lymphs: 36 %
MCH: 29.2 pg (ref 26.6–33.0)
MCHC: 34 g/dL (ref 31.5–35.7)
MCV: 86 fL (ref 79–97)
Monocytes Absolute: 0.6 10*3/uL (ref 0.1–0.9)
Monocytes: 7 %
Neutrophils Absolute: 4.6 10*3/uL (ref 1.4–7.0)
Neutrophils: 54 %
Platelets: 181 10*3/uL (ref 150–450)
RBC: 5.79 x10E6/uL (ref 4.14–5.80)
RDW: 12.4 % (ref 11.6–15.4)
WBC: 8.5 10*3/uL (ref 3.4–10.8)

## 2021-07-21 LAB — COMPREHENSIVE METABOLIC PANEL
ALT: 22 IU/L (ref 0–44)
AST: 14 IU/L (ref 0–40)
Albumin/Globulin Ratio: 1.8 (ref 1.2–2.2)
Albumin: 4.5 g/dL (ref 4.0–5.0)
Alkaline Phosphatase: 68 IU/L (ref 44–121)
BUN/Creatinine Ratio: 13 (ref 9–20)
BUN: 14 mg/dL (ref 6–20)
Bilirubin Total: 0.4 mg/dL (ref 0.0–1.2)
CO2: 23 mmol/L (ref 20–29)
Calcium: 10 mg/dL (ref 8.7–10.2)
Chloride: 98 mmol/L (ref 96–106)
Creatinine, Ser: 1.1 mg/dL (ref 0.76–1.27)
Globulin, Total: 2.5 g/dL (ref 1.5–4.5)
Glucose: 299 mg/dL — ABNORMAL HIGH (ref 65–99)
Potassium: 5.2 mmol/L (ref 3.5–5.2)
Sodium: 138 mmol/L (ref 134–144)
Total Protein: 7 g/dL (ref 6.0–8.5)
eGFR: 91 mL/min/{1.73_m2} (ref >59)

## 2021-07-21 LAB — LIPID PANEL
Chol/HDL Ratio: 5.6 ratio — ABNORMAL HIGH (ref 0.0–5.0)
Cholesterol, Total: 201 mg/dL — ABNORMAL HIGH (ref 100–199)
HDL: 36 mg/dL — ABNORMAL LOW (ref >39)
LDL Chol Calc (NIH): 102 mg/dL — ABNORMAL HIGH (ref 0–99)
Triglycerides: 372 mg/dL — ABNORMAL HIGH (ref 0–149)
VLDL Cholesterol Cal: 63 mg/dL — ABNORMAL HIGH (ref 5–40)

## 2021-07-25 ENCOUNTER — Ambulatory Visit: Payer: 59 | Admitting: Critical Care Medicine

## 2021-08-11 ENCOUNTER — Ambulatory Visit: Payer: Self-pay | Admitting: Pharmacist

## 2021-10-05 ENCOUNTER — Telehealth (INDEPENDENT_AMBULATORY_CARE_PROVIDER_SITE_OTHER): Payer: Self-pay

## 2021-10-05 NOTE — Telephone Encounter (Signed)
Patient has refills available in our pharmacy.

## 2021-10-05 NOTE — Telephone Encounter (Signed)
Copied from CRM [id number]. Topic: General - Other >> Oct 03, 2021 11:31 AM Jaquita Rector A wrote: Reason for CRM: Patient called in needing to reschedule his appointment with [name] missed on 08/11/21 stated that he is out of insulin and the last insulin that was prescribed to him did not work well for him asking for a call back today.   Patient was called and scheduled for Nov 22 @2pm . Patient would like a refill to be sent to pharmacy stating he has been out for a week.  please at Ph# [phone]

## 2021-10-16 NOTE — Progress Notes (Unsigned)
    S:     No chief complaint on file.   Patient arrives ***.  Presents for diabetes evaluation, education, and management. Patient was referred and last seen by Primary Care Provider Georgian Co on 07/20/2021 where the patient reported being out of medications for 2 months and having adherence issues. Patient was swapped to Basaglar and metformin. Patient was scheduled to see pharmacy sooner, however patient did not show to the previous appointment.  Patient reports Diabetes was diagnosed in ***.   Family/Social History:  -Never smoker -Father: diabetes  Insurance coverage/medication affordability: ***  Medication adherence reported *** .   Current diabetes medications include:  -Metformin 1000 mg BID -Basaglar 25 mg QD Current hypertension medications include:  -Amlodipine 10 mg QD -Valsartan-hctz 320-25 mg QD -Toprol XL 50 mg QD  Current hyperlipidemia medications include:  -Atorvastatin 40 mg QD  Patient {Actions; denies-reports:120008} hypoglycemic events.  Patient reported dietary habits: Eats *** meals/day Breakfast:*** Lunch:*** Dinner:*** Snacks:*** Drinks:***  Patient-reported exercise habits: ***   Patient {Actions; denies-reports:120008} nocturia (nighttime urination).  Patient {Actions; denies-reports:120008} neuropathy (nerve pain). Patient {Actions; denies-reports:120008} visual changes. Patient {Actions; denies-reports:120008} self foot exams.     O:  Physical Exam  ROS  Lab Results  Component Value Date   HGBA1C 10.6 (A) 07/20/2021   There were no vitals filed for this visit.  Lipid Panel     Component Value Date/Time   CHOL 201 (H) 07/20/2021 1205   TRIG 372 (H) 07/20/2021 1205   HDL 36 (L) 07/20/2021 1205   CHOLHDL 5.6 (H) 07/20/2021 1205   LDLCALC 102 (H) 07/20/2021 1205    Home fasting blood sugars: ***  2 hour post-meal/random blood sugars: ***.  Clinical Atherosclerotic Cardiovascular Disease (ASCVD): {YES/NO:21197} The  ASCVD Risk score (Arnett DK, et al., 2019) failed to calculate for the following reasons:   The 2019 ASCVD risk score is only valid for ages 67 to 44   PHQ-9 Score: ***  A/P: Diabetes longstanding*** currently ***. Patient is *** able to verbalize appropriate hypoglycemia management plan. Medication adherence appears ***. Control is suboptimal due to ***. -{Meds adjust:18428} basal insulin *** (insulin ***). Patient will continue to titrate 1 unit every *** days if fasting blood sugar > 100mg /dl until fasting blood sugars reach goal or next visit.  -{Meds adjust:18428}  rapid insulin *** (insulin ***) to ***.  -{Meds adjust:18428} GLP-1 *** (generic name***) to ***.  -{Meds adjust:18428} SGLT2-I *** (generic name***) to ***. Counseled on sick day rules for ***. -Extensively discussed pathophysiology of diabetes, recommended lifestyle interventions, dietary effects on blood sugar control -Counseled on s/sx of and management of hypoglycemia -Next A1C anticipated ***.   ASCVD risk - primary***secondary prevention in patient with diabetes. Last LDL {Is/is not:9024} controlled. ASCVD risk score {Is/is not:9024} >20%  - {Desc; low/moderate/high:110033} intensity statin indicated. Aspirin {Is/is not:9024} indicated.  -{Meds adjust:18428} aspirin *** mg  -{Meds adjust:18428} ***statin *** mg.   Hypertension longstanding*** currently ***.  Blood pressure goal = *** mmHg. Medication adherence ***.  Blood pressure control is suboptimal due to ***. -***  Written patient instructions provided.  Total time in face to face counseling *** minutes.   Follow up Pharmacist/PCP*** Clinic Visit in ***.   Patient seen with ***

## 2021-10-17 ENCOUNTER — Ambulatory Visit: Payer: Self-pay | Admitting: Pharmacist

## 2021-10-23 ENCOUNTER — Ambulatory Visit: Payer: Self-pay | Attending: Critical Care Medicine | Admitting: Critical Care Medicine

## 2021-10-23 ENCOUNTER — Other Ambulatory Visit: Payer: Self-pay

## 2021-10-23 ENCOUNTER — Encounter: Payer: Self-pay | Admitting: Critical Care Medicine

## 2021-10-23 VITALS — BP 162/119 | HR 72 | Resp 16 | Wt 247.6 lb

## 2021-10-23 DIAGNOSIS — E785 Hyperlipidemia, unspecified: Secondary | ICD-10-CM

## 2021-10-23 DIAGNOSIS — Z23 Encounter for immunization: Secondary | ICD-10-CM

## 2021-10-23 DIAGNOSIS — E1165 Type 2 diabetes mellitus with hyperglycemia: Secondary | ICD-10-CM

## 2021-10-23 DIAGNOSIS — Z794 Long term (current) use of insulin: Secondary | ICD-10-CM

## 2021-10-23 DIAGNOSIS — B351 Tinea unguium: Secondary | ICD-10-CM

## 2021-10-23 DIAGNOSIS — M21611 Bunion of right foot: Secondary | ICD-10-CM

## 2021-10-23 DIAGNOSIS — M21612 Bunion of left foot: Secondary | ICD-10-CM

## 2021-10-23 DIAGNOSIS — E1169 Type 2 diabetes mellitus with other specified complication: Secondary | ICD-10-CM

## 2021-10-23 DIAGNOSIS — E781 Pure hyperglyceridemia: Secondary | ICD-10-CM

## 2021-10-23 DIAGNOSIS — I10 Essential (primary) hypertension: Secondary | ICD-10-CM

## 2021-10-23 DIAGNOSIS — K029 Dental caries, unspecified: Secondary | ICD-10-CM

## 2021-10-23 LAB — POCT GLYCOSYLATED HEMOGLOBIN (HGB A1C): HbA1c, POC (controlled diabetic range): 12.1 % — AB (ref 0.0–7.0)

## 2021-10-23 LAB — GLUCOSE, POCT (MANUAL RESULT ENTRY): POC Glucose: 340 mg/dl — AB (ref 70–99)

## 2021-10-23 MED ORDER — ATORVASTATIN CALCIUM 40 MG PO TABS
40.0000 mg | ORAL_TABLET | Freq: Every day | ORAL | 2 refills | Status: DC
  Filled 2021-10-23: qty 30, 30d supply, fill #0

## 2021-10-23 MED ORDER — INSULIN LISPRO (1 UNIT DIAL) 100 UNIT/ML (KWIKPEN)
5.0000 [IU] | PEN_INJECTOR | Freq: Two times a day (BID) | SUBCUTANEOUS | 4 refills | Status: DC
  Filled 2021-10-23: qty 3, 28d supply, fill #0

## 2021-10-23 MED ORDER — CARVEDILOL 12.5 MG PO TABS
12.5000 mg | ORAL_TABLET | Freq: Two times a day (BID) | ORAL | 3 refills | Status: DC
  Filled 2021-10-23: qty 60, 30d supply, fill #0

## 2021-10-23 MED ORDER — VALSARTAN-HYDROCHLOROTHIAZIDE 320-25 MG PO TABS
1.0000 | ORAL_TABLET | Freq: Every day | ORAL | 1 refills | Status: DC
Start: 2021-10-23 — End: 2022-03-28
  Filled 2021-10-23: qty 30, 30d supply, fill #0

## 2021-10-23 MED ORDER — AMLODIPINE BESYLATE 10 MG PO TABS
10.0000 mg | ORAL_TABLET | Freq: Every day | ORAL | 2 refills | Status: DC
Start: 2021-10-23 — End: 2022-03-28
  Filled 2021-10-23: qty 30, 30d supply, fill #0

## 2021-10-23 MED ORDER — BASAGLAR KWIKPEN 100 UNIT/ML ~~LOC~~ SOPN
30.0000 [IU] | PEN_INJECTOR | Freq: Every day | SUBCUTANEOUS | 3 refills | Status: DC
  Filled 2021-10-23: qty 9, 30d supply, fill #0

## 2021-10-23 MED ORDER — METFORMIN HCL 500 MG PO TABS
1000.0000 mg | ORAL_TABLET | Freq: Two times a day (BID) | ORAL | 2 refills | Status: DC
  Filled 2021-10-23: qty 120, 30d supply, fill #0

## 2021-10-23 NOTE — Addendum Note (Signed)
Addended by: Dalene Carrow I on: 10/23/2021 09:42 AM   Modules accepted: Orders

## 2021-10-23 NOTE — Assessment & Plan Note (Signed)
Dental caries have been addressed by dentistry

## 2021-10-23 NOTE — Progress Notes (Signed)
Patient states he needs a refill on insulin medication and also states that when taking his bp medication he gets dizzy.

## 2021-10-23 NOTE — Assessment & Plan Note (Signed)
Continue atorvastatin as prescribed 

## 2021-10-23 NOTE — Assessment & Plan Note (Signed)
Poorly controlled at this time  Plan to discontinue metoprolol and begin Coreg 12.5 mg twice daily, continue valsartan HCT and amlodipine as prescribed  Healthy diet reviewed with patient

## 2021-10-23 NOTE — Assessment & Plan Note (Signed)
Poorly controlled diabetes A1c greater than 12  Patient reeducated as to proper diet and insulin use   Begin short acting insulin Humalog 5 units before lunch and dinner hold if less than 150 on blood sugar  Increase insulin glargine to 30 units at bedtime  Patient's not a candidate for SGLT2's until we get his blood sugars under better control with insulin due to risk of DKA  Patient failed GLP's due to side effects  Continue metformin 1000 mg twice daily  Patient to see clinical pharmacy in 3 weeks

## 2021-10-23 NOTE — Assessment & Plan Note (Addendum)
Per podiatry 

## 2021-10-23 NOTE — Assessment & Plan Note (Signed)
Dietary changes 

## 2021-10-23 NOTE — Patient Instructions (Addendum)
Please get an eye exam I recommend the [facility] [address] we gave you an eye  resource sheet  See [name], our clinical pharmacist in 3 weeks Dr. Delford Field in 2 months  Begin insulin lispro kwikpen 5 units twice daily before lunch and dinner, check blood sugar before dosing if less than 150 do not give the insulin at that time  Increase insulin glargine to 30 units daily  Discontinue metoprolol  Begin Coreg carvedilol 12.5 mg twice daily this was sent to our pharmacy  All other medications unchanged refill sent to the pharmacy  Pneumonia and flu vaccine was given  Please follow-up with your podiatrist

## 2021-10-23 NOTE — Assessment & Plan Note (Signed)
Follow-up per podiatry 

## 2021-10-23 NOTE — Progress Notes (Signed)
Established Patient Office Visit  Subjective:  Patient ID: Anthony Hubbard, male    DOB: 10/28/1986  Age: 35 y.o. MRN: [id number]  CC:  Chief Complaint  Patient presents with   Diabetes   Hypertension    HPI [name] presents for primary care follow-up. This patient's not been seen by me since January of this year.  He was seen by the PA in August.  He had his long-acting insulin refilled at that time.  Patient's blood pressure remains elevated and his A1c is risen now to 12.  He has been out of his insulin for a week.  He is inconsistent with his diet.  He has been changing jobs and doing a lot of self-employment work recently.  Patient is due a flu vaccine and pneumonia booster shot and he agreed to receive these.  Patient has been followed by podiatry for his toenail fungus and is also seeing dental care.  He did have an eye exam earlier this year but did not have the back of his eyes checked.  He was reminded about this.  Patient does have insulin testing supplies.  He states he runs blood sugars in the 300s at home.  Blood pressure meter at home also is registering 160/110.  On arrival A1c is 12 and blood pressure 162/119  The patient has no other complaints Past Medical History:  Diagnosis Date   Diabetes mellitus without complication ([place])    type 2    Hypertension    Hypertensive urgency 06/07/2019   Phimosis    Superficial thrombosis of left lower extremity 06/24/2019    Past Surgical History:  Procedure Laterality Date   CIRCUMCISION     I & D EXTREMITY Left 06/10/2019   Procedure: IRRIGATION AND DEBRIDEMENT LEFT LOWER EXTREMITY;  Surgeon: Leandrew Koyanagi, MD;  Location: [facility];  Service: Orthopedics;  Laterality: Left;   TONSILLECTOMY      Family History  Problem Relation Age of Onset   Diabetes Father    Diabetes Paternal Uncle     Social History   Socioeconomic History   Marital status: Married    Spouse name: Not on file   Number of  children: Not on file   Years of education: Not on file   Highest education level: Not on file  Occupational History   Not on file  Tobacco Use   Smoking status: Never   Smokeless tobacco: Never  Vaping Use   Vaping Use: Never used  Substance and Sexual Activity   Alcohol use: No   Drug use: Not Currently    Types: Marijuana   Sexual activity: Not on file  Other Topics Concern   Not on file  Social History Narrative   Not on file   Social Determinants of Health   Financial Resource Strain: Not on file  Food Insecurity: Not on file  Transportation Needs: Not on file  Physical Activity: Not on file  Stress: Not on file  Social Connections: Not on file  Intimate Partner Violence: Not on file    Outpatient Medications Prior to Visit  Medication Sig Dispense Refill   blood glucose meter kit and supplies KIT Dispense based on patient and insurance preference. Use up to four times daily as directed. (FOR ICD-9 250.00, 250.01). 1 each 0   Insulin Pen Needle (TRUEPLUS PEN NEEDLES) 31G X 5 MM MISC use as directed with insulin 100 each 3   Lancets MISC 1 each by Does not apply route 2 (two)  times daily. 100 each 1   amLODipine (NORVASC) 10 MG tablet Take 1 tablet (10 mg total) by mouth daily. 90 tablet 2   atorvastatin (LIPITOR) 40 MG tablet Take 1 tablet (40 mg total) by mouth daily. 90 tablet 2   BD INSULIN SYRINGE U/F 31G X 5/16" 0.3 ML MISC      Continuous Blood Gluc Receiver (FREESTYLE LIBRE 2 READER) DEVI Use to monitor blood sugar 1 each 6   Continuous Blood Gluc Sensor (FREESTYLE LIBRE 2 SENSOR) MISC Use to monitor blood sugar 6 each 3   fluticasone (FLONASE) 50 MCG/ACT nasal spray Place 2 sprays into both nostrils daily. 11.1 mL 0   ibuprofen (ADVIL) 600 MG tablet Take 1 tablet (600 mg total) by mouth every 8 (eight) hours as needed. 60 tablet 1   Insulin Glargine (BASAGLAR KWIKPEN) 100 UNIT/ML Inject 25 Units into the skin daily. 15 mL 3   metFORMIN (GLUCOPHAGE) 500 MG  tablet Take 2 tablets (1,000 mg total) by mouth 2 (two) times daily. 120 tablet 2   metoprolol succinate (TOPROL-XL) 50 MG 24 hr tablet Take 1 tablet (50 mg total) by mouth daily. Take with or immediately following a meal. 90 tablet 3   valsartan-hydrochlorothiazide (DIOVAN-HCT) 320-25 MG tablet Take 1 tablet by mouth daily. 90 tablet 1   No facility-administered medications prior to visit.    No Known Allergies  ROS Review of Systems  Constitutional: Negative.   HENT: Negative.  Negative for ear pain, postnasal drip, rhinorrhea, sinus pressure, sore throat, trouble swallowing and voice change.   Eyes: Negative.   Respiratory: Negative.  Negative for apnea, cough, choking, chest tightness, shortness of breath, wheezing and stridor.   Cardiovascular: Negative.  Negative for chest pain, palpitations and leg swelling.  Gastrointestinal: Negative.  Negative for abdominal distention, abdominal pain, nausea and vomiting.  Genitourinary: Negative.   Musculoskeletal: Negative.  Negative for arthralgias and myalgias.  Skin: Negative.  Negative for rash.  Allergic/Immunologic: Negative.  Negative for environmental allergies and food allergies.  Neurological: Negative.  Negative for dizziness, syncope, weakness and headaches.  Hematological: Negative.  Negative for adenopathy. Does not bruise/bleed easily.  Psychiatric/Behavioral: Negative.  Negative for agitation and sleep disturbance. The patient is not nervous/anxious.      Objective:    Physical Exam Vitals reviewed.  Constitutional:      Appearance: Normal appearance. He is well-developed. He is obese. He is not diaphoretic.  HENT:     Head: Normocephalic and atraumatic.     Nose: No nasal deformity, septal deviation, mucosal edema or rhinorrhea.     Right Sinus: No maxillary sinus tenderness or frontal sinus tenderness.     Left Sinus: No maxillary sinus tenderness or frontal sinus tenderness.     Mouth/Throat:     Pharynx: No  oropharyngeal exudate.  Eyes:     General: No scleral icterus.    Conjunctiva/sclera: Conjunctivae normal.     Pupils: Pupils are equal, round, and reactive to light.  Neck:     Thyroid: No thyromegaly.     Vascular: No carotid bruit or JVD.     Trachea: Trachea normal. No tracheal tenderness or tracheal deviation.  Cardiovascular:     Rate and Rhythm: Normal rate and regular rhythm.     Chest Wall: PMI is not displaced.     Pulses: Normal pulses. No decreased pulses.     Heart sounds: Normal heart sounds, S1 normal and S2 normal. Heart sounds not distant. No murmur heard. No systolic  murmur is present.  No diastolic murmur is present.    No friction rub. No gallop. No S3 or S4 sounds.  Pulmonary:     Effort: No tachypnea, accessory muscle usage or respiratory distress.     Breath sounds: No stridor. No decreased breath sounds, wheezing, rhonchi or rales.  Chest:     Chest wall: No tenderness.  Abdominal:     General: Bowel sounds are normal. There is no distension.     Palpations: Abdomen is soft. Abdomen is not rigid.     Tenderness: There is no abdominal tenderness. There is no guarding or rebound.  Musculoskeletal:        General: Normal range of motion.     Cervical back: Normal range of motion and neck supple. No edema, erythema or rigidity. No muscular tenderness. Normal range of motion.     Comments: Foot exam is normal except for toenail fungus  Lymphadenopathy:     Head:     Right side of head: No submental or submandibular adenopathy.     Left side of head: No submental or submandibular adenopathy.     Cervical: No cervical adenopathy.  Skin:    General: Skin is warm and dry.     Coloration: Skin is not pale.     Findings: No rash.     Nails: There is no clubbing.  Neurological:     Mental Status: He is alert and oriented to person, place, and time.     Sensory: No sensory deficit.  Psychiatric:        Speech: Speech normal.        Behavior: Behavior normal.     BP (!) 162/119   Pulse 72   Resp 16   Wt 247 lb 9.6 oz (112.3 kg)   SpO2 97%   BMI 35.53 kg/m  Wt Readings from Last 3 Encounters:  10/23/21 247 lb 9.6 oz (112.3 kg)  07/20/21 240 lb (108.9 kg)  12/26/20 234 lb (106.1 kg)     Health Maintenance Due  Topic Date Due   Pneumococcal Vaccine 62-40 Years old (2 - PCV) 06/12/2018   COVID-19 Vaccine (3 - Pfizer risk series) 06/06/2020   OPHTHALMOLOGY EXAM  05/25/2021   INFLUENZA VACCINE  06/26/2021    There are no preventive care reminders to display for this patient.  No results found for: TSH Lab Results  Component Value Date   WBC 8.5 07/20/2021   HGB 16.9 07/20/2021   HCT 49.7 07/20/2021   MCV 86 07/20/2021   PLT 181 07/20/2021   Lab Results  Component Value Date   NA 138 07/20/2021   K 5.2 07/20/2021   CO2 23 07/20/2021   GLUCOSE 299 (H) 07/20/2021   BUN 14 07/20/2021   CREATININE 1.10 07/20/2021   BILITOT 0.4 07/20/2021   ALKPHOS 68 07/20/2021   AST 14 07/20/2021   ALT 22 07/20/2021   PROT 7.0 07/20/2021   ALBUMIN 4.5 07/20/2021   CALCIUM 10.0 07/20/2021   ANIONGAP 11 12/02/2020   EGFR 91 07/20/2021   Lab Results  Component Value Date   CHOL 201 (H) 07/20/2021   Lab Results  Component Value Date   HDL 36 (L) 07/20/2021   Lab Results  Component Value Date   LDLCALC 102 (H) 07/20/2021   Lab Results  Component Value Date   TRIG 372 (H) 07/20/2021   Lab Results  Component Value Date   CHOLHDL 5.6 (H) 07/20/2021   Lab Results  Component Value Date  HGBA1C 12.1 (A) 10/23/2021      Assessment & Plan:   Problem List Items Addressed This Visit       Cardiovascular and Mediastinum   Essential hypertension    Poorly controlled at this time  Plan to discontinue metoprolol and begin Coreg 12.5 mg twice daily, continue valsartan HCT and amlodipine as prescribed  Healthy diet reviewed with patient      Relevant Medications   amLODipine (NORVASC) 10 MG tablet   atorvastatin (LIPITOR)  40 MG tablet   valsartan-hydrochlorothiazide (DIOVAN-HCT) 320-25 MG tablet   carvedilol (COREG) 12.5 MG tablet     Digestive   Dental caries    Dental caries have been addressed by dentistry        Endocrine   Type 2 diabetes mellitus with hyperglycemia, with long-term current use of insulin (HCC) - Primary    Poorly controlled diabetes A1c greater than 12  Patient reeducated as to proper diet and insulin use   Begin short acting insulin Humalog 5 units before lunch and dinner hold if less than 150 on blood sugar  Increase insulin glargine to 30 units at bedtime  Patient's not a candidate for SGLT2's until we get his blood sugars under better control with insulin due to risk of DKA  Patient failed GLP's due to side effects  Continue metformin 1000 mg twice daily  Patient to see clinical pharmacy in 3 weeks        Relevant Medications   atorvastatin (LIPITOR) 40 MG tablet   Insulin Glargine (BASAGLAR KWIKPEN) 100 UNIT/ML   metFORMIN (GLUCOPHAGE) 500 MG tablet   valsartan-hydrochlorothiazide (DIOVAN-HCT) 320-25 MG tablet   insulin lispro (HUMALOG) 100 UNIT/ML KwikPen   Other Relevant Orders   HgB A1c (Completed)   Hyperlipidemia associated with type 2 diabetes mellitus (HCC)    Continue atorvastatin as prescribed      Relevant Medications   amLODipine (NORVASC) 10 MG tablet   atorvastatin (LIPITOR) 40 MG tablet   Insulin Glargine (BASAGLAR KWIKPEN) 100 UNIT/ML   metFORMIN (GLUCOPHAGE) 500 MG tablet   valsartan-hydrochlorothiazide (DIOVAN-HCT) 320-25 MG tablet   carvedilol (COREG) 12.5 MG tablet   insulin lispro (HUMALOG) 100 UNIT/ML KwikPen     Musculoskeletal and Integument   Toenail fungus    Follow-up per podiatry      Bilateral bunions    Per podiatry        Other   Hypertriglyceridemia    Dietary changes      Relevant Medications   amLODipine (NORVASC) 10 MG tablet   atorvastatin (LIPITOR) 40 MG tablet   valsartan-hydrochlorothiazide  (DIOVAN-HCT) 320-25 MG tablet   carvedilol (COREG) 12.5 MG tablet    Meds ordered this encounter  Medications   amLODipine (NORVASC) 10 MG tablet    Sig: Take 1 tablet (10 mg total) by mouth daily.    Dispense:  90 tablet    Refill:  2   atorvastatin (LIPITOR) 40 MG tablet    Sig: Take 1 tablet (40 mg total) by mouth daily.    Dispense:  90 tablet    Refill:  2   Insulin Glargine (BASAGLAR KWIKPEN) 100 UNIT/ML    Sig: Inject 30 Units into the skin daily.    Dispense:  15 mL    Refill:  3   metFORMIN (GLUCOPHAGE) 500 MG tablet    Sig: Take 2 tablets (1,000 mg total) by mouth 2 (two) times daily.    Dispense:  120 tablet    Refill:  2  valsartan-hydrochlorothiazide (DIOVAN-HCT) 320-25 MG tablet    Sig: Take 1 tablet by mouth daily.    Dispense:  90 tablet    Refill:  1   carvedilol (COREG) 12.5 MG tablet    Sig: Take 1 tablet (12.5 mg total) by mouth 2 (two) times daily with a meal.    Dispense:  120 tablet    Refill:  3   insulin lispro (HUMALOG) 100 UNIT/ML KwikPen    Sig: Inject 5 Units into the skin 2 (two) times daily before a meal. Hold if blood sugar is less than 150    Dispense:  6 mL    Refill:  4    38 minutes spent performing history and physical educating patient complex medical making high Follow-up: Return in about 2 months (around 12/23/2021).    Asencion Noble, MD

## 2021-10-24 ENCOUNTER — Other Ambulatory Visit: Payer: Self-pay

## 2021-10-30 ENCOUNTER — Other Ambulatory Visit: Payer: Self-pay

## 2021-10-31 ENCOUNTER — Other Ambulatory Visit: Payer: Self-pay

## 2021-11-13 ENCOUNTER — Ambulatory Visit: Payer: Self-pay | Attending: Critical Care Medicine | Admitting: Pharmacist

## 2021-11-13 ENCOUNTER — Other Ambulatory Visit: Payer: Self-pay

## 2021-11-13 DIAGNOSIS — Z794 Long term (current) use of insulin: Secondary | ICD-10-CM

## 2021-11-13 DIAGNOSIS — E1165 Type 2 diabetes mellitus with hyperglycemia: Secondary | ICD-10-CM

## 2021-11-13 MED ORDER — INSULIN LISPRO (1 UNIT DIAL) 100 UNIT/ML (KWIKPEN)
10.0000 [IU] | PEN_INJECTOR | Freq: Two times a day (BID) | SUBCUTANEOUS | 4 refills | Status: DC
  Filled 2021-11-13 – 2022-03-20 (×2): qty 6, 30d supply, fill #0

## 2021-11-13 MED ORDER — BASAGLAR KWIKPEN 100 UNIT/ML ~~LOC~~ SOPN
34.0000 [IU] | PEN_INJECTOR | Freq: Every day | SUBCUTANEOUS | 3 refills | Status: DC
  Filled 2021-11-13 – 2022-03-20 (×2): qty 9, 26d supply, fill #0

## 2021-11-13 NOTE — Progress Notes (Signed)
° ° °  S:    PCP: Dr. Delford Field   No chief complaint on file.  Patient arrives good spirits.  Presents for diabetes evaluation, education, and management Patient was referred and last seen by Primary Care Provider on 10/23/2021. Pt's A1c has been trending up since August of this year. A1c at that visit was 12.1%.   Patient admits that his recent hyperglycemia can be attributed to running out of medications and stress. He tells me he has a lot going on but seems motivated to make some changes. At his previous PCP visit, he was changed to Basaglar + Humalog and established with our pharmacy.   Family/Social History: DM (father); never smoker, denies alcohol intake.  Insurance coverage/medication affordability: self-pay   Patient reports adherence with medications.  Current diabetes medications include: Humalog 5 units BID (taking 8 units BID), Basaglar 30 units daily, metformin 1000 mg BID  Patient denies hypoglycemic events.  Patient reported dietary habits: Only eats 2 meals daily. Has a hx of dietary noncompliance with frequent fast food. He tells me he is now eating better.   Patient-reported exercise habits: walks around apartment complex for ~1hr every day.   Patient denies nocturia.  Patient denies neuropathy. Patient denies visual changes. Patient reports self foot exams.    O:  Lab Results  Component Value Date   HGBA1C 12.1 (A) 10/23/2021   Patient reported home CBGs:  Home fasting CBG: 180s-200s Home post-prandial/random CBGs: 180s-200s He tells me he has been out of the 300-400 range since starting Humalog and Basaglar.   Clinical ASCVD: No  The ASCVD Risk score Denman George DC Jr., et al., 2013) failed to calculate for the following reasons:   The 2013 ASCVD risk score is only valid for ages 67 to 75   A/P: Diabetes longstanding currently uncontrolled based on A1c but home CBGs are improving. Patient is able to verbalize appropriate hypoglycemia management plan. Confirms  adherence to medications.  -Increase Basaglar to 34 units daily.  -Increase Novolog 10 units, 15 minutes before meals BID.   -Continue metformin 1000 mg BID.  -Extensively discussed pathophysiology of DM, recommended lifestyle interventions, dietary effects on glycemic control -Counseled on s/sx of and management of hypoglycemia -12/2020  Written patient instructions provided.  Total time in face to face counseling 20 minutes.   Follow up in 1 month.   Butch Penny, PharmD, [place]y [place], CPP Clinical Pharmacist [facility] [phone]

## 2021-11-22 ENCOUNTER — Other Ambulatory Visit: Payer: Self-pay

## 2021-12-08 ENCOUNTER — Other Ambulatory Visit: Payer: Self-pay

## 2021-12-14 ENCOUNTER — Ambulatory Visit: Payer: Self-pay | Attending: Critical Care Medicine | Admitting: Pharmacist

## 2021-12-23 NOTE — Progress Notes (Incomplete)
Established Patient Office Visit  Subjective:  Patient ID: Anthony Hubbard, male    DOB: 1986-04-30  Age: 36 y.o. MRN: [id number]  CC: No chief complaint on file.   HPI Anthony Hubbard presents for   Past Medical History:  Diagnosis Date   Diabetes mellitus without complication ([place])    type 2    Hypertension    Hypertensive urgency 06/07/2019   Phimosis    Superficial thrombosis of left lower extremity 06/24/2019    Past Surgical History:  Procedure Laterality Date   CIRCUMCISION     I & D EXTREMITY Left 06/10/2019   Procedure: IRRIGATION AND DEBRIDEMENT LEFT LOWER EXTREMITY;  Surgeon: Leandrew Koyanagi, MD;  Location: [facility];  Service: Orthopedics;  Laterality: Left;   TONSILLECTOMY      Family History  Problem Relation Age of Onset   Diabetes Father    Diabetes Paternal Uncle     Social History   Socioeconomic History   Marital status: Married    Spouse name: Not on file   Number of children: Not on file   Years of education: Not on file   Highest education level: Not on file  Occupational History   Not on file  Tobacco Use   Smoking status: Never   Smokeless tobacco: Never  Vaping Use   Vaping Use: Never used  Substance and Sexual Activity   Alcohol use: No   Drug use: Not Currently    Types: Marijuana   Sexual activity: Not on file  Other Topics Concern   Not on file  Social History Narrative   Not on file   Social Determinants of Health   Financial Resource Strain: Not on file  Food Insecurity: Not on file  Transportation Needs: Not on file  Physical Activity: Not on file  Stress: Not on file  Social Connections: Not on file  Intimate Partner Violence: Not on file    Outpatient Medications Prior to Visit  Medication Sig Dispense Refill   amLODipine (NORVASC) 10 MG tablet Take 1 tablet (10 mg total) by mouth daily. 90 tablet 2   atorvastatin (LIPITOR) 40 MG tablet Take 1 tablet (40 mg total) by mouth daily. 90  tablet 2   blood glucose meter kit and supplies KIT Dispense based on patient and insurance preference. Use up to four times daily as directed. (FOR ICD-9 250.00, 250.01). 1 each 0   carvedilol (COREG) 12.5 MG tablet Take 1 tablet (12.5 mg total) by mouth 2 (two) times daily with a meal. 120 tablet 3   Insulin Glargine (BASAGLAR KWIKPEN) 100 UNIT/ML Inject 34 Units into the skin daily. 15 mL 3   insulin lispro (HUMALOG) 100 UNIT/ML KwikPen Inject 10 Units into the skin 2 (two) times daily before a meal. Hold if blood sugar is less than 150 6 mL 4   Insulin Pen Needle (TRUEPLUS PEN NEEDLES) 31G X 5 MM MISC use as directed with insulin 100 each 3   Lancets MISC 1 each by Does not apply route 2 (two) times daily. 100 each 1   metFORMIN (GLUCOPHAGE) 500 MG tablet Take 2 tablets (1,000 mg total) by mouth 2 (two) times daily. 120 tablet 2   valsartan-hydrochlorothiazide (DIOVAN-HCT) 320-25 MG tablet Take 1 tablet by mouth daily. 90 tablet 1   No facility-administered medications prior to visit.    No Known Allergies  ROS Review of Systems    Objective:    Physical Exam  There were no vitals taken for this visit.  Wt Readings from Last 3 Encounters:  10/23/21 247 lb 9.6 oz (112.3 kg)  07/20/21 240 lb (108.9 kg)  12/26/20 234 lb (106.1 kg)     Health Maintenance Due  Topic Date Due   COVID-19 Vaccine (3 - Pfizer risk series) 06/06/2020   OPHTHALMOLOGY EXAM  05/25/2021    There are no preventive care reminders to display for this patient.  No results found for: TSH Lab Results  Component Value Date   WBC 8.5 07/20/2021   HGB 16.9 07/20/2021   HCT 49.7 07/20/2021   MCV 86 07/20/2021   PLT 181 07/20/2021   Lab Results  Component Value Date   NA 138 07/20/2021   K 5.2 07/20/2021   CO2 23 07/20/2021   GLUCOSE 299 (H) 07/20/2021   BUN 14 07/20/2021   CREATININE 1.10 07/20/2021   BILITOT 0.4 07/20/2021   ALKPHOS 68 07/20/2021   AST 14 07/20/2021   ALT 22  07/20/2021   PROT 7.0 07/20/2021   ALBUMIN 4.5 07/20/2021   CALCIUM 10.0 07/20/2021   ANIONGAP 11 12/02/2020   EGFR 91 07/20/2021   Lab Results  Component Value Date   CHOL 201 (H) 07/20/2021   Lab Results  Component Value Date   HDL 36 (L) 07/20/2021   Lab Results  Component Value Date   LDLCALC 102 (H) 07/20/2021   Lab Results  Component Value Date   TRIG 372 (H) 07/20/2021   Lab Results  Component Value Date   CHOLHDL 5.6 (H) 07/20/2021   Lab Results  Component Value Date   HGBA1C 12.1 (A) 10/23/2021      Assessment & Plan:   Problem List Items Addressed This Visit   None   No orders of the defined types were placed in this encounter.   Follow-up: No follow-ups on file.    Asencion Noble, MD

## 2021-12-25 ENCOUNTER — Ambulatory Visit: Payer: Self-pay | Admitting: Critical Care Medicine

## 2022-03-20 ENCOUNTER — Other Ambulatory Visit: Payer: Self-pay

## 2022-03-28 ENCOUNTER — Other Ambulatory Visit: Payer: Self-pay

## 2022-03-28 ENCOUNTER — Encounter: Payer: Self-pay | Admitting: Critical Care Medicine

## 2022-03-28 ENCOUNTER — Ambulatory Visit: Payer: Self-pay | Attending: Critical Care Medicine | Admitting: Physician Assistant

## 2022-03-28 ENCOUNTER — Encounter: Payer: Self-pay | Admitting: Physician Assistant

## 2022-03-28 VITALS — BP 155/104 | HR 80 | Resp 16 | Ht 69.0 in | Wt 233.0 lb

## 2022-03-28 DIAGNOSIS — I10 Essential (primary) hypertension: Secondary | ICD-10-CM

## 2022-03-28 DIAGNOSIS — E1169 Type 2 diabetes mellitus with other specified complication: Secondary | ICD-10-CM

## 2022-03-28 DIAGNOSIS — E1165 Type 2 diabetes mellitus with hyperglycemia: Secondary | ICD-10-CM

## 2022-03-28 DIAGNOSIS — E785 Hyperlipidemia, unspecified: Secondary | ICD-10-CM

## 2022-03-28 DIAGNOSIS — Z794 Long term (current) use of insulin: Secondary | ICD-10-CM

## 2022-03-28 LAB — POCT GLYCOSYLATED HEMOGLOBIN (HGB A1C): Hemoglobin A1C: 9.9 % — AB (ref 4.0–5.6)

## 2022-03-28 LAB — GLUCOSE, POCT (MANUAL RESULT ENTRY): POC Glucose: 280 mg/dl — AB (ref 70–99)

## 2022-03-28 MED ORDER — TRUEPLUS PEN NEEDLES 31G X 5 MM MISC
3 refills | Status: DC
  Filled 2022-03-28: qty 100, fill #0

## 2022-03-28 MED ORDER — AMLODIPINE BESYLATE 10 MG PO TABS
10.0000 mg | ORAL_TABLET | Freq: Every day | ORAL | 2 refills | Status: DC
  Filled 2022-03-28: qty 90, 90d supply, fill #0

## 2022-03-28 MED ORDER — FREESTYLE LIBRE 2 SENSOR MISC
1.0000 | Freq: Three times a day (TID) | 11 refills | Status: DC
  Filled 2022-03-28: qty 1, 14d supply, fill #0

## 2022-03-28 MED ORDER — CARVEDILOL 12.5 MG PO TABS
12.5000 mg | ORAL_TABLET | Freq: Two times a day (BID) | ORAL | 3 refills | Status: DC
  Filled 2022-03-28: qty 120, 60d supply, fill #0

## 2022-03-28 MED ORDER — METFORMIN HCL 500 MG PO TABS
1000.0000 mg | ORAL_TABLET | Freq: Two times a day (BID) | ORAL | 2 refills | Status: DC
  Filled 2022-03-28: qty 120, 30d supply, fill #0

## 2022-03-28 MED ORDER — BASAGLAR KWIKPEN 100 UNIT/ML ~~LOC~~ SOPN
44.0000 [IU] | PEN_INJECTOR | Freq: Every day | SUBCUTANEOUS | 3 refills | Status: DC
  Filled 2022-03-28: qty 15, 34d supply, fill #0

## 2022-03-28 MED ORDER — VALSARTAN-HYDROCHLOROTHIAZIDE 320-25 MG PO TABS
1.0000 | ORAL_TABLET | Freq: Every day | ORAL | 1 refills | Status: DC
  Filled 2022-03-28: qty 90, 90d supply, fill #0

## 2022-03-28 MED ORDER — INSULIN LISPRO (1 UNIT DIAL) 100 UNIT/ML (KWIKPEN)
12.0000 [IU] | PEN_INJECTOR | Freq: Two times a day (BID) | SUBCUTANEOUS | 4 refills | Status: DC
  Filled 2022-03-28: qty 6, 25d supply, fill #0

## 2022-03-28 MED ORDER — ATORVASTATIN CALCIUM 40 MG PO TABS
40.0000 mg | ORAL_TABLET | Freq: Every day | ORAL | 2 refills | Status: DC
  Filled 2022-03-28: qty 90, 90d supply, fill #0

## 2022-03-28 NOTE — Progress Notes (Signed)
? ?Anthony Hubbard, is a 36 y.o. male ? ?[id number] ? ?[id number] ? ?DOB - 18-Jul-1986 ? ?Chief Complaint  ?Patient presents with  ? Diabetes  ?    ? ?Subjective:  ? ?Anthony Hubbard is a 36 y.o. male here today for a fmed RF.  He has only been taking BP meds at half doses and I am not clear if he has been taking carvedilol at all.   ? ?He has been taking 40 units basaglar daily and 15 units humalog bid and sometimes 10 units bid.  He was out of insulin for a few days but overall reports compliance with diabetes meds.  He would like to get a freestyle libre sensors bc he has the device and wants to start using that again now that he is insured again.  When he has checked his blood sugars it has been 71-399.  Does not adhere to diabetic diet ? ?No problems updated. ? ?ALLERGIES: ?No Known Allergies ? ?PAST MEDICAL HISTORY: ?Past Medical History:  ?Diagnosis Date  ? Diabetes mellitus without complication ([place])   ? type 2   ? Hypertension   ? Hypertensive urgency 06/07/2019  ? Phimosis   ? Superficial thrombosis of left lower extremity 06/24/2019  ? ? ?MEDICATIONS AT HOME: ?Prior to Admission medications   ?Medication Sig Start Date End Date Taking? Authorizing Provider  ?blood glucose meter kit and supplies KIT Dispense based on patient and insurance preference. Use up to four times daily as directed. (FOR ICD-9 250.00, 250.01). 06/15/19  Yes Kathie Dike, MD  ?Continuous Blood Gluc Sensor (FREESTYLE LIBRE 2 SENSOR) MISC 1 each by Does not apply route 3 (three) times daily. 03/28/22  Yes Argentina Donovan, PA-C  ?Lancets MISC 1 each by Does not apply route 2 (two) times daily. 07/20/21  Yes Argentina Donovan, PA-C  ?amLODipine (NORVASC) 10 MG tablet Take 1 tablet (10 mg total) by mouth daily. 03/28/22   Argentina Donovan, PA-C  ?atorvastatin (LIPITOR) 40 MG tablet Take 1 tablet (40 mg total) by mouth daily. 03/28/22   Argentina Donovan, PA-C  ?carvedilol (COREG) 12.5 MG tablet Take 1 tablet (12.5 mg total) by  mouth 2 (two) times daily with a meal. 03/28/22   Argentina Donovan, PA-C  ?Insulin Glargine (BASAGLAR KWIKPEN) 100 UNIT/ML Inject 44 Units into the skin daily. 03/28/22   Argentina Donovan, PA-C  ?insulin lispro (HUMALOG) 100 UNIT/ML KwikPen Inject 12 Units into the skin 2 (two) times daily before a meal. Hold if blood sugar is less than 150 03/28/22   Argentina Donovan, PA-C  ?Insulin Pen Needle (TRUEPLUS PEN NEEDLES) 31G X 5 MM MISC use as directed with insulin 03/28/22   Argentina Donovan, PA-C  ?metFORMIN (GLUCOPHAGE) 500 MG tablet Take 2 tablets (1,000 mg total) by mouth 2 (two) times daily. 03/28/22   Argentina Donovan, PA-C  ?valsartan-hydrochlorothiazide (DIOVAN-HCT) 320-25 MG tablet Take 1 tablet by mouth daily. 03/28/22   Argentina Donovan, PA-C  ? ? ?ROS: ?Neg HEENT ?Neg resp ?Neg cardiac ?Neg GI ?Neg GU ?Neg MS ?Neg psych ?Neg neuro ? ?Objective:  ? ?Vitals:  ? 03/28/22 1030  ?BP: (!) 155/104  ?Pulse: 80  ?Resp: 16  ?SpO2: 97%  ?Weight: 233 lb (105.7 kg)  ?Height: '5\' 9"'  (1.753 m)  ? ?Exam ?General appearance : Awake, alert, not in any distress. Speech Clear. Not toxic looking ?HEENT: Atraumatic and Normocephalic ?Neck: Supple, no JVD. No cervical lymphadenopathy.  ?Chest: Good air entry bilaterally, CTAB.  No rales/rhonchi/wheezing ?CVS: S1 S2 regular, no murmurs.  ?Extremities: B/L Lower Ext shows no edema, both legs are warm to touch ?Neurology: Awake alert, and oriented X 3, CN II-XII intact, Non focal ?Skin: No Rash ? ?Data Review ?Lab Results  ?Component Value Date  ? HGBA1C 9.9 (A) 03/28/2022  ? HGBA1C 12.1 (A) 10/23/2021  ? HGBA1C 10.6 (A) 07/20/2021  ? ? ?Assessment & Plan  ? ?1. Type 2 diabetes mellitus with hyperglycemia, with long-term current use of insulin ([place]) ?Improved but not at goal.  I will increase basaglar to 44 units(he has been taking 40, and have him take 12 units humalog bid with meals.  Check blood sugars twice daily and record and bring to next visit ?- Glucose (CBG) ?- HgB A1c ?- Insulin  Glargine (BASAGLAR KWIKPEN) 100 UNIT/ML; Inject 44 Units into the skin daily.  Dispense: 15 mL; Refill: 3 ?- insulin lispro (HUMALOG) 100 UNIT/ML KwikPen; Inject 12 Units into the skin 2 (two) times daily before a meal. Hold if blood sugar is less than 150  Dispense: 6 mL; Refill: 4 ?- metFORMIN (GLUCOPHAGE) 500 MG tablet; Take 2 tablets (1,000 mg total) by mouth 2 (two) times daily.  Dispense: 120 tablet; Refill: 2 ?- Insulin Pen Needle (TRUEPLUS PEN NEEDLES) 31G X 5 MM MISC; use as directed with insulin  Dispense: 100 each; Refill: 3 ?- Continuous Blood Gluc Sensor (FREESTYLE LIBRE 2 SENSOR) MISC; 1 each by Does not apply route 3 (three) times daily.  Dispense: 1 each; Refill: 11 ?- Comprehensive metabolic panel ?- CBC with Differential/Platelet ? ?2. Essential hypertension ?Uncontrolled-not taking meds as prescribed-taking 1/2 tablets;  I will send Rx as currently prescribed-unclear if he is taking carvedilol at all.   ?Check blood pressure daily and record and write down meds EXACTLY as you are taking them. ?- amLODipine (NORVASC) 10 MG tablet; Take 1 tablet (10 mg total) by mouth daily.  Dispense: 90 tablet; Refill: 2 ?- carvedilol (COREG) 12.5 MG tablet; Take 1 tablet (12.5 mg total) by mouth 2 (two) times daily with a meal.  Dispense: 120 tablet; Refill: 3 ?- valsartan-hydrochlorothiazide (DIOVAN-HCT) 320-25 MG tablet; Take 1 tablet by mouth daily.  Dispense: 90 tablet; Refill: 1 ?- Comprehensive metabolic panel ?- CBC with Differential/Platelet ? ?3. Hyperlipidemia associated with type 2 diabetes mellitus ([facility]) ?- atorvastatin (LIPITOR) 40 MG tablet; Take 1 tablet (40 mg total) by mouth daily.  Dispense: 90 tablet; Refill: 2 ?- Lipid panel ?- CBC with Differential/Platelet ? ?Return for 4 weeks with Lurena Joiner 3 months with PCP. ? ?The patient was given clear instructions to go to ER or return to medical center if symptoms don't improve, worsen or new problems develop. The patient verbalized understanding. The  patient was told to call to get lab results if they haven't heard anything in the next week.  ? ? ? ? ?Freeman Caldron, PA-C ?[place] ?[place], [place] ?[phone]   ?03/28/2022, 11:09 AM Patient ID: [name], male   DOB: 03/13/1986, 36 y.o.   MRN: [id number] ? ?

## 2022-03-28 NOTE — Patient Instructions (Signed)
Check blood pressure daily and record and write down meds EXACTLY as you are taking them. ? ?Check blood sugars twice daily and record and bring to next visit ?

## 2022-03-29 LAB — COMPREHENSIVE METABOLIC PANEL
ALT: 30 IU/L (ref 0–44)
AST: 18 IU/L (ref 0–40)
Albumin/Globulin Ratio: 1.8 (ref 1.2–2.2)
Albumin: 4.5 g/dL (ref 4.0–5.0)
Alkaline Phosphatase: 71 IU/L (ref 44–121)
BUN/Creatinine Ratio: 16 (ref 9–20)
BUN: 16 mg/dL (ref 6–20)
Bilirubin Total: 0.4 mg/dL (ref 0.0–1.2)
CO2: 24 mmol/L (ref 20–29)
Calcium: 9.7 mg/dL (ref 8.7–10.2)
Chloride: 98 mmol/L (ref 96–106)
Creatinine, Ser: 1.01 mg/dL (ref 0.76–1.27)
Globulin, Total: 2.5 g/dL (ref 1.5–4.5)
Glucose: 280 mg/dL — ABNORMAL HIGH (ref 70–99)
Potassium: 4.1 mmol/L (ref 3.5–5.2)
Sodium: 138 mmol/L (ref 134–144)
Total Protein: 7 g/dL (ref 6.0–8.5)
eGFR: 101 mL/min/{1.73_m2} (ref >59)

## 2022-03-29 LAB — CBC WITH DIFFERENTIAL/PLATELET
Basophils Absolute: 0.1 10*3/uL (ref 0.0–0.2)
Basos: 1 %
EOS (ABSOLUTE): 0.2 10*3/uL (ref 0.0–0.4)
Eos: 2 %
Hematocrit: 49.6 % (ref 37.5–51.0)
Hemoglobin: 16.8 g/dL (ref 13.0–17.7)
Immature Grans (Abs): 0 10*3/uL (ref 0.0–0.1)
Immature Granulocytes: 1 %
Lymphocytes Absolute: 2.5 10*3/uL (ref 0.7–3.1)
Lymphs: 31 %
MCH: 28.9 pg (ref 26.6–33.0)
MCHC: 33.9 g/dL (ref 31.5–35.7)
MCV: 85 fL (ref 79–97)
Monocytes Absolute: 0.5 10*3/uL (ref 0.1–0.9)
Monocytes: 6 %
Neutrophils Absolute: 4.8 10*3/uL (ref 1.4–7.0)
Neutrophils: 59 %
Platelets: 152 10*3/uL (ref 150–450)
RBC: 5.81 x10E6/uL — ABNORMAL HIGH (ref 4.14–5.80)
RDW: 12.6 % (ref 11.6–15.4)
WBC: 8 10*3/uL (ref 3.4–10.8)

## 2022-03-29 LAB — LIPID PANEL
Chol/HDL Ratio: 5.7 ratio — ABNORMAL HIGH (ref 0.0–5.0)
Cholesterol, Total: 182 mg/dL (ref 100–199)
HDL: 32 mg/dL — ABNORMAL LOW (ref >39)
LDL Chol Calc (NIH): 91 mg/dL (ref 0–99)
Triglycerides: 357 mg/dL — ABNORMAL HIGH (ref 0–149)
VLDL Cholesterol Cal: 59 mg/dL — ABNORMAL HIGH (ref 5–40)

## 2022-04-04 ENCOUNTER — Other Ambulatory Visit: Payer: Self-pay

## 2022-04-11 ENCOUNTER — Ambulatory Visit: Payer: Self-pay | Admitting: Physician Assistant

## 2022-04-26 ENCOUNTER — Ambulatory Visit: Payer: Self-pay

## 2022-04-26 ENCOUNTER — Telehealth: Payer: Self-pay | Admitting: Physician Assistant

## 2022-04-26 DIAGNOSIS — R41840 Attention and concentration deficit: Secondary | ICD-10-CM

## 2022-04-26 DIAGNOSIS — N529 Male erectile dysfunction, unspecified: Secondary | ICD-10-CM

## 2022-04-26 NOTE — Telephone Encounter (Signed)
Summary: memory issue and focus issue   Pt had a hard time focusing today and was sent home from work/ pt is not able to read things with it bothering him/ he zones out in meetings and having trouble with his memory and doesn't know why / please advise     Recently worse focus, read, short attention span. Having trouble having relations with GF. Pt did not want to go in depth.  Chief Complaint: poor focus,concentration,attentiveness,fatigue,easily overwhelmed with tasks which leads to anxiety, unable to comprehend what he reads.  Symptoms: fatigue and random dizziness (pt stated has been dizzy more often Frequency: pt stated e has had most of the lack of focus and most of his life Pertinent Negatives: Patient denies weakness or numbness to face, arms or legs, Disposition: [] ED /[] Urgent Care (no appt availability in office) / [] Appointment(In office/virtual)/ [x]  [facility]/ [] Home Care/ [] Refused Recommended Disposition /[] [facility]/ []  Follow-up with PCP Additional Notes: no available appts in office.  Pt was driving during call.   Reason for Disposition  MILD dizziness is a chronic symptom (recurrent or ongoing AND present > 4 weeks)  Answer Assessment - Initial Assessment Questions 1. DESCRIPTION: "Describe your dizziness."    Feels like he is going to faint when standing- pt stated the dizziness has been present for years- happening more often 2. VERTIGO: "Do you feel like either you or the room is spinning or tilting?"      no 3. LIGHTHEADED: "Do you feel lightheaded?" (e.g., somewhat faint, woozy, weak upon standing)     When standing 4. SEVERITY: "How bad is it?"  "Can you walk?"   - MILD: Feels slightly dizzy and unsteady, but is walking normally.   - MODERATE: Feels unsteady when walking, but not falling; interferes with normal activities (e.g., school, work).   - SEVERE: Unable to walk without falling, or requires assistance to walk without falling.      mild 5. ONSET:  "When did the dizziness begin?"     Chronic "years ago just getting more common" 6. AGGRAVATING FACTORS: "Does anything make it worse?" (e.g., standing, change in head position)     standing 7. CAUSE: "What do you think is causing the dizziness?"     unknown 8. RECURRENT SYMPTOM: "Have you had dizziness before?" If Yes, ask: "When was the last time?" "What happened that time?"     Yes  9. OTHER SYMPTOMS: "Do you have any other symptoms?" (e.g., headache, weakness, numbness, vomiting, earache)     Poor focus, lack of energy, unable pay attention to anything, "zone" out sometimes  Protocols used: Dizziness - Vertigo-A-AH

## 2022-04-26 NOTE — Progress Notes (Signed)
Virtual Visit Consent   [name], you are scheduled for a virtual visit with a [facility] provider today. Just as with appointments in the office, your consent must be obtained to participate. Your consent will be active for this visit and any virtual visit you may have with one of our providers in the next 365 days. If you have a MyChart account, a copy of this consent can be sent to you electronically.  As this is a virtual visit, video technology does not allow for your provider to perform a traditional examination. This may limit your provider's ability to fully assess your condition. If your provider identifies any concerns that need to be evaluated in person or the need to arrange testing (such as labs, EKG, etc.), we will make arrangements to do so. Although advances in technology are sophisticated, we cannot ensure that it will always work on either your end or our end. If the connection with a video visit is poor, the visit may have to be switched to a telephone visit. With either a video or telephone visit, we are not always able to ensure that we have a secure connection.  By engaging in this virtual visit, you consent to the provision of healthcare and authorize for your insurance to be billed (if applicable) for the services provided during this visit. Depending on your insurance coverage, you may receive a charge related to this service.  I need to obtain your verbal consent now. Are you willing to proceed with your visit today? Anthony Hubbard has provided verbal consent on 04/26/2022 for a virtual visit (video or telephone). Mar Daring, PA-C  Date: 04/26/2022 5:51 PM  Virtual Visit via Video Note   IMar Daring, connected with  Anthony Hubbard  ([id number], 1986/08/26) on 04/26/22 at  5:30 PM EDT by a video-enabled telemedicine application and verified that I am speaking with the correct person using two identifiers.  Location: Patient: Virtual Visit  Location Patient: Home Provider: Virtual Visit Location Provider: Home Office   I discussed the limitations of evaluation and management by telemedicine and the availability of in person appointments. The patient expressed understanding and agreed to proceed.    History of Present Illness: Anthony Hubbard is a 36 y.o. who identifies as a male who was assigned male at birth, and is being seen today for 2 complaints.  Focus/Attention: Having increasing issues with focus and attention. Reports has been a long standing issue, even when he was younger he could not finish reading paragraphs or even with testing would guess at answers because he could not complete reading sentences. Now as an adult has been having issues with work. Unable to learn new task well. Has been sent home from jobs due to focus issues and has been fired from positions as well. He is Hispanic and reports with his culture it is not something that is thought to be a true issue, so he has always hidden this problem and tried to work through things, unsuccessfully, making him feel like a failure. This increases his anxiety and causes panic-type feelings when having to focus to complete a task.  Erectile Dysfunction: Noted more recently. Does have HTN (last check uncontrolled) and T2DM. His last office visit with his PCP 4 weeks ago he did have his BP medications increased as well as his insulin.  Problems:  Patient Active Problem List   Diagnosis Date Noted   Dental caries 05/03/2020   Toenail fungus 07/15/2019   Bilateral bunions  07/15/2019   Type 2 diabetes mellitus with hyperglycemia, with long-term current use of insulin ([place]) 06/07/2019   Essential hypertension 06/07/2019   Hypertriglyceridemia 11/20/2017   Hyperlipidemia associated with type 2 diabetes mellitus ([facility]) 06/12/2017    Allergies: No Known Allergies Medications:  Current Outpatient Medications:    amLODipine (NORVASC) 10 MG tablet, Take 1 tablet (10 mg  total) by mouth daily., Disp: 90 tablet, Rfl: 2   atorvastatin (LIPITOR) 40 MG tablet, Take 1 tablet (40 mg total) by mouth daily., Disp: 90 tablet, Rfl: 2   blood glucose meter kit and supplies KIT, Dispense based on patient and insurance preference. Use up to four times daily as directed. (FOR ICD-9 250.00, 250.01)., Disp: 1 each, Rfl: 0   carvedilol (COREG) 12.5 MG tablet, Take 1 tablet (12.5 mg total) by mouth 2 (two) times daily with a meal., Disp: 120 tablet, Rfl: 3   Continuous Blood Gluc Sensor (FREESTYLE LIBRE 2 SENSOR) MISC, Use as directed 3 (three) times daily., Disp: 1 each, Rfl: 11   Insulin Glargine (BASAGLAR KWIKPEN) 100 UNIT/ML, Inject 44 Units into the skin daily., Disp: 15 mL, Rfl: 3   insulin lispro (HUMALOG) 100 UNIT/ML KwikPen, Inject 12 Units into the skin 2 (two) times daily before a meal. Hold if blood sugar is less than 150, Disp: 6 mL, Rfl: 4   Insulin Pen Needle (TRUEPLUS PEN NEEDLES) 31G X 5 MM MISC, use as directed with insulin, Disp: 100 each, Rfl: 3   Lancets MISC, 1 each by Does not apply route 2 (two) times daily., Disp: 100 each, Rfl: 1   metFORMIN (GLUCOPHAGE) 500 MG tablet, Take 2 tablets (1,000 mg total) by mouth 2 (two) times daily., Disp: 120 tablet, Rfl: 2   valsartan-hydrochlorothiazide (DIOVAN-HCT) 320-25 MG tablet, Take 1 tablet by mouth daily., Disp: 90 tablet, Rfl: 1  Observations/Objective: Patient is well-developed, well-nourished in no acute distress.  Resting comfortably at home.  Head is normocephalic, atraumatic.  No labored breathing.  Speech is clear and coherent with logical content.  Patient is alert and oriented at baseline.    Assessment and Plan: 1. Attention and concentration deficit  2. Erectile dysfunction, unspecified erectile dysfunction type  - Discussed for him to bring these issues up with PCP on Monday 04/30/22 @ 0830 - Will give information for [facility] for testing and CBT - Discussed that ED could  be multifactorial with uncontrolled T2DM and HTN, but also HTN medications could also contribute. Can discuss with PCP about altering medications if felt necessary  Follow Up Instructions: I discussed the assessment and treatment plan with the patient. The patient was provided an opportunity to ask questions and all were answered. The patient agreed with the plan and demonstrated an understanding of the instructions.  A copy of instructions were sent to the patient via MyChart unless otherwise noted below.    The patient was advised to call back or seek an in-person evaluation if the symptoms worsen or if the condition fails to improve as anticipated.  Time:  I spent 24 minutes with the patient via telehealth technology discussing the above problems/concerns.    Mar Daring, PA-C

## 2022-04-26 NOTE — Patient Instructions (Signed)
Anthony Hubbard, thank you for joining Mar Daring, PA-C for today's virtual visit.  While this provider is not your primary care provider (PCP), if your PCP is located in our provider database this encounter information will be shared with them immediately following your visit.  Consent: (Patient) Anthony Hubbard provided verbal consent for this virtual visit at the beginning of the encounter.  Current Medications:  Current Outpatient Medications:    amLODipine (NORVASC) 10 MG tablet, Take 1 tablet (10 mg total) by mouth daily., Disp: 90 tablet, Rfl: 2   atorvastatin (LIPITOR) 40 MG tablet, Take 1 tablet (40 mg total) by mouth daily., Disp: 90 tablet, Rfl: 2   blood glucose meter kit and supplies KIT, Dispense based on patient and insurance preference. Use up to four times daily as directed. (FOR ICD-9 250.00, 250.01)., Disp: 1 each, Rfl: 0   carvedilol (COREG) 12.5 MG tablet, Take 1 tablet (12.5 mg total) by mouth 2 (two) times daily with a meal., Disp: 120 tablet, Rfl: 3   Continuous Blood Gluc Sensor (FREESTYLE LIBRE 2 SENSOR) MISC, Use as directed 3 (three) times daily., Disp: 1 each, Rfl: 11   Insulin Glargine (BASAGLAR KWIKPEN) 100 UNIT/ML, Inject 44 Units into the skin daily., Disp: 15 mL, Rfl: 3   insulin lispro (HUMALOG) 100 UNIT/ML KwikPen, Inject 12 Units into the skin 2 (two) times daily before a meal. Hold if blood sugar is less than 150, Disp: 6 mL, Rfl: 4   Insulin Pen Needle (TRUEPLUS PEN NEEDLES) 31G X 5 MM MISC, use as directed with insulin, Disp: 100 each, Rfl: 3   Lancets MISC, 1 each by Does not apply route 2 (two) times daily., Disp: 100 each, Rfl: 1   metFORMIN (GLUCOPHAGE) 500 MG tablet, Take 2 tablets (1,000 mg total) by mouth 2 (two) times daily., Disp: 120 tablet, Rfl: 2   valsartan-hydrochlorothiazide (DIOVAN-HCT) 320-25 MG tablet, Take 1 tablet by mouth daily., Disp: 90 tablet, Rfl: 1   Medications ordered in this encounter:  No orders of the defined  types were placed in this encounter.    *If you need refills on other medications prior to your next appointment, please contact your pharmacy*  Follow-Up: Call back or seek an in-person evaluation if the symptoms worsen or if the condition fails to improve as anticipated.  Other Instructions  [facility] Attention Specialist [address] [place], [place] [address] [phone]  Living With Attention Deficit Hyperactivity Disorder If you have been diagnosed with attention deficit hyperactivity disorder (ADHD), you may be relieved that you now know why you have felt or behaved a certain way. Still, you may feel overwhelmed about the treatment ahead. You may also wonder how to get the support you need and how to deal with the condition day-to-day. With treatment and support, you can live with ADHD and manage your symptoms. How to manage lifestyle changes Managing stress Stress is your body's reaction to life changes and events, both good and bad. To cope with the stress of an ADHD diagnosis, it may help to: Learn more about ADHD. Exercise regularly. Even a short daily walk can lower stress levels. Participate in training or education programs (including social skills training classes) that teach you to deal with symptoms.  Medicines Your health care provider may suggest certain medicines if he or she feels that they will help to improve your condition. Stimulant medicines are usually prescribed to treat ADHD, and therapy may also be prescribed. It is important to: Avoid using alcohol and  other substances that may prevent your medicines from working properly (may interact). Talk with your pharmacist or health care provider about all the medicines that you take, their possible side effects, and what medicines are safe to take together. Make it your goal to take part in all treatment decisions (shared decision-making). Ask about possible side effects of medicines that your health care provider  recommends, and tell him or her how you feel about having those side effects. It is best if shared decision-making with your health care provider is part of your total treatment plan. Relationships To strengthen your relationships with family members while treating your condition, consider taking part in family therapy. You might also attend self-help groups alone or with a loved one. Be honest about how your symptoms affect your relationships. Make an effort to communicate respectfully instead of fighting, and find ways to show others that you care. Psychotherapy may be useful in helping you cope with how ADHD affects your relationships. How to recognize changes in your condition The following signs may mean that your treatment is working well and your condition is improving: Consistently being on time for appointments. Being more organized at home and work. Other people noticing improvements in your behavior. Achieving goals that you set for yourself. Thinking more clearly. The following signs may mean that your treatment is not working very well: Feeling impatience or more confusion. Missing, forgetting, or being late for appointments. An increasing sense of disorganization and messiness. More difficulty in reaching goals that you set for yourself. Loved ones becoming angry or frustrated with you. Follow these instructions at home: Take over-the-counter and prescription medicines only as told by your health care provider. Check with your health care provider before taking any new medicines. Create structure and an organized atmosphere at home. For example: Make a list of tasks, then rank them from most important to least important. Work on one task at a time until your listed tasks are done. Make a daily schedule and follow it consistently every day. Use an appointment calendar, and check it 2 or 3 times a day to keep on track. Keep it with you when you leave the house. Create spaces where  you keep certain things, and always put things back in their places after you use them. Keep all follow-up visits as told by your health care provider. This is important. Where to find support Talking to others  Keep emotion out of important discussions and speak in a calm, logical way. Listen closely and patiently to your loved ones. Try to understand their point of view, and try to avoid getting defensive. Take responsibility for the consequences of your actions. Ask that others do not take your behaviors personally. Aim to solve problems as they come up, and express your feelings instead of bottling them up. Talk openly about what you need from your loved ones and how they can support you. Consider going to family therapy sessions or having your family meet with a specialist who deals with ADHD-related behavior problems. Finances Not all insurance plans cover mental health care, so it is important to check with your insurance carrier. If paying for co-pays or counseling services is a problem, search for a local or county mental health care center. Public mental health care services may be offered there at a low cost or no cost when you are not able to see a private health care provider. If you are taking medicine for ADHD, you may be able to get the  generic form, which may be less expensive than brand-name medicine. Some makers of prescription medicines also offer help to patients who cannot afford the medicines that they need. Questions to ask your health care provider: What are the risks and benefits of taking medicines? Would I benefit from therapy? How often should I follow up with a health care provider? Contact a health care provider if: You have side effects from your medicines, such as: Repeated muscle twitches, coughing, or speech outbursts. Sleep problems. Loss of appetite. Depression. New or worsening behavior problems. Dizziness. Unusually fast heartbeat. Stomach  pains. Headaches. Get help right away if: You have a severe reaction to a medicine. Your behavior suddenly gets worse. Summary With treatment and support, you can live with ADHD and manage your symptoms. The medicines that are most often prescribed for ADHD are stimulants. Consider taking part in family therapy or self-help groups with family members or friends. When you talk with friends and family about your ADHD, be patient and communicate openly. Take over-the-counter and prescription medicines only as told by your health care provider. Check with your health care provider before taking any new medicines. This information is not intended to replace advice given to you by your health care provider. Make sure you discuss any questions you have with your health care provider. Document Revised: 03/25/2020 Document Reviewed: 04/27/2020 Elsevier Patient Education  [place].    If you have been instructed to have an in-person evaluation today at a local Urgent Care facility, please use the link below. It will take you to a list of all of our available [facility], including address, phone number and hours of operation. Please do not delay care.  [facility]  If you or a family member do not have a primary care provider, use the link below to schedule a visit and establish care. When you choose a [facility] primary care physician or advanced practice provider, you gain a long-term partner in health. Find a Primary Care Provider  Learn more about [facility] in-office and virtual care options: [facility]

## 2022-04-27 NOTE — Telephone Encounter (Signed)
Called and left vm about appt  

## 2022-04-27 NOTE — Telephone Encounter (Signed)
Pt will need OV to assess rebook him into next week

## 2022-04-30 ENCOUNTER — Encounter: Payer: Self-pay | Admitting: Pharmacist

## 2022-04-30 ENCOUNTER — Other Ambulatory Visit: Payer: Self-pay

## 2022-04-30 ENCOUNTER — Ambulatory Visit: Payer: Self-pay | Attending: Critical Care Medicine | Admitting: Pharmacist

## 2022-04-30 DIAGNOSIS — Z794 Long term (current) use of insulin: Secondary | ICD-10-CM

## 2022-04-30 DIAGNOSIS — E1165 Type 2 diabetes mellitus with hyperglycemia: Secondary | ICD-10-CM

## 2022-04-30 MED ORDER — OZEMPIC (0.25 OR 0.5 MG/DOSE) 2 MG/3ML ~~LOC~~ SOPN
0.2500 mg | PEN_INJECTOR | SUBCUTANEOUS | 1 refills | Status: DC
  Filled 2022-04-30: qty 3, 28d supply, fill #0

## 2022-04-30 MED ORDER — FREESTYLE LIBRE 2 SENSOR MISC: 1.0000 | Freq: Three times a day (TID) | 3 refills | Status: DC

## 2022-04-30 NOTE — Progress Notes (Signed)
    S:    PCP: Dr. Delford Field   No chief complaint on file.  Patient arrives good spirits.  Presents for diabetes evaluation, education, and management. Patient was referred and last seen by Primary Care Provider on 10/23/2021. He was last seen in clinic by Georgian Co on 03/28/2022. A1c revealed improved glycemia control but was still above goal. He reported compliance with insulin and his regimen was adjusted to 44u of Basaglar daily + 12u BID of Humalog with meals.  Today, patient reports doing well. He does admit that there is a lot of stress and anxiety related to his home stressors. 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  and stress. He tells [place] he has a lot going on but seems motivated to make some changes.   Family/Social History: DM (father); never smoker, denies alcohol intake.  Insurance coverage/medication affordability: self-pay   Patient reports adherence with medications.  Current diabetes medications include: Humalog 12 units BID, Basaglar 44 units daily (taking 40u), metformin 1000 mg BID  Patient reports hypoglycemic events. Able to treat successfully.   Patient reported dietary habits:  -Has been missing meals recently. Attributes occasional lows for this.    Patient-reported exercise habits:  -Not as active.   Patient denies nocturia.  Patient denies neuropathy. Patient denies visual changes. Patient reports self foot exams.    O:  Lab Results  Component Value Date   HGBA1C 9.9 (A) 03/28/2022   Patient reported home CBGs:  Home fasting CBG: 1 outlier of 71. 170s - 180s Home post-prandial/random CBGs: 200s  Clinical ASCVD: No  The ASCVD Risk score Denman George DC Jr., et al., 2013) failed to calculate for the following reasons:   The 2013 ASCVD risk score is only valid for ages 34 to 50   A/P: Diabetes longstanding currently uncontrolled based on A1c but home CBGs are improving. Patient is able to verbalize appropriate hypoglycemia management  plan. Confirms adherence to medications.  -Increase Basaglar to 44 units daily as prescribed.  -Continue Humalog 12u before meals BID.   -Continue metformin 1000 mg BID.  -Start Ozempic 0.25 mg once weekly.  -Extensively discussed pathophysiology of DM, recommended lifestyle interventions, dietary effects on glycemic control -Counseled on s/sx of and management of hypoglycemia -Next A1c anticipated 06/2022  Written patient instructions provided.  Total time in face to face counseling 20 minutes. Follow up in 1 month.   Butch Penny, PharmD, P[place]y [place], CPP Clinical Pharmacist [facility] [phone]

## 2022-05-01 ENCOUNTER — Telehealth: Payer: Self-pay | Admitting: Pharmacist

## 2022-05-01 NOTE — Telephone Encounter (Signed)
Needs OV with me first  I asked the team to set him up with me sooner.  Pls move his Aug appt up IN PERSON

## 2022-05-01 NOTE — Telephone Encounter (Signed)
Dr. Delford Field,   Patient was seen by a [facility] provider on 04/26/2022 with concerns of attention deficit/lack of focus that is impacting his work performance. I saw him yesterday for DM management. He calls today requesting a referral to behavioral health. I told him I would let you know but you may have to see him first. If this is the case, he wonders if he can schedule a virtual visit with you sooner than his current OV scheduled in Aug.   I can call and let him know your instructions if appropriate.

## 2022-05-01 NOTE — Telephone Encounter (Signed)
Hey friend,   Is is possible to move up this patient's visit with Dr. Delford Field to his soonest he has available in person?

## 2022-05-18 ENCOUNTER — Telehealth: Payer: Self-pay | Admitting: Critical Care Medicine

## 2022-05-21 ENCOUNTER — Other Ambulatory Visit: Payer: Self-pay

## 2022-05-25 ENCOUNTER — Encounter: Payer: Self-pay | Admitting: Critical Care Medicine

## 2022-05-27 NOTE — Telephone Encounter (Signed)
Book this pt in with me in July in 2-3 weeks, virtual visit pls

## 2022-06-04 ENCOUNTER — Ambulatory Visit: Payer: Self-pay | Admitting: Pharmacist

## 2022-06-04 ENCOUNTER — Telehealth: Payer: Self-pay | Admitting: Pharmacist

## 2022-06-04 ENCOUNTER — Other Ambulatory Visit: Payer: Self-pay

## 2022-06-04 NOTE — Telephone Encounter (Signed)
Dr Delford Field,   Had appt scheduled with this patient today. At 04/30/2022, his A1c showed improvement (down to 9.9 from >12). He seemed more engaged and willing to make changes. Requested to go back on Trulicity but we placed him on Ozempic instead for patient assistance. We are still having issues with Trulicity patient assistance. He no-showed today and I found out from [place] that he never finished the application. Therefore, he never picked-up the Ozempic. I wanted to let you know because he is scheduled to see you tomorrow.

## 2022-06-05 ENCOUNTER — Ambulatory Visit: Payer: Self-pay | Attending: Critical Care Medicine | Admitting: Critical Care Medicine

## 2022-06-05 ENCOUNTER — Encounter: Payer: Self-pay | Admitting: Critical Care Medicine

## 2022-06-05 DIAGNOSIS — Z91199 Patient's noncompliance with other medical treatment and regimen due to unspecified reason: Secondary | ICD-10-CM

## 2022-06-05 DIAGNOSIS — Z794 Long term (current) use of insulin: Secondary | ICD-10-CM

## 2022-06-05 DIAGNOSIS — E1165 Type 2 diabetes mellitus with hyperglycemia: Secondary | ICD-10-CM

## 2022-06-05 MED ORDER — TADALAFIL 20 MG PO TABS: 10.0000 mg | ORAL_TABLET | ORAL | 11 refills | Status: AC | PRN

## 2022-06-05 MED ORDER — OZEMPIC (0.25 OR 0.5 MG/DOSE) 2 MG/3ML ~~LOC~~ SOPN: 0.2500 mg | PEN_INJECTOR | SUBCUTANEOUS | 1 refills | Status: DC

## 2022-06-05 NOTE — Telephone Encounter (Signed)
Meds sent to new pharmacy 

## 2022-06-05 NOTE — Progress Notes (Signed)
No show for appt. 

## 2022-06-11 ENCOUNTER — Ambulatory Visit: Payer: Self-pay | Admitting: Critical Care Medicine

## 2022-06-20 ENCOUNTER — Ambulatory Visit: Payer: Self-pay | Admitting: *Deleted

## 2022-06-20 NOTE — Telephone Encounter (Signed)
Reason for Disposition  [1] Vomiting AND [2] contains red blood or black ("coffee ground") material  (Exception: Few red streaks in vomit that only happened once.)  Answer Assessment - Initial Assessment Questions 1. VOMITING SEVERITY: "How many times have you vomited in the past 24 hours?"     - MILD:  1 - 2 times/day    - MODERATE: 3 - 5 times/day, decreased oral intake without significant weight loss or symptoms of dehydration    - SEVERE: 6 or more times/day, vomits everything or nearly everything, with significant weight loss, symptoms of dehydration      mild 2. ONSET: "When did the vomiting begin?"      Friday 3. FLUIDS: "What fluids or food have you vomited up today?" "Have you been able to keep any fluids down?"     Keeping fluids down- water 4. ABDOMEN PAIN: "Are your having any abdomen pain?" If Yes : "How bad is it and what does it feel like?" (e.g., crampy, dull, intermittent, constant)      Lower L abdominal pain- force of vomiting causes the pain 5. DIARRHEA: "Is there any diarrhea?" If Yes, ask: "How many times today?"      Yes-once 6. CONTACTS: "Is there anyone else in the family with the same symptoms?"      Employees at work have had stomach bug 7. CAUSE: "What do you think is causing your vomiting?"     unsure 8. HYDRATION STATUS: "Any signs of dehydration?" (e.g., dry mouth [not only dry lips], too weak to stand) "When did you last urinate?"     Weak, dizzy, urinated this am 9. OTHER SYMPTOMS: "Do you have any other symptoms?" (e.g., fever, headache, vertigo, vomiting blood or coffee grounds, recent head injury)     Fever, blood seen in vomit 10. PREGNANCY: "Is there any chance you are pregnant?" "When was your last menstrual period?"  Protocols used: Vomiting-A-AH

## 2022-06-20 NOTE — Telephone Encounter (Addendum)
  Chief Complaint: abdominal pain,vomiting- blood in vomit, decrease glucose level- 70- dizziness, headache Symptoms: red blood in vomit- last 3 times, abdominal pain, fever at night Frequency: started Friday Pertinent Negatives: Patient denies   Disposition: [x] ED /[] Urgent Care (no appt availability in office) / [] Appointment(In office/virtual)/ []  [facility]/ [] Home Care/ [] Refused Recommended Disposition /[] [facility]/ []  Follow-up with PCP Additional Notes: Patient has moved out of town- he does not have insurance yet- advised ED

## 2022-06-21 NOTE — Telephone Encounter (Signed)
Agree he needs ED   [place]

## 2022-07-02 ENCOUNTER — Ambulatory Visit: Payer: Self-pay | Admitting: Critical Care Medicine

## 2022-07-09 ENCOUNTER — Telehealth: Payer: Self-pay | Admitting: Critical Care Medicine

## 2022-07-09 NOTE — Telephone Encounter (Signed)
From last conversation with Dr. Delford Field, patient is moving and will not be able to keep Dr. Delford Field as his PCP. Therefore, I cannot see him.

## 2022-07-09 NOTE — Telephone Encounter (Signed)
Copied from CRM [id number]. Topic: General - Inquiry >> Jul 09, 2022  2:27 PM Marlow Baars wrote: Reason for CRM: Patient would like to set up a virtual appointment with [name] if possible. He has moved to [facility] and because of the distance virtual would be best. Please assist patient further

## 2022-07-09 NOTE — Telephone Encounter (Signed)
Copied from CRM [id number]. Topic: General - Inquiry >> Jul 09, 2022  2:29 PM Marlow Baars wrote: Reason for CRM: Patient would also like to speak to a Child psychotherapist as soon as possible please. Please assist patient further

## 2022-07-09 NOTE — Telephone Encounter (Signed)
Copied from CRM #[id number]. Topic: General - Inquiry >> Jul 09, 2022  2:27 PM Jason S wrote: Reason for CRM: Patient would like to set up a virtual appointment with Luke if possible. He has moved to [place] and because of the distance virtual would be best. Please assist patient further 

## 2022-07-10 ENCOUNTER — Telehealth: Payer: Self-pay | Admitting: Critical Care Medicine

## 2022-07-10 ENCOUNTER — Encounter: Payer: Self-pay | Admitting: Critical Care Medicine

## 2022-07-10 ENCOUNTER — Ambulatory Visit: Payer: Self-pay | Admitting: *Deleted

## 2022-07-10 ENCOUNTER — Telehealth (HOSPITAL_BASED_OUTPATIENT_CLINIC_OR_DEPARTMENT_OTHER): Payer: Self-pay | Admitting: Critical Care Medicine

## 2022-07-10 DIAGNOSIS — F909 Attention-deficit hyperactivity disorder, unspecified type: Secondary | ICD-10-CM | POA: Insufficient documentation

## 2022-07-10 DIAGNOSIS — E1165 Type 2 diabetes mellitus with hyperglycemia: Secondary | ICD-10-CM

## 2022-07-10 DIAGNOSIS — F900 Attention-deficit hyperactivity disorder, predominantly inattentive type: Secondary | ICD-10-CM

## 2022-07-10 DIAGNOSIS — Z794 Long term (current) use of insulin: Secondary | ICD-10-CM

## 2022-07-10 NOTE — Telephone Encounter (Signed)
Copied from CRM [id number]. Topic: Appointment Scheduling - Scheduling Inquiry for Clinic >> Jul 10, 2022  2:19 PM [place] B wrote: Reason for YCX:KGYJEHU has a video appt today at 2pm. He tried to log in for an appt but there was a delay afterwards and no one ever came on.

## 2022-07-10 NOTE — Telephone Encounter (Signed)
Copied from CRM [id number]. Topic: General - Other >> Jul 10, 2022 10:33 AM Everette C wrote: Reason for CRM: The patient has returned a missed call from the practice  Please contact the patient further when possible  The patient previously requested to speak with a Child psychotherapist

## 2022-07-10 NOTE — Telephone Encounter (Signed)
Patient had appointment today.

## 2022-07-10 NOTE — Progress Notes (Signed)
Established Patient Office Visit  Subjective   Patient ID: Anthony Hubbard, male    DOB: 06-12-1986  Age: 35 y.o. MRN: [id number] Virtual Visit via Video Note  I connected with Anthony Hubbard  on 07/10/22 at2pm by a video enabled telemedicine application and verified that I am speaking with the correct person using two identifiers.   Consent:  I discussed the limitations, risks, security and privacy concerns of performing an evaluation and management service by video visit and the availability of in person appointments. I also discussed with the patient that there may be a patient responsible charge related to this service. The patient expressed understanding and agreed to proceed.  Location of patient: Patient at work  Location of provider: I am in my office  Persons participating in the televisit with the patient.   No one else in the call    History of Present Illness:   Trouble focusing  HPI: This is a fit 36 year old male who is seen by way of a video visit he is seen resting comfortably at his workplace no one else is in the room he is having progressive increase in difficulty focusing on task at work he does not denies any prior known history of ADHD he was raised in a conservative Hispanic family who did not believe in mental health services.  The patient now lives in [place] but he will be back in [place] in a week.  He is willing to go to [facility] behavioral health for evaluations when he goes back to this area.  He states his blood sugars often are 70-80 before eating and after eating 200-2 50.  He has been compliant with his insulin program.    Review of Systems  Constitutional:  Negative for chills, diaphoresis, fever, malaise/fatigue and weight loss.  HENT:  Negative for congestion, ear discharge, ear pain, hearing loss, nosebleeds, sore throat and tinnitus.   Eyes:  Negative for blurred vision, double vision, photophobia and discharge.   Respiratory:  Negative for cough, hemoptysis, sputum production, shortness of breath, wheezing and stridor.        No excess mucus  Cardiovascular:  Negative for chest pain, palpitations, orthopnea, claudication, leg swelling and PND.  Gastrointestinal:  Negative for abdominal pain, blood in stool, constipation, diarrhea, heartburn, melena, nausea and vomiting.  Genitourinary:  Negative for dysuria, flank pain, frequency, hematuria and urgency.  Musculoskeletal:  Negative for back pain, falls, joint pain, myalgias and neck pain.  Skin:  Negative for itching and rash.  Neurological:  Negative for dizziness, tingling, tremors, sensory change, speech change, focal weakness, seizures, loss of consciousness, weakness and headaches.  Endo/Heme/Allergies:  Negative for environmental allergies and polydipsia. Does not bruise/bleed easily.  Psychiatric/Behavioral:  Negative for depression, hallucinations, memory loss, substance abuse and suicidal ideas. The patient is nervous/anxious. The patient does not have insomnia.        Lack of focus  All other systems reviewed and are negative.     Objective:     There were no vitals taken for this visit.   Physical Exam  No exam this is a video visit No results found for any visits on 07/10/22.    The ASCVD Risk score (Arnett DK, et al., 2019) failed to calculate for the following reasons:   The 2019 ASCVD risk score is only valid for ages 37 to 34    Assessment & Plan:   Problem List Items Addressed This Visit       Endocrine  Type 2 diabetes mellitus with hyperglycemia, with long-term current use of insulin (HCC)    We will have patient come in to see clinical pharmacy in a week        Other   ADHD    Patient has significant lack of focus on concern this may be representative of his potential ADHD I am asking him to go to [facility] behavioral health for evaluation of same       Follow Up Instructions: Patient knows a  follow-up exam will occur both with [name] our clinical pharmacist next week Referral to neurology was made I discussed the assessment and treatment plan with the patient. The patient was provided an opportunity to ask questions and all were answered. The patient agreed with the plan and demonstrated an understanding of the instructions.   The patient was advised to call back or seek an in-person evaluation if the symptoms worsen or if the condition fails to improve as anticipated.  I provided 32 minutes of non-face-to-face time during this encounter  including  median intraservice time , review of notes, labs, imaging, medications  and explaining diagnosis and management to the patient .    Shan Levans, MD

## 2022-07-10 NOTE — Telephone Encounter (Signed)
What kind of nurse are you?    I need to speak with someone who specializes in ADD or ADHD or something like that.  I let him know I would be glad to set up an appt. With a provider here at [facility] for him to consult for this.   He was agreeable.    I'm having trouble focusing and it's getting worse.   I'm going from job to job.   I need to talk with someone about this.    I made him an appt. For today at 2:00 with Dr. Delford Field via virtual visit.   [place] [address].

## 2022-07-10 NOTE — Assessment & Plan Note (Signed)
We will have patient come in to see clinical pharmacy in a week

## 2022-07-10 NOTE — Assessment & Plan Note (Signed)
Patient has significant lack of focus on concern this may be representative of his potential ADHD I am asking him to go to [facility] behavioral health for evaluation of same

## 2022-07-10 NOTE — Patient Instructions (Signed)
[facility] [address], [place], [place] [address] [phone] or [phone] Walk-in urgent care 24/7 for anyone  For [place] Residents ONLY New patient assessments and therapy walk-ins: Monday and Wednesday 8am-11am First and second Friday 1pm-5pm New patient psychiatry and medication management walk-ins: Mondays, Wednesdays, Thursdays, Fridays 8am-11am No psychiatry walk-ins on Tuesday    

## 2022-07-10 NOTE — Telephone Encounter (Signed)
Message from [place] sent at 07/10/2022 10:46 AM EDT  Summary: trouble focusing   Patient called in states, has trouble focusing and its affecting his job.           Call History   Type Contact Phone/Fax User  07/10/2022 10:45 AM EDT Phone (Incoming) Symeon, Puleo (Self) [phone] Judie Petit) Benton, Deedra Ehrich   Reason for Disposition  Nursing judgment  Protocols used: No Guideline or Reference Available-A-AH  Chief Complaint: trouble focusing told he may have ADD and wants to talk with someone about it. Symptoms: Struggling with going job to job and this has been going on a long time. Frequency: Getting worse.  Requesting to speak with someone about having ADD  Needing an appt. Pertinent Negatives: Patient denies N/A Disposition: [] ED /[] Urgent Care (no appt availability in office) / [x] Appointment(In office/virtual)/ []  [facility]/ [] Home Care/ [] Refused Recommended Disposition /[] [facility]/ []  Follow-up with PCP Additional Notes: A virtual appt was made with Dr. for today at 2:00 at [facility].   (Pt is living out of town by ).

## 2022-07-19 ENCOUNTER — Telehealth: Payer: Self-pay | Admitting: Pharmacist

## 2022-07-19 ENCOUNTER — Other Ambulatory Visit: Payer: Self-pay

## 2022-07-19 ENCOUNTER — Other Ambulatory Visit: Payer: Self-pay | Admitting: Pharmacist

## 2022-07-19 ENCOUNTER — Ambulatory Visit: Payer: Self-pay | Attending: Critical Care Medicine | Admitting: Pharmacist

## 2022-07-19 DIAGNOSIS — Z794 Long term (current) use of insulin: Secondary | ICD-10-CM

## 2022-07-19 DIAGNOSIS — E1165 Type 2 diabetes mellitus with hyperglycemia: Secondary | ICD-10-CM

## 2022-07-19 MED ORDER — OZEMPIC (0.25 OR 0.5 MG/DOSE) 2 MG/3ML ~~LOC~~ SOPN
0.2500 mg | PEN_INJECTOR | SUBCUTANEOUS | 1 refills | Status: DC
Start: 2022-07-19 — End: 2022-07-20
  Filled 2022-07-19: qty 3, fill #0

## 2022-07-19 MED ORDER — SERTRALINE HCL 50 MG PO TABS: 50.0000 mg | ORAL_TABLET | Freq: Every day | ORAL | 3 refills | Status: DC

## 2022-07-19 MED ORDER — SERTRALINE HCL 50 MG PO TABS
50.0000 mg | ORAL_TABLET | Freq: Every day | ORAL | 3 refills | Status: DC
  Filled 2022-07-19: qty 30, 30d supply, fill #0

## 2022-07-19 NOTE — Telephone Encounter (Signed)
Dr. Delford Field,   Patient endorses symptoms of anxiety and is wanting a daily medication to help with longterm control. Would you be comfortable writing this for him?

## 2022-07-19 NOTE — Progress Notes (Signed)
    S:    PCP: Dr. Delford Field   No chief complaint on file.  Patient arrives good spirits.  Presents for diabetes evaluation, education, and management. Patient was referred and last seen by Primary Care Provider on 07/10/2022.   Today, patient reports doing better with DM but admits to increased stress, anxiety, lack of focus when working. He is interested in starting a long-term medication to help control anxiety better.   Family/Social History: DM (father); never smoker, denies alcohol intake.  Insurance coverage/medication affordability: self-pay   Patient reports adherence with medications except for Ozempic. He has not completed the patient assistance program application.   Current diabetes medications include: Humalog 12 units BID, Basaglar 44 units daily, metformin 1000 mg BID, Ozempic 0.25 mg weekly (not taking)  Patient denies hypoglycemic events.   Patient reported dietary habits:  -Tries to remain dietary compliant but admits to occasional dietary indiscretion    Patient-reported exercise habits:  -None outside of work   Patient denies nocturia.  Patient denies neuropathy. Patient denies visual changes. Patient reports self foot exams.   Patient reported home CBGs:  Home fasting CBG: 80s-low 100s Home post-prandial/random CBGs: 200s   O:  Lab Results  Component Value Date   HGBA1C 9.9 (A) 03/28/2022   Clinical ASCVD: No  The ASCVD Risk score Denman George DC Jr., et al., 2013) failed to calculate for the following reasons:   The 2013 ASCVD risk score is only valid for ages 15 to 59   A/P: Diabetes longstanding currently uncontrolled based on A1c but home CBGs are improving. Patient is able to verbalize appropriate hypoglycemia management plan. Confirms adherence to medications.  -Continue Basaglar 44 units daily as prescribed.  -Continue Humalog 12u before meals BID.   -Continue metformin 1000 mg BID.  -Start Ozempic 0.25 mg once weekly. Patient directed to the  pharmacy to complete his patient assistance application.  -Extensively discussed pathophysiology of DM, recommended lifestyle interventions, dietary effects on glycemic control -Counseled on s/sx of and management of hypoglycemia -Next A1c anticipated at follow-up in 1 month.  Written patient instructions provided.  Total time in face to face counseling 20 minutes. Follow up in 1 month.   Butch Penny, PharmD, P[place]y [place], CPP Clinical Pharmacist [facility] [phone]

## 2022-07-19 NOTE — Addendum Note (Signed)
Addended by: Storm Frisk on: 07/19/2022 04:49 PM   Modules accepted: Orders

## 2022-07-19 NOTE — Telephone Encounter (Signed)
I sent Rx to his local pharmacy for sertraline , can you let him know?

## 2022-07-20 ENCOUNTER — Other Ambulatory Visit: Payer: Self-pay | Admitting: Pharmacist

## 2022-07-20 ENCOUNTER — Other Ambulatory Visit: Payer: Self-pay

## 2022-07-20 MED ORDER — TRULICITY 0.75 MG/0.5ML ~~LOC~~ SOAJ
0.7500 mg | SUBCUTANEOUS | 1 refills | Status: DC
  Filled 2022-07-20: qty 2, 28d supply, fill #0

## 2022-07-23 ENCOUNTER — Telehealth: Payer: Self-pay | Admitting: Critical Care Medicine

## 2022-07-23 ENCOUNTER — Encounter: Payer: Self-pay | Admitting: Critical Care Medicine

## 2022-07-23 NOTE — Telephone Encounter (Signed)
LCSWA called patient today. Patient called previously stating that he was having troubles with focusing. LCSWA asked about his new medication. He stated he has only been taking it for a few days and doesn't notice much change just yet. Patient stated sometimes he gets so so overwhelmed he can't complete his task at work. LCSWA scheduled patient for a telephone appointment on 08/01/22 @ 11 for coping skills on reducing his stress, to assist with overcome feeling overwhelmed.

## 2022-07-23 NOTE — Telephone Encounter (Signed)
Pt is calling to request a letter from [facility] or Dr. Delford Field to state that it is ok for him to have an Emotional Support animal at his [employer] complex. CB- [phone]

## 2022-07-23 NOTE — Telephone Encounter (Signed)
I will send a letter 

## 2022-07-24 ENCOUNTER — Telehealth: Payer: Self-pay | Admitting: Critical Care Medicine

## 2022-07-24 NOTE — Telephone Encounter (Signed)
Copied from CRM [id number]. Topic: General - Other >> Jul 24, 2022  9:49 AM Macon Large wrote: Reason for CRM:Pt reports that he is moving to a new apartment and he has an emotional support animal. Pt requests that a verification of need letter be prepared and e-mailed to him today at [email or web address]

## 2022-07-25 NOTE — Telephone Encounter (Signed)
Patient called and fax was sent to email.

## 2022-07-25 NOTE — Telephone Encounter (Signed)
Called patient and he will call back with the fax number for his apartment and letter will be faxed

## 2022-07-25 NOTE — Telephone Encounter (Signed)
Please see pervious message

## 2022-08-01 ENCOUNTER — Encounter: Payer: Self-pay | Admitting: Licensed Clinical Social Worker

## 2022-08-21 ENCOUNTER — Ambulatory Visit: Payer: Self-pay | Admitting: Pharmacist

## 2022-10-08 DIAGNOSIS — N200 Calculus of kidney: Secondary | ICD-10-CM | POA: Insufficient documentation

## 2022-11-29 ENCOUNTER — Other Ambulatory Visit: Payer: Self-pay

## 2022-12-05 ENCOUNTER — Other Ambulatory Visit: Payer: Self-pay

## 2022-12-17 ENCOUNTER — Telehealth: Payer: Self-pay | Admitting: Critical Care Medicine

## 2022-12-17 NOTE — Telephone Encounter (Signed)
Copied from [place] [id number]. Topic: Appointment Scheduling - Scheduling Inquiry for Clinic >> Dec 17, 2022  3:17 PM Erskine Squibb wrote: Reason for CRM: The patient called in stating he moved briefly to [facility] but is now back. He states while he was gone he had a few surgeries as well as prescription changes. He would like to see Lurena Joiner to go over all of this as soon as possible. He was last seen in August and he states due to not having insurance he has not been able to get and take his medicine for weeks. Please assist patient further

## 2022-12-20 ENCOUNTER — Telehealth: Payer: Self-pay

## 2022-12-20 NOTE — Telephone Encounter (Signed)
Patient called back and he was scheduled with luke per request    Patient has appointment In march with wright

## 2022-12-20 NOTE — Telephone Encounter (Signed)
Called patient and left voicemail to make appointments

## 2022-12-21 ENCOUNTER — Other Ambulatory Visit: Payer: Self-pay

## 2022-12-21 ENCOUNTER — Ambulatory Visit: Payer: Self-pay | Attending: Critical Care Medicine | Admitting: Pharmacist

## 2022-12-21 ENCOUNTER — Encounter: Payer: Self-pay | Admitting: Pharmacist

## 2022-12-21 VITALS — BP 152/85

## 2022-12-21 DIAGNOSIS — Z794 Long term (current) use of insulin: Secondary | ICD-10-CM

## 2022-12-21 DIAGNOSIS — E785 Hyperlipidemia, unspecified: Secondary | ICD-10-CM

## 2022-12-21 DIAGNOSIS — E1169 Type 2 diabetes mellitus with other specified complication: Secondary | ICD-10-CM

## 2022-12-21 DIAGNOSIS — I10 Essential (primary) hypertension: Secondary | ICD-10-CM

## 2022-12-21 DIAGNOSIS — E1165 Type 2 diabetes mellitus with hyperglycemia: Secondary | ICD-10-CM

## 2022-12-21 MED ORDER — SEMAGLUTIDE (2 MG/DOSE) 8 MG/3ML ~~LOC~~ SOPN
2.0000 mg | PEN_INJECTOR | SUBCUTANEOUS | 3 refills | Status: AC
  Filled 2022-12-21: qty 3, 28d supply, fill #0

## 2022-12-21 MED ORDER — TRUEPLUS PEN NEEDLES 31G X 5 MM MISC
3 refills | Status: AC
  Filled 2022-12-21: qty 100, 25d supply, fill #0

## 2022-12-21 MED ORDER — BASAGLAR KWIKPEN 100 UNIT/ML ~~LOC~~ SOPN
40.0000 [IU] | PEN_INJECTOR | Freq: Every day | SUBCUTANEOUS | 3 refills | Status: DC
  Filled 2022-12-21: qty 15, 37d supply, fill #0

## 2022-12-21 MED ORDER — ATORVASTATIN CALCIUM 40 MG PO TABS
40.0000 mg | ORAL_TABLET | Freq: Every day | ORAL | 2 refills | Status: AC
  Filled 2022-12-21: qty 30, 30d supply, fill #0

## 2022-12-21 MED ORDER — LISINOPRIL 10 MG PO TABS
10.0000 mg | ORAL_TABLET | Freq: Every day | ORAL | 3 refills | Status: DC
  Filled 2022-12-21: qty 30, 30d supply, fill #0

## 2022-12-21 MED ORDER — SERTRALINE HCL 50 MG PO TABS
50.0000 mg | ORAL_TABLET | Freq: Every day | ORAL | 3 refills | Status: AC
  Filled 2022-12-21: qty 30, 30d supply, fill #0

## 2022-12-21 MED ORDER — INSULIN LISPRO (1 UNIT DIAL) 100 UNIT/ML (KWIKPEN)
10.0000 [IU] | PEN_INJECTOR | Freq: Three times a day (TID) | SUBCUTANEOUS | 4 refills | Status: AC
  Filled 2022-12-21: qty 9, 30d supply, fill #0

## 2022-12-21 NOTE — Progress Notes (Signed)
S:    PCP: Dr. Joya Gaskins   No chief complaint on file.  Patient arrives good spirits.Presents for diabetes evaluation, education, and management. Patient was referred and last seen by Primary Care Provider on 07/10/2022. I last saw him 08/21/2022. Since that visit, Anthony Hubbard moved to [place], [place], for his job. He spent ~October to December in [place]. He moved back to [place] in December as his job relocated him. Unfortunately, he lost his insurance at that time and has been without ALL medications for about a month.   He reports that he is doing so much better from a mental health standpoint. Zoloft worked well for him. He also established primary care in [facility] and was started on Vyvanse for ADHD. Since then, he has experienced success at his job and improvement in his health overall. In fact, he tells me today that his BP was managed on lisinopril alone during this time. Additionally, he wonders if we can refill his Vyvanse until he can be referred to Neurology here.   Today, he confirms that he's been without medication and has a couple of concerns. He was hospitalized 11/12 - 08/67/6195 with UTI complicated by obstructive left nephrolithiasis. He is requesting medication for some residual back pain.   Family/Social History:  -Fhx: DM (father) -Tobacco: never smoker -Alcohol: denies alcohol intake.  Insurance coverage/medication affordability: self-pay   Patient denies adherence with medications. Ran out of them all ~1 month ago.   Diabetes medications: we have Basaglar 44u daily, Humalog 09T BID, and Trulicity listed in his profile. He was taking Basaglar 40u daily + Ozempic 2mg  once a week with good control under [place]. Tells me when he was on these, his CBG at home stayed in the 100s.  HTN medications we have on file: amlodipine 10 mg daily, carvedilol 25 mg BID, valsartan-HCTZ 320-25 mg daily. Patient was only taking 10 mg of lisinopril  daily when under the care of [facility] [place]. He tells me his BP was well-controlled on lisinopril only.  Patient denies hypoglycemic events.   Patient reported dietary habits:  -Tries to remain dietary compliant but admits to occasional dietary indiscretion    Patient-reported exercise habits:  -None outside of work   Patient denies nocturia.  Patient denies neuropathy. Patient denies visual changes. Patient reports self foot exams.   Patient reported home CBGs:  Not checking since running out of medications.    O:  Lab Results  Component Value Date   HGBA1C 9.9 (A) 03/28/2022   Clinical ASCVD: No  The ASCVD Risk score Mikey Bussing DC Jr., et al., 2013) failed to calculate for the following reasons:   The 2013 ASCVD risk score is only valid for ages 29 to 59   A/P: Diabetes longstanding w/ current degree of control unknown. Will get an updated A1c today. Will have him resume Basaglar and Ozempic as he was taking these with good control previously. Will order Humalog to have on hand in case he needs it. Patient is able to verbalize appropriate hypoglycemia management plan.  -Restart Basaglar 40 units daily as prescribed.   -Restart Ozempic 2 mg once weekly. Patient directed to the pharmacy to complete his patient assistance application.  -Humalog 26Z TID before meals ordered for him to have on hand.  -Extensively discussed pathophysiology of DM, recommended lifestyle interventions, dietary effects on glycemic control -Counseled on s/sx of and management of hypoglycemia -A1c, CMP14+eGFR, lipid  HTN: longstanding currently above goal but he  has been without medications. BP goal is <130/80 mmHg. He maintained good control with lisinopril alone since his anxiety has been controlled. Will restart lisinopril only today.  -Restart lisinopril 10 mg daily.  -[id number]  Written patient instructions provided.  Total time in face to face counseling 20 minutes. Follow up in 1 month. PCP  in March.   Benard Halsted, PharmD, [place], [place] [phone]

## 2022-12-22 LAB — HEMOGLOBIN A1C
Est. average glucose Bld gHb Est-mCnc: 229 mg/dL
Hgb A1c MFr Bld: 9.6 % — ABNORMAL HIGH (ref 4.8–5.6)

## 2022-12-22 LAB — CMP14+EGFR
ALT: 30 IU/L (ref 0–44)
AST: 17 IU/L (ref 0–40)
Albumin/Globulin Ratio: 1.8 (ref 1.2–2.2)
Albumin: 4.5 g/dL (ref 4.1–5.1)
Alkaline Phosphatase: 85 IU/L (ref 44–121)
BUN/Creatinine Ratio: 9 (ref 9–20)
BUN: 10 mg/dL (ref 6–20)
Bilirubin Total: 0.4 mg/dL (ref 0.0–1.2)
CO2: 21 mmol/L (ref 20–29)
Calcium: 9.5 mg/dL (ref 8.7–10.2)
Chloride: 100 mmol/L (ref 96–106)
Creatinine, Ser: 1.08 mg/dL (ref 0.76–1.27)
Globulin, Total: 2.5 g/dL (ref 1.5–4.5)
Glucose: 347 mg/dL — ABNORMAL HIGH (ref 70–99)
Potassium: 4 mmol/L (ref 3.5–5.2)
Sodium: 139 mmol/L (ref 134–144)
Total Protein: 7 g/dL (ref 6.0–8.5)
eGFR: 92 mL/min/{1.73_m2} (ref >59)

## 2022-12-22 LAB — LIPID PANEL
Chol/HDL Ratio: 6.3 ratio — ABNORMAL HIGH (ref 0.0–5.0)
Cholesterol, Total: 208 mg/dL — ABNORMAL HIGH (ref 100–199)
HDL: 33 mg/dL — ABNORMAL LOW (ref >39)
LDL Chol Calc (NIH): 90 mg/dL (ref 0–99)
Triglycerides: 516 mg/dL — ABNORMAL HIGH (ref 0–149)
VLDL Cholesterol Cal: 85 mg/dL — ABNORMAL HIGH (ref 5–40)

## 2022-12-26 ENCOUNTER — Telehealth: Payer: Self-pay | Admitting: Critical Care Medicine

## 2022-12-26 NOTE — Telephone Encounter (Signed)
Hey friend - can we call this patient back? There are two medications, one for pain and another for ADHD that I asked Dr. Wright about last week. Dr. Wright will need to see him before he can prescribe these. Can we let the patient know that? If he has any concerns or questions about those two medications specifically, he will need to send a MyChart message to Dr. Wright.   

## 2022-12-26 NOTE — Telephone Encounter (Signed)
Copied from [place] [id number]. Topic: General - Other >> Dec 26, 2022 10:23 AM Sabas Sous wrote: Reason for CRM: Pt called per request of [facility] regarding his medications. Says he was told that Lurena Joiner was going to talk to Dr. Joya Gaskins about his medications to see exactly what he should be taking. Pt is requesting a call back to discuss, please advise   Best contact: [phone]

## 2022-12-28 NOTE — Telephone Encounter (Signed)
[place] friend - can we call this patient back? There are two medications, one for pain and another for ADHD that I asked Dr. Joya Gaskins about last week. Dr. Joya Gaskins will need to see him before he can prescribe these. Can we let the patient know that? If he has any concerns or questions about those two medications specifically, he will need to send a MyChart message to Dr. Joya Gaskins.

## 2022-12-28 NOTE — Telephone Encounter (Signed)
Patient is calling to check on the status of this request B- [phone]

## 2023-01-03 NOTE — Telephone Encounter (Signed)
Pt has called back and is going to message Dr Joya Gaskins.

## 2023-01-06 NOTE — Progress Notes (Unsigned)
Established Patient Office Visit  Subjective   Patient ID: Kyzen Rainge, male    DOB: 10/02/1986  Age: 37 y.o. MRN: [id number]   History of Present Illness:   Trouble focusing  HPI: 06/2022 This is a fit 37 year old male who is seen by way of a video visit he is seen resting comfortably at his workplace no one else is in the room he is having progressive increase in difficulty focusing on task at work he does not denies any prior known history of ADHD he was raised in a conservative Hispanic family who did not believe in mental health services.  The patient now lives in [place] but he will be back in [place] in a week.  He is willing to go to [facility] behavioral health for evaluations when he goes back to this area.  He states his blood sugars often are 70-80 before eating and after eating 200-2 50.  He has been compliant with his insulin program.  Radene Journey 11/2022  back in [place] Patient arrives good spirits.Presents for diabetes evaluation, education, and management. Patient was referred and last seen by Primary Care Provider on 07/10/2022. I last saw him 08/21/2022. Since that visit, Colden moved to [place], [place], for his job. He spent ~October to December in [place]. He moved back to [place] in December as his job relocated him. Unfortunately, he lost his insurance at that time and has been without ALL medications for about a month.    He reports that he is doing so much better from a mental health standpoint. Zoloft worked well for him. He also established primary care in [facility] and was started on Vyvanse for ADHD. Since then, he has experienced success at his job and improvement in his health overall. In fact, he tells me today that his BP was managed on lisinopril alone during this time. Additionally, he wonders if we can refill his Vyvanse until he can be referred to Neurology here.    Today, he confirms that he's been without medication  and has a couple of concerns. He was hospitalized 11/12 - [id number] with UTI complicated by obstructive left nephrolithiasis. He is requesting medication for some residual back pain.    Family/Social History:  -Fhx: DM (father) -Tobacco: never smoker -Alcohol: denies alcohol intake.   Insurance coverage/medication affordability: self-pay              Patient denies adherence with medications. Ran out of them all ~1 month ago.   Diabetes medications: we have Basaglar 44u daily, Humalog [id number] BID, and Trulicity listed in his profile. He was taking Basaglar 40u daily + Ozempic 69m once a week with good control under [place] Tells me when he was on these, his CBG at home stayed in the 100s.  HTN medications we have on file: amlodipine 10 mg daily, carvedilol 25 mg BID, valsartan-HCTZ 320-25 mg daily. Patient was only taking 10 mg of lisinopril daily when under the care [place] He tells me his BP was well-controlled on lisinopril only.   Patient denies hypoglycemic events.    Patient reported dietary habits:  -Tries to remain dietary compliant but admits to occasional dietary indiscretion    Patient-reported exercise habits:  -None outside of work    Patient denies nocturia.  Patient denies neuropathy. Patient denies visual changes. Patient reports self foot exams.    Patient reported home CBGs:  Not checking since running out of medications.  O:  Recent Labs       Lab Results  Component Value Date    HGBA1C 9.9 (A) 03/28/2022      Clinical ASCVD: No  The ASCVD Risk score Mikey Bussing DC Jr., et al., 2013) failed to calculate for the following reasons:   The 2013 ASCVD risk score is only valid for ages 52 to 16    A/P: Diabetes longstanding w/ current degree of control unknown. Will get an updated A1c today. Will have him resume Basaglar and Ozempic as he was taking these with good control previously. Will order Humalog to have on hand in case he  needs it. Patient is able to verbalize appropriate hypoglycemia management plan.  -Restart Basaglar 40 units daily as prescribed.   -Restart Ozempic 2 mg once weekly. Patient directed to the pharmacy to complete his patient assistance application.  -Humalog [id number] TID before meals ordered for him to have on hand.  -Extensively discussed pathophysiology of DM, recommended lifestyle interventions, dietary effects on glycemic control -Counseled on s/sx of and management of hypoglycemia -A1c, CMP14+eGFR, lipid   HTN: longstanding currently above goal but he has been without medications. BP goal is <130/80 mmHg. He maintained good control with lisinopril alone since his anxiety has been controlled. Will restart lisinopril only today.  -Restart lisinopril 10 mg daily.  [id number]   Written patient instructions provided.  Total time in face to face counseling 20 minutes. Follow up in 1 month. PCP in March.    Review of Systems  Constitutional:  Negative for chills, diaphoresis, fever, malaise/fatigue and weight loss.  HENT:  Negative for congestion, ear discharge, ear pain, hearing loss, nosebleeds, sore throat and tinnitus.   Eyes:  Negative for blurred vision, double vision, photophobia and discharge.  Respiratory:  Negative for cough, hemoptysis, sputum production, shortness of breath, wheezing and stridor.        No excess mucus  Cardiovascular:  Negative for chest pain, palpitations, orthopnea, claudication, leg swelling and PND.  Gastrointestinal:  Negative for abdominal pain, blood in stool, constipation, diarrhea, heartburn, melena, nausea and vomiting.  Genitourinary:  Negative for dysuria, flank pain, frequency, hematuria and urgency.  Musculoskeletal:  Negative for back pain, falls, joint pain, myalgias and neck pain.  Skin:  Negative for itching and rash.  Neurological:  Negative for dizziness, tingling, tremors, sensory change, speech change, focal weakness, seizures, loss of  consciousness, weakness and headaches.  Endo/Heme/Allergies:  Negative for environmental allergies and polydipsia. Does not bruise/bleed easily.  Psychiatric/Behavioral:  Negative for depression, hallucinations, memory loss, substance abuse and suicidal ideas. The patient is nervous/anxious. The patient does not have insomnia.        Lack of focus  All other systems reviewed and are negative.     Objective:     There were no vitals taken for this visit.   Physical Exam  No exam this is a video visit No results found for any visits on 01/08/23.    The ASCVD Risk score (Arnett DK, et al., 2019) failed to calculate for the following reasons:   The 2019 ASCVD risk score is only valid for ages 68 to 20    Assessment & Plan:   Problem List Items Addressed This Visit   None  Follow Up Instructions: Patient knows a follow-up exam will occur both with Lurena Joiner our clinical pharmacist next week Referral to neurology was made I discussed the assessment and treatment plan with the patient. The patient was provided an opportunity to ask questions and  all were answered. The patient agreed with the plan and demonstrated an understanding of the instructions.   The patient was advised to call back or seek an in-person evaluation if the symptoms worsen or if the condition fails to improve as anticipated.  I provided 32 minutes of non-face-to-face time during this encounter  including  median intraservice time , review of notes, labs, imaging, medications  and explaining diagnosis and management to the patient .    Asencion Noble, MD

## 2023-01-08 ENCOUNTER — Encounter: Payer: Self-pay | Admitting: Critical Care Medicine

## 2023-01-08 ENCOUNTER — Other Ambulatory Visit: Payer: Self-pay

## 2023-01-08 ENCOUNTER — Ambulatory Visit: Payer: Self-pay | Attending: Family Medicine | Admitting: Critical Care Medicine

## 2023-01-08 VITALS — BP 186/120 | HR 86 | Temp 97.7°F | Resp 16 | Ht 71.0 in | Wt 236.0 lb

## 2023-01-08 DIAGNOSIS — Z794 Long term (current) use of insulin: Secondary | ICD-10-CM

## 2023-01-08 DIAGNOSIS — I10 Essential (primary) hypertension: Secondary | ICD-10-CM

## 2023-01-08 DIAGNOSIS — F900 Attention-deficit hyperactivity disorder, predominantly inattentive type: Secondary | ICD-10-CM

## 2023-01-08 DIAGNOSIS — E1169 Type 2 diabetes mellitus with other specified complication: Secondary | ICD-10-CM

## 2023-01-08 DIAGNOSIS — N200 Calculus of kidney: Secondary | ICD-10-CM

## 2023-01-08 DIAGNOSIS — B351 Tinea unguium: Secondary | ICD-10-CM

## 2023-01-08 DIAGNOSIS — E785 Hyperlipidemia, unspecified: Secondary | ICD-10-CM

## 2023-01-08 DIAGNOSIS — E1165 Type 2 diabetes mellitus with hyperglycemia: Secondary | ICD-10-CM

## 2023-01-08 DIAGNOSIS — K029 Dental caries, unspecified: Secondary | ICD-10-CM

## 2023-01-08 DIAGNOSIS — E781 Pure hyperglyceridemia: Secondary | ICD-10-CM

## 2023-01-08 MED ORDER — BASAGLAR KWIKPEN 100 UNIT/ML ~~LOC~~ SOPN: 50.0000 [IU] | PEN_INJECTOR | Freq: Every day | SUBCUTANEOUS | 3 refills | Status: AC

## 2023-01-08 MED ORDER — PANTOPRAZOLE SODIUM 40 MG PO TBEC
40.0000 mg | DELAYED_RELEASE_TABLET | Freq: Every day | ORAL | 2 refills | Status: AC
  Filled 2023-01-08: qty 30, 30d supply, fill #0
  Filled 2023-01-08: qty 60, 60d supply, fill #0

## 2023-01-08 MED ORDER — TAMSULOSIN HCL 0.4 MG PO CAPS
0.4000 mg | ORAL_CAPSULE | Freq: Every day | ORAL | 2 refills | Status: AC
  Filled 2023-01-08: qty 30, 30d supply, fill #0

## 2023-01-08 MED ORDER — VALSARTAN-HYDROCHLOROTHIAZIDE 320-25 MG PO TABS
1.0000 | ORAL_TABLET | Freq: Every day | ORAL | 3 refills | Status: AC
  Filled 2023-01-08: qty 30, 30d supply, fill #0

## 2023-01-08 MED ORDER — AMLODIPINE BESYLATE 5 MG PO TABS
5.0000 mg | ORAL_TABLET | Freq: Every day | ORAL | 2 refills | Status: AC
  Filled 2023-01-08: qty 30, 30d supply, fill #0

## 2023-01-08 NOTE — Assessment & Plan Note (Addendum)
Continue insulin as prescribed except increase Basaglar to 50 units

## 2023-01-08 NOTE — Assessment & Plan Note (Signed)
I told the patient he cannot receive Vyvanse from this practice once he gets insurance we will make a referral to psychiatry

## 2023-01-08 NOTE — Assessment & Plan Note (Signed)
Will eventually need toenail fungus addressed with podiatry once he gets insurance

## 2023-01-08 NOTE — Assessment & Plan Note (Signed)
Blood pressure poorly controlled we will discontinue lisinopril and beginAmlodipine 10 mg daily and valsartan HCT 320/25   patient will return in short-term follow-up

## 2023-01-08 NOTE — Assessment & Plan Note (Signed)
Does not yet have insurance has poor dental care once he gets insurance needs to see a dentist

## 2023-01-08 NOTE — Patient Instructions (Addendum)
Stop lisinopril and begin valsartan HCT and amlodipine for blood pressure  Urinalysis and urine culture will be obtained along with blood counts and urine for protein  A referral to urology will be made in [place]  We are holding off on Vyvanse for now  Return to see [facility] for blood pressure and diabetes check 3 weeks  Return to Dr. Joya Gaskins 2 months

## 2023-01-08 NOTE — Assessment & Plan Note (Signed)
Reassess lipid panel 

## 2023-01-08 NOTE — Assessment & Plan Note (Signed)
Check urinalysis and urine culture and refer to urology that accepts patients without insurance

## 2023-01-08 NOTE — Progress Notes (Signed)
Discuss medication for ADHD and kidney stones Diagnosed with kidney stones and having back pain 4/10 pain, constant, worse with using the restroom  Pain ongoing since December  Feels nausea all the time

## 2023-01-09 LAB — URINALYSIS
Bilirubin, UA: NEGATIVE
Ketones, UA: NEGATIVE
Leukocytes,UA: NEGATIVE
Nitrite, UA: NEGATIVE
Protein,UA: NEGATIVE
RBC, UA: NEGATIVE
Specific Gravity, UA: 1.015 (ref 1.005–1.030)
Urobilinogen, Ur: 0.2 mg/dL (ref 0.2–1.0)
pH, UA: 6 (ref 5.0–7.5)

## 2023-01-09 LAB — RENAL FUNCTION PANEL
Albumin: 4.8 g/dL (ref 4.1–5.1)
BUN/Creatinine Ratio: 10 (ref 9–20)
BUN: 12 mg/dL (ref 6–20)
CO2: 22 mmol/L (ref 20–29)
Calcium: 10.3 mg/dL — ABNORMAL HIGH (ref 8.7–10.2)
Chloride: 100 mmol/L (ref 96–106)
Creatinine, Ser: 1.22 mg/dL (ref 0.76–1.27)
Glucose: 191 mg/dL — ABNORMAL HIGH (ref 70–99)
Phosphorus: 3 mg/dL (ref 2.8–4.1)
Potassium: 4 mmol/L (ref 3.5–5.2)
Sodium: 139 mmol/L (ref 134–144)
eGFR: 80 mL/min/{1.73_m2} (ref >59)

## 2023-01-09 LAB — CBC WITH DIFFERENTIAL/PLATELET
Basophils Absolute: 0.1 10*3/uL (ref 0.0–0.2)
Basos: 1 %
EOS (ABSOLUTE): 0.1 10*3/uL (ref 0.0–0.4)
Eos: 1 %
Hematocrit: 52.1 % — ABNORMAL HIGH (ref 37.5–51.0)
Hemoglobin: 17.6 g/dL (ref 13.0–17.7)
Immature Grans (Abs): 0 10*3/uL (ref 0.0–0.1)
Immature Granulocytes: 0 %
Lymphocytes Absolute: 2.8 10*3/uL (ref 0.7–3.1)
Lymphs: 31 %
MCH: 27.9 pg (ref 26.6–33.0)
MCHC: 33.8 g/dL (ref 31.5–35.7)
MCV: 83 fL (ref 79–97)
Monocytes Absolute: 0.6 10*3/uL (ref 0.1–0.9)
Monocytes: 6 %
Neutrophils Absolute: 5.5 10*3/uL (ref 1.4–7.0)
Neutrophils: 61 %
Platelets: 159 10*3/uL (ref 150–450)
RBC: 6.3 x10E6/uL — ABNORMAL HIGH (ref 4.14–5.80)
RDW: 12.9 % (ref 11.6–15.4)
WBC: 9.1 10*3/uL (ref 3.4–10.8)

## 2023-01-09 LAB — MICROALBUMIN / CREATININE URINE RATIO
Creatinine, Urine: 90.9 mg/dL
Microalb/Creat Ratio: 35 mg/g creat — ABNORMAL HIGH (ref 0–29)
Microalbumin, Urine: 32.2 ug/mL

## 2023-01-10 LAB — URINE CULTURE: Organism ID, Bacteria: NO GROWTH

## 2023-01-10 NOTE — Progress Notes (Signed)
Let patient know urine study is normal kidneys normal blood counts normal urine shows blood sugar elevation but no blood so I doubt kidney stones or active still worth seeing urology

## 2023-01-11 ENCOUNTER — Telehealth: Payer: Self-pay

## 2023-01-11 NOTE — Telephone Encounter (Signed)
-----   Message from Elsie Stain, MD sent at 01/10/2023  5:34 AM EST ----- Let patient know urine study is normal kidneys normal blood counts normal urine shows blood sugar elevation but no blood so I doubt kidney stones or active still worth seeing urology

## 2023-01-11 NOTE — Telephone Encounter (Signed)
Pt was called and vm was left, Information has been sent to nurse pool.   

## 2023-01-14 ENCOUNTER — Other Ambulatory Visit: Payer: Self-pay

## 2023-01-17 ENCOUNTER — Encounter: Payer: Self-pay | Admitting: Urology

## 2023-01-21 ENCOUNTER — Ambulatory Visit: Payer: Self-pay | Admitting: Pharmacist

## 2023-01-30 NOTE — Progress Notes (Deleted)
S:     PCP: Dr. Joya Gaskins  37 y.o. male who presents for diabetes evaluation, education, and management.  PMH is significant for HTN, T2DM, HLD, anxiety and ADHD.   Patient was referred by Primary Care Provider on 07/10/2022. He was seen by pharmacy on 08/21/2022. Since that visit, Shaunte moved to [place], [place], for his job. He spent ~October to December in [place]. He moved back to the [place] in December as his job relocated him.   At his last visit with the CPP, he reported that he had lost his insurance at that time and has been without ALL medications for about a month.   He was then seen by his PCP on 01/08/2023. He reported that he had not been taking his BP medications at that point. Basaglar dose was increased at that time and he was restarted on amlodipine and switched from lisinopril to Diovan HCT.  Today, patient arrives in *** good spirits and presents without *** any assistance. ***  Patient reports Diabetes was diagnosed in ***.   Family/Social History:  -Fhx: DM (father) -Tobacco: never smoker -Alcohol: denies alcohol intake.  Current diabetes medications include: amlodipine 5 mg once daily, valsartan/hctz 320/25 mg once daily Current hypertension medications include: Basaglar 50 units once daily, Humalog 10 units TID with meals, Ozempic 2 mg once weekly Current hyperlipidemia medications include: atorvastatin 40 mg once daily  Patient reports adherence to taking all medications as prescribed.  *** Patient denies adherence with medications, reports missing *** medications *** times per week, on average.  Insurance coverage: self-pay  Patient {Actions; denies-reports:120008} hypoglycemic events.  Reported home fasting blood sugars: ***  Reported 2 hour post-meal/random blood sugars: ***.  Patient {Actions; denies-reports:120008} nocturia (nighttime urination).  Patient {Actions; denies-reports:120008} neuropathy (nerve pain). Patient {Actions;  denies-reports:120008} visual changes. Patient {Actions; denies-reports:120008} self foot exams.   Patient reported dietary habits:  -Tries to remain dietary compliant but admits to occasional dietary indiscretion    Patient-reported exercise habits:  -None outside of work    O:   ROS  Physical Exam  7 day average blood glucose: ***  *** CGM Download:  % Time CGM is active: ***% Average Glucose: *** mg/dL Glucose Management Indicator: ***  Glucose Variability: *** (goal <36%) Time in Goal:  - Time in range 70-180: ***% - Time above range: ***% - Time below range: ***% Observed patterns:   Lab Results  Component Value Date   HGBA1C 9.6 (H) 12/21/2022   There were no vitals filed for this visit.  Lipid Panel     Component Value Date/Time   CHOL 208 (H) 12/21/2022 1409   TRIG 516 (H) 12/21/2022 1409   HDL 33 (L) 12/21/2022 1409   CHOLHDL 6.3 (H) 12/21/2022 1409   LDLCALC 90 12/21/2022 1409    Clinical Atherosclerotic Cardiovascular Disease (ASCVD): No  The ASCVD Risk score (Arnett DK, et al., 2019) failed to calculate for the following reasons:   The 2019 ASCVD risk score is only valid for ages 62 to 28   A/P: Diabetes longstanding *** currently ***. Patient is *** able to verbalize appropriate hypoglycemia management plan. Medication adherence appears ***. Control is suboptimal due to ***. -{Meds adjust:18428} basal insulin *** Lantus/Basaglar/Semglee (insulin glargine) *** Tresiba (insulin degludec) from *** units to *** units daily in the morning. Patient will continue to titrate 1 unit every *** days if fasting blood sugar > '100mg'$ /dl until fasting blood sugars reach goal or next visit.  -{Meds adjust:18428} rapid insulin ***  Novolog (insulin aspart) *** Humalog (insulin lispro) from *** to ***.  -{Meds XX123456 GLP-1 *** Trulicity (dulaglutide) *** Ozempic (semaglutide) *** Mounjaro (tirzepatide) from *** mg to *** mg .  -{Meds adjust:18428} SGLT2-I ***  Farxiga (dapagliflozin) *** Jardiance (empagliflozin) 10 mg. Counseled on sick day rules. -{Meds adjust:18428} metformin ***.  -Patient educated on purpose, proper use, and potential adverse effects of ***.  -Extensively discussed pathophysiology of diabetes, recommended lifestyle interventions, dietary effects on blood sugar control.  -Counseled on s/sx of and management of hypoglycemia.  -Next A1c anticipated 02/2023.   ASCVD risk - primary ***secondary prevention in patient with diabetes. Last LDL is *** not at goal of <70 *** mg/dL. ASCVD risk factors include *** and 10-year ASCVD risk score of ***. {Desc; low/moderate/high:110033} intensity statin indicated.  -{Meds adjust:18428} ***statin *** mg.   Hypertension longstanding *** currently ***. Blood pressure goal of <130/80 *** mmHg. Medication adherence ***. Blood pressure control is suboptimal due to ***. -{Meds adjust:18428} *** mg.  Written patient instructions provided. Patient verbalized understanding of treatment plan.  Total time in face to face counseling *** minutes.    Follow-up:  Pharmacist ***. PCP clinic visit in 04/30/2023  Maryan Puls, PharmD PGY-1 [facility] Pharmacy Resident

## 2023-01-31 ENCOUNTER — Ambulatory Visit: Payer: Self-pay | Admitting: Pharmacist

## 2023-02-20 ENCOUNTER — Ambulatory Visit: Payer: Self-pay | Admitting: Critical Care Medicine

## 2023-04-28 NOTE — Progress Notes (Deleted)
Established Patient Office Visit  Subjective   Patient ID: Anthony Hubbard, male    DOB: [birth year]  Age: 37 y.o. MRN: [id number]   History of Present Illness:   Trouble focusing  HPI: 06/2022 This is a fit 37 year old male who is seen by way of a video visit he is seen resting comfortably at his workplace no one else is in the room he is having progressive increase in difficulty focusing on task at work he does not denies any prior known history of ADHD he was raised in a conservative Hispanic family who did not believe in mental health services.  The patient now lives in [place] but he will be back in [place] in a week.  He is willing to go to [facility] behavioral health for evaluations when he goes back to this area.  He states his blood sugars often are 70-80 before eating and after eating 200-2 50.  He has been compliant with his insulin program.  01/08/23 Patient returns in follow-up last seen in August 2023 by way of a video visit.  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  On arrival A1c per clinical pharmacist last week was 9.6 LDL is 91 he needs a urine albumin and a follow-up metabolic panel he also needs a foot exam today.  Patient ran out of all of his medications and resumed insulin a week ago.  He has not been taking blood pressure medicine and blood pressure on arrival is elevated 186/120 Patient also has right-sided flank pain and has had potential kidney stone transition he has no insurance will have to see urology with patient assistance Below is documentation from clinical pharmacist Saw Anthony Hubbard 11/2022  back in [place] Patient arrives good spirits.Presents for diabetes evaluation, education, and management. Patient was referred and last seen by Primary Care Provider on 07/10/2022. I last saw him 08/21/2022. Since that visit, Anthony Hubbard moved to [place], [place], for his job. He spent ~October to  December in [place]. He moved back to [place] in December as his job relocated him. Unfortunately, he lost his insurance at that time and has been without ALL medications for about a month.    He reports that he is doing so much better from a mental health standpoint. Zoloft worked well for him. He also established primary care in [facility]) and was started on Vyvanse for ADHD. Since then, he has experienced success at his job and improvement in his health overall. In fact, he tells me today that his BP was managed on lisinopril alone during this time. Additionally, he wonders if we can refill his Vyvanse until he can be referred to Neurology here.    Today, he confirms that he's been without medication and has a couple of concerns. He was hospitalized 11/12 - 10/12/2022 with UTI complicated by obstructive left nephrolithiasis. He is requesting medication for some residual back pain.    Family/Social History:  -Fhx: DM (father) -Tobacco: never smoker -Alcohol: denies alcohol intake.   Insurance coverage/medication affordability: self-pay              Patient denies adherence with medications. Ran out of them all ~1 month ago.   Diabetes medications: we have Basaglar 44u daily, Humalog 12u BID, and Trulicity listed in his profile. He was taking Basaglar 40u daily + Ozempic 2mg  once a week with good control under [facility]. Tells me when he was on these, his CBG at home stayed in the 100s.  HTN medications we have on file: amlodipine 10 mg daily, carvedilol 25 mg BID, valsartan-HCTZ 320-25 mg daily. Patient was only taking 10 mg of lisinopril daily when under the care of [facility]. He tells me his BP was well-controlled on lisinopril only.   Patient denies hypoglycemic events.    Patient reported dietary habits:  -Tries to remain dietary compliant but admits to occasional dietary indiscretion    Patient-reported exercise habits:  -None  outside of work    Patient denies nocturia.  Patient denies neuropathy. Patient denies visual changes. Patient reports self foot exams.    Patient reported home CBGs:  Not checking since running out of medications.    O:  Recent Labs       Lab Results  Component Value Date    HGBA1C 9.9 (A) 03/28/2022      Clinical ASCVD: No  The ASCVD Risk score Denman George DC Jr., et al., 2013) failed to calculate for the following reasons:   The 2013 ASCVD risk score is only valid for ages 54 to 22    A/P: Diabetes longstanding w/ current degree of control unknown. Will get an updated A1c today. Will have him resume Basaglar and Ozempic as he was taking these with good control previously. Will order Humalog to have on hand in case he needs it. Patient is able to verbalize appropriate hypoglycemia management plan.  -Restart Basaglar 40 units daily as prescribed.   -Restart Ozempic 2 mg once weekly. Patient directed to the pharmacy to complete his patient assistance application.  -Humalog 10u TID before meals ordered for him to have on hand.  -Extensively discussed pathophysiology of DM, recommended lifestyle interventions, dietary effects on glycemic control -Counseled on s/sx of and management of hypoglycemia -A1c, CMP14+eGFR, lipid   HTN: longstanding currently above goal but he has been without medications. BP goal is <130/80 mmHg. He maintained good control with lisinopril alone since his anxiety has been controlled. Will restart lisinopril only today.  -Restart lisinopril 10 mg daily.  -CMP14+eGFR   Review of Systems  Constitutional:  Negative for chills, diaphoresis, fever, malaise/fatigue and weight loss.  HENT:  Negative for congestion, ear discharge, ear pain, hearing loss, nosebleeds, sore throat and tinnitus.   Eyes:  Negative for blurred vision, double vision, photophobia and discharge.  Respiratory:  Negative for cough, hemoptysis, sputum production, shortness of breath, wheezing and  stridor.        No excess mucus  Cardiovascular:  Negative for chest pain, palpitations, orthopnea, claudication, leg swelling and PND.  Gastrointestinal:  Negative for abdominal pain, blood in stool, constipation, diarrhea, heartburn, melena, nausea and vomiting.  Genitourinary:  Negative for dysuria, flank pain, frequency, hematuria and urgency.  Musculoskeletal:  Negative for back pain, falls, joint pain, myalgias and neck pain.  Skin:  Negative for itching and rash.  Neurological:  Negative for dizziness, tingling, tremors, sensory change, speech change, focal weakness, seizures, loss of consciousness, weakness and headaches.  Endo/Heme/Allergies:  Negative for environmental allergies and polydipsia. Does not bruise/bleed easily.  Psychiatric/Behavioral:  Negative for depression, hallucinations, memory loss, substance abuse and suicidal ideas. The patient is nervous/anxious. The patient does not have insomnia.        Lack of focus  All other systems reviewed and are negative.     Objective:     There were no vitals taken for this visit.   Physical Exam Vitals  reviewed.  Constitutional:      Appearance: Normal appearance. He is well-developed. He is obese. He is not diaphoretic.  HENT:     Head: Normocephalic and atraumatic.     Nose: No nasal deformity, septal deviation, mucosal edema or rhinorrhea.     Right Sinus: No maxillary sinus tenderness or frontal sinus tenderness.     Left Sinus: No maxillary sinus tenderness or frontal sinus tenderness.     Mouth/Throat:     Pharynx: No oropharyngeal exudate.  Eyes:     General: No scleral icterus.    Conjunctiva/sclera: Conjunctivae normal.     Pupils: Pupils are equal, round, and reactive to light.  Neck:     Thyroid: No thyromegaly.     Vascular: No carotid bruit or JVD.     Trachea: Trachea normal. No tracheal tenderness or tracheal deviation.  Cardiovascular:     Rate and Rhythm: Normal rate and regular rhythm.     Chest  Wall: PMI is not displaced.     Pulses: Normal pulses. No decreased pulses.     Heart sounds: Normal heart sounds, S1 normal and S2 normal. Heart sounds not distant. No murmur heard.    No systolic murmur is present.     No diastolic murmur is present.     No friction rub. No gallop. No S3 or S4 sounds.  Pulmonary:     Effort: No tachypnea, accessory muscle usage or respiratory distress.     Breath sounds: No stridor. No decreased breath sounds, wheezing, rhonchi or rales.  Chest:     Chest wall: No tenderness.  Abdominal:     General: Bowel sounds are normal. There is no distension.     Palpations: Abdomen is soft. Abdomen is not rigid.     Tenderness: There is no abdominal tenderness. There is no guarding or rebound.  Musculoskeletal:        General: Normal range of motion.     Cervical back: Normal range of motion and neck supple. No edema, erythema or rigidity. No muscular tenderness. Normal range of motion.     Comments: Foot exam normal except for mild onychomycosis  Lymphadenopathy:     Head:     Right side of head: No submental or submandibular adenopathy.     Left side of head: No submental or submandibular adenopathy.     Cervical: No cervical adenopathy.  Skin:    General: Skin is warm and dry.     Coloration: Skin is not pale.     Findings: No rash.     Nails: There is no clubbing.  Neurological:     Mental Status: He is alert and oriented to person, place, and time.     Sensory: No sensory deficit.  Psychiatric:        Speech: Speech normal.        Behavior: Behavior normal.     No exam this is a video visit No results found for any visits on 04/30/23.    The ASCVD Risk score (Arnett DK, et al., 2019) failed to calculate for the following reasons:   The 2019 ASCVD risk score is only valid for ages 61 to 87    Assessment & Plan:   Problem List Items Addressed This Visit   None 35 minutes spent extra time needed for multisystem assessments Shan Levans, MD

## 2023-04-30 ENCOUNTER — Ambulatory Visit: Payer: Self-pay | Admitting: Critical Care Medicine
